# Patient Record
Sex: Female | Born: 1965 | Race: White | Hispanic: No | Smoking: Never smoker
Health system: Southern US, Community
[De-identification: ages and names within clinical notes are randomized; demographics above are authoritative.]

## PROBLEM LIST (undated history)

## (undated) DIAGNOSIS — M810 Age-related osteoporosis without current pathological fracture: Secondary | ICD-10-CM

## (undated) DIAGNOSIS — S069X9A Unspecified intracranial injury with loss of consciousness of unspecified duration, initial encounter: Secondary | ICD-10-CM

## (undated) DIAGNOSIS — Z915 Personal history of self-harm: Secondary | ICD-10-CM

## (undated) DIAGNOSIS — F419 Anxiety disorder, unspecified: Secondary | ICD-10-CM

## (undated) DIAGNOSIS — Z9289 Personal history of other medical treatment: Secondary | ICD-10-CM

## (undated) DIAGNOSIS — F32A Depression, unspecified: Secondary | ICD-10-CM

## (undated) DIAGNOSIS — F319 Bipolar disorder, unspecified: Secondary | ICD-10-CM

## (undated) DIAGNOSIS — N189 Chronic kidney disease, unspecified: Secondary | ICD-10-CM

## (undated) DIAGNOSIS — S069XAA Unspecified intracranial injury with loss of consciousness status unknown, initial encounter: Secondary | ICD-10-CM

## (undated) DIAGNOSIS — F329 Major depressive disorder, single episode, unspecified: Secondary | ICD-10-CM

## (undated) DIAGNOSIS — I669 Occlusion and stenosis of unspecified cerebral artery: Secondary | ICD-10-CM

## (undated) HISTORY — PX: DILATION AND CURETTAGE OF UTERUS: SHX78

## (undated) HISTORY — PX: RHINOPLASTY: SUR1284

## (undated) HISTORY — PX: OTHER SURGICAL HISTORY: SHX169

## (undated) HISTORY — DX: Age-related osteoporosis without current pathological fracture: M81.0

## (undated) HISTORY — PX: SEPTOPLASTY: SUR1290

## (undated) HISTORY — PX: COLONOSCOPY W/ POLYPECTOMY: SHX1380

---

## 1999-05-01 ENCOUNTER — Inpatient Hospital Stay (HOSPITAL_COMMUNITY): Admission: AD | Admit: 1999-05-01 | Discharge: 1999-05-01 | Payer: Self-pay | Admitting: Obstetrics & Gynecology

## 1999-06-04 ENCOUNTER — Inpatient Hospital Stay (HOSPITAL_COMMUNITY): Admission: AD | Admit: 1999-06-04 | Discharge: 1999-06-04 | Payer: Self-pay | Admitting: Obstetrics & Gynecology

## 1999-06-17 ENCOUNTER — Inpatient Hospital Stay (HOSPITAL_COMMUNITY): Admission: AD | Admit: 1999-06-17 | Discharge: 1999-06-19 | Payer: Self-pay | Admitting: *Deleted

## 1999-09-05 ENCOUNTER — Other Ambulatory Visit: Admission: RE | Admit: 1999-09-05 | Discharge: 1999-09-05 | Payer: Self-pay | Admitting: Obstetrics & Gynecology

## 2000-09-04 ENCOUNTER — Other Ambulatory Visit: Admission: RE | Admit: 2000-09-04 | Discharge: 2000-09-04 | Payer: Self-pay | Admitting: Obstetrics & Gynecology

## 2002-01-08 ENCOUNTER — Other Ambulatory Visit: Admission: RE | Admit: 2002-01-08 | Discharge: 2002-01-08 | Payer: Self-pay | Admitting: Obstetrics & Gynecology

## 2003-02-04 ENCOUNTER — Other Ambulatory Visit: Admission: RE | Admit: 2003-02-04 | Discharge: 2003-02-04 | Payer: Self-pay | Admitting: Obstetrics & Gynecology

## 2003-12-14 ENCOUNTER — Inpatient Hospital Stay (HOSPITAL_COMMUNITY): Admission: AD | Admit: 2003-12-14 | Discharge: 2003-12-14 | Payer: Self-pay | Admitting: Obstetrics and Gynecology

## 2003-12-21 ENCOUNTER — Ambulatory Visit (HOSPITAL_COMMUNITY): Admission: RE | Admit: 2003-12-21 | Discharge: 2003-12-21 | Payer: Self-pay | Admitting: Obstetrics & Gynecology

## 2003-12-21 ENCOUNTER — Encounter (INDEPENDENT_AMBULATORY_CARE_PROVIDER_SITE_OTHER): Payer: Self-pay | Admitting: Specialist

## 2004-03-29 ENCOUNTER — Ambulatory Visit (HOSPITAL_COMMUNITY): Admission: RE | Admit: 2004-03-29 | Discharge: 2004-03-29 | Payer: Self-pay | Admitting: Chiropractic Medicine

## 2005-09-27 ENCOUNTER — Encounter: Admission: RE | Admit: 2005-09-27 | Discharge: 2005-09-27 | Payer: Self-pay | Admitting: Obstetrics & Gynecology

## 2007-06-27 DIAGNOSIS — Z9151 Personal history of suicidal behavior: Secondary | ICD-10-CM

## 2007-06-27 HISTORY — DX: Personal history of suicidal behavior: Z91.51

## 2008-05-25 ENCOUNTER — Encounter (INDEPENDENT_AMBULATORY_CARE_PROVIDER_SITE_OTHER): Payer: Self-pay | Admitting: Obstetrics & Gynecology

## 2008-05-25 ENCOUNTER — Ambulatory Visit (HOSPITAL_COMMUNITY): Admission: RE | Admit: 2008-05-25 | Discharge: 2008-05-25 | Payer: Self-pay | Admitting: Obstetrics & Gynecology

## 2009-05-25 ENCOUNTER — Observation Stay (HOSPITAL_COMMUNITY): Admission: EM | Admit: 2009-05-25 | Discharge: 2009-05-28 | Payer: Self-pay | Admitting: Emergency Medicine

## 2009-05-26 ENCOUNTER — Encounter (INDEPENDENT_AMBULATORY_CARE_PROVIDER_SITE_OTHER): Payer: Self-pay | Admitting: Internal Medicine

## 2009-05-28 ENCOUNTER — Ambulatory Visit (HOSPITAL_COMMUNITY): Admission: RE | Admit: 2009-05-28 | Discharge: 2009-05-28 | Payer: Self-pay | Admitting: Psychiatry

## 2009-06-01 ENCOUNTER — Other Ambulatory Visit (HOSPITAL_COMMUNITY): Admission: RE | Admit: 2009-06-01 | Discharge: 2009-06-09 | Payer: Self-pay | Admitting: Psychiatry

## 2009-06-06 ENCOUNTER — Inpatient Hospital Stay (HOSPITAL_COMMUNITY): Admission: EM | Admit: 2009-06-06 | Discharge: 2009-06-10 | Payer: Self-pay | Admitting: Emergency Medicine

## 2009-06-06 ENCOUNTER — Ambulatory Visit: Payer: Self-pay | Admitting: Internal Medicine

## 2009-06-10 ENCOUNTER — Inpatient Hospital Stay (HOSPITAL_COMMUNITY): Admission: AD | Admit: 2009-06-10 | Discharge: 2009-06-12 | Payer: Self-pay | Admitting: Psychiatry

## 2009-06-10 ENCOUNTER — Ambulatory Visit: Payer: Self-pay | Admitting: Psychiatry

## 2010-06-01 ENCOUNTER — Emergency Department (HOSPITAL_COMMUNITY)
Admission: EM | Admit: 2010-06-01 | Discharge: 2010-06-02 | Disposition: A | Discharge status: Other Acute Inpatient Hospital | Payer: Self-pay | Source: Home / Self Care | Admitting: Emergency Medicine

## 2010-06-01 ENCOUNTER — Ambulatory Visit (HOSPITAL_COMMUNITY)
Admission: RE | Admit: 2010-06-01 | Discharge: 2010-06-01 | Payer: Self-pay | Source: Home / Self Care | Attending: Psychiatry | Admitting: Psychiatry

## 2010-06-02 ENCOUNTER — Inpatient Hospital Stay (HOSPITAL_COMMUNITY)
Admission: RE | Admit: 2010-06-02 | Discharge: 2010-06-05 | Payer: Self-pay | Source: Home / Self Care | Attending: Psychiatry | Admitting: Psychiatry

## 2010-09-06 LAB — URINE MICROSCOPIC-ADD ON

## 2010-09-06 LAB — COMPREHENSIVE METABOLIC PANEL
ALT: 17 U/L (ref 0–35)
AST: 22 U/L (ref 0–37)
Albumin: 4.4 g/dL (ref 3.5–5.2)
Alkaline Phosphatase: 89 U/L (ref 39–117)
BUN: 5 mg/dL — ABNORMAL LOW (ref 6–23)
CO2: 30 mEq/L (ref 19–32)
Calcium: 9.4 mg/dL (ref 8.4–10.5)
Chloride: 103 mEq/L (ref 96–112)
Creatinine, Ser: 0.86 mg/dL (ref 0.4–1.2)
GFR calc Af Amer: >60 mL/min (ref >60)
GFR calc non Af Amer: >60 mL/min (ref >60)
Glucose, Bld: 85 mg/dL (ref 70–99)
Potassium: 3.5 mEq/L (ref 3.5–5.1)
Sodium: 142 mEq/L (ref 135–145)
Total Bilirubin: 0.5 mg/dL (ref 0.3–1.2)
Total Protein: 7.5 g/dL (ref 6.0–8.3)

## 2010-09-06 LAB — URINALYSIS, ROUTINE W REFLEX MICROSCOPIC
Bilirubin Urine: NEGATIVE
Glucose, UA: NEGATIVE mg/dL
Ketones, ur: NEGATIVE mg/dL
Nitrite: NEGATIVE
Protein, ur: NEGATIVE mg/dL
Specific Gravity, Urine: 1.004 — ABNORMAL LOW (ref 1.005–1.030)
Urobilinogen, UA: 0.2 mg/dL (ref 0.0–1.0)
pH: 6 (ref 5.0–8.0)

## 2010-09-06 LAB — DIFFERENTIAL
Basophils Absolute: 0.1 10*3/uL (ref 0.0–0.1)
Basophils Relative: 1 % (ref 0–1)
Eosinophils Absolute: 0.1 10*3/uL (ref 0.0–0.7)
Eosinophils Relative: 1 % (ref 0–5)
Lymphocytes Relative: 31 % (ref 12–46)
Lymphs Abs: 2.7 10*3/uL (ref 0.7–4.0)
Monocytes Absolute: 0.6 10*3/uL (ref 0.1–1.0)
Monocytes Relative: 7 % (ref 3–12)
Neutro Abs: 5.2 10*3/uL (ref 1.7–7.7)
Neutrophils Relative %: 60 % (ref 43–77)

## 2010-09-06 LAB — ETHANOL: Alcohol, Ethyl (B): 6 mg/dL (ref 0–10)

## 2010-09-06 LAB — SALICYLATE LEVEL: Salicylate Lvl: <4 mg/dL (ref 2.8–20.0)

## 2010-09-06 LAB — CBC
HCT: 38.1 % (ref 36.0–46.0)
Hemoglobin: 12.9 g/dL (ref 12.0–15.0)
MCH: 29.9 pg (ref 26.0–34.0)
MCHC: 33.9 g/dL (ref 30.0–36.0)
MCV: 88.2 fL (ref 78.0–100.0)
Platelets: 242 10*3/uL (ref 150–400)
RBC: 4.32 MIL/uL (ref 3.87–5.11)
RDW: 13.4 % (ref 11.5–15.5)
WBC: 8.7 10*3/uL (ref 4.0–10.5)

## 2010-09-06 LAB — RAPID URINE DRUG SCREEN, HOSP PERFORMED
Amphetamines: NOT DETECTED
Barbiturates: NOT DETECTED
Benzodiazepines: POSITIVE — AB
Cocaine: NOT DETECTED
Opiates: NOT DETECTED
Tetrahydrocannabinol: NOT DETECTED

## 2010-09-06 LAB — TRICYCLICS SCREEN, URINE: TCA Scrn: NOT DETECTED

## 2010-09-06 LAB — ACETAMINOPHEN LEVEL: Acetaminophen (Tylenol), Serum: <10 ug/mL — ABNORMAL LOW (ref 10–30)

## 2010-09-06 LAB — PREGNANCY, URINE: Preg Test, Ur: NEGATIVE

## 2010-09-26 LAB — TSH: TSH: 1.127 u[IU]/mL (ref 0.350–4.500)

## 2010-09-27 LAB — PROTIME-INR
INR: 1.04 (ref 0.00–1.49)
Prothrombin Time: 13.5 seconds (ref 11.6–15.2)

## 2010-09-27 LAB — CBC
HCT: 32.4 % — ABNORMAL LOW (ref 36.0–46.0)
HCT: 37.5 % (ref 36.0–46.0)
Hemoglobin: 10.8 g/dL — ABNORMAL LOW (ref 12.0–15.0)
Hemoglobin: 12.4 g/dL (ref 12.0–15.0)
MCHC: 33.2 g/dL (ref 30.0–36.0)
MCHC: 33.3 g/dL (ref 30.0–36.0)
MCV: 87.9 fL (ref 78.0–100.0)
MCV: 88 fL (ref 78.0–100.0)
Platelets: 175 10*3/uL (ref 150–400)
Platelets: 181 10*3/uL (ref 150–400)
RBC: 3.69 MIL/uL — ABNORMAL LOW (ref 3.87–5.11)
RBC: 4.26 MIL/uL (ref 3.87–5.11)
RDW: 14.2 % (ref 11.5–15.5)
RDW: 14.3 % (ref 11.5–15.5)
WBC: 10.5 10*3/uL (ref 4.0–10.5)
WBC: 6.2 10*3/uL (ref 4.0–10.5)

## 2010-09-27 LAB — RAPID URINE DRUG SCREEN, HOSP PERFORMED
Amphetamines: NOT DETECTED
Barbiturates: NOT DETECTED
Benzodiazepines: POSITIVE — AB
Cocaine: NOT DETECTED
Opiates: NOT DETECTED
Tetrahydrocannabinol: NOT DETECTED

## 2010-09-27 LAB — DIFFERENTIAL
Basophils Absolute: 0 10*3/uL (ref 0.0–0.1)
Basophils Relative: 0 % (ref 0–1)
Eosinophils Absolute: 0.1 10*3/uL (ref 0.0–0.7)
Eosinophils Relative: 2 % (ref 0–5)
Lymphocytes Relative: 40 % (ref 12–46)
Lymphs Abs: 2.5 10*3/uL (ref 0.7–4.0)
Monocytes Absolute: 0.4 10*3/uL (ref 0.1–1.0)
Monocytes Relative: 7 % (ref 3–12)
Neutro Abs: 3.2 10*3/uL (ref 1.7–7.7)
Neutrophils Relative %: 51 % (ref 43–77)

## 2010-09-27 LAB — BLOOD GAS, ARTERIAL
Acid-Base Excess: 0.1 mmol/L (ref 0.0–2.0)
Acid-Base Excess: 0.5 mmol/L (ref 0.0–2.0)
Bicarbonate: 23.3 mEq/L (ref 20.0–24.0)
Bicarbonate: 23.7 mEq/L (ref 20.0–24.0)
Drawn by: 129801
FIO2: 0.3 %
FIO2: 0.4 %
MECHVT: 450 mL
MECHVT: 500 mL
O2 Saturation: 97.1 %
O2 Saturation: 97.5 %
PEEP: 5 cmH2O
PEEP: 5 cmH2O
Patient temperature: 102
Patient temperature: 98.6
RATE: 14 resp/min
RATE: 15 resp/min
TCO2: 20.5 mmol/L (ref 0–100)
TCO2: 21.3 mmol/L (ref 0–100)
pCO2 arterial: 33.1 mmHg — ABNORMAL LOW (ref 35.0–45.0)
pCO2 arterial: 40.1 mmHg (ref 35.0–45.0)
pH, Arterial: 7.399 (ref 7.350–7.400)
pH, Arterial: 7.461 — ABNORMAL HIGH (ref 7.350–7.400)
pO2, Arterial: 132 mmHg — ABNORMAL HIGH (ref 80.0–100.0)
pO2, Arterial: 144 mmHg — ABNORMAL HIGH (ref 80.0–100.0)

## 2010-09-27 LAB — URINE CULTURE
Colony Count: NO GROWTH
Culture: NO GROWTH
Special Requests: NEGATIVE

## 2010-09-27 LAB — ACTH STIMULATION, 3 TIME POINTS
Cortisol, 30 Min: 20.3 ug/dL (ref >20)
Cortisol, 60 Min: 23.4 ug/dL (ref >20)
Cortisol, Base: 1.1 ug/dL

## 2010-09-27 LAB — HSV PCR
HSV 2 , PCR: NOT DETECTED
HSV, PCR: NOT DETECTED
Specimen Source-HSVPCR: NO GROWTH

## 2010-09-27 LAB — COMPREHENSIVE METABOLIC PANEL
ALT: 26 U/L (ref 0–35)
ALT: 35 U/L (ref 0–35)
AST: 24 U/L (ref 0–37)
AST: 27 U/L (ref 0–37)
Albumin: 3.5 g/dL (ref 3.5–5.2)
Albumin: 4.1 g/dL (ref 3.5–5.2)
Alkaline Phosphatase: 65 U/L (ref 39–117)
Alkaline Phosphatase: 86 U/L (ref 39–117)
BUN: 5 mg/dL — ABNORMAL LOW (ref 6–23)
BUN: 5 mg/dL — ABNORMAL LOW (ref 6–23)
CO2: 27 mEq/L (ref 19–32)
CO2: 28 mEq/L (ref 19–32)
Calcium: 8.6 mg/dL (ref 8.4–10.5)
Calcium: 8.7 mg/dL (ref 8.4–10.5)
Chloride: 105 mEq/L (ref 96–112)
Chloride: 108 mEq/L (ref 96–112)
Creatinine, Ser: 0.62 mg/dL (ref 0.4–1.2)
Creatinine, Ser: 0.71 mg/dL (ref 0.4–1.2)
GFR calc Af Amer: >60 mL/min (ref >60)
GFR calc Af Amer: >60 mL/min (ref >60)
GFR calc non Af Amer: >60 mL/min (ref >60)
GFR calc non Af Amer: >60 mL/min (ref >60)
Glucose, Bld: 121 mg/dL — ABNORMAL HIGH (ref 70–99)
Glucose, Bld: 75 mg/dL (ref 70–99)
Potassium: 2.8 mEq/L — ABNORMAL LOW (ref 3.5–5.1)
Potassium: 3.6 mEq/L (ref 3.5–5.1)
Sodium: 138 mEq/L (ref 135–145)
Sodium: 141 mEq/L (ref 135–145)
Total Bilirubin: 0.5 mg/dL (ref 0.3–1.2)
Total Bilirubin: 0.8 mg/dL (ref 0.3–1.2)
Total Protein: 6.9 g/dL (ref 6.0–8.3)
Total Protein: 7.3 g/dL (ref 6.0–8.3)

## 2010-09-27 LAB — CULTURE, BLOOD (ROUTINE X 2)
Culture: NO GROWTH
Culture: NO GROWTH

## 2010-09-27 LAB — URINALYSIS, ROUTINE W REFLEX MICROSCOPIC
Bilirubin Urine: NEGATIVE
Glucose, UA: NEGATIVE mg/dL
Ketones, ur: NEGATIVE mg/dL
Leukocytes, UA: NEGATIVE
Nitrite: NEGATIVE
Protein, ur: NEGATIVE mg/dL
Specific Gravity, Urine: 1.01 (ref 1.005–1.030)
Urobilinogen, UA: 0.2 mg/dL (ref 0.0–1.0)
pH: 7.5 (ref 5.0–8.0)

## 2010-09-27 LAB — ETHANOL: Alcohol, Ethyl (B): <5 mg/dL (ref 0–10)

## 2010-09-27 LAB — CSF CULTURE
Culture: NO GROWTH
Gram Stain: NONE SEEN

## 2010-09-27 LAB — CSF CULTURE W GRAM STAIN

## 2010-09-27 LAB — POCT CARDIAC MARKERS
CKMB, poc: <1 ng/mL — ABNORMAL LOW (ref 1.0–8.0)
Myoglobin, poc: 62.3 ng/mL (ref 12–200)
Troponin i, poc: <0.05 ng/mL (ref 0.00–0.09)

## 2010-09-27 LAB — CSF CELL COUNT WITH DIFFERENTIAL
RBC Count, CSF: 1 /mm3 — ABNORMAL HIGH
Tube #: 1
WBC, CSF: 1 /mm3 (ref 0–5)

## 2010-09-27 LAB — BASIC METABOLIC PANEL
BUN: 5 mg/dL — ABNORMAL LOW (ref 6–23)
CO2: 29 mEq/L (ref 19–32)
Calcium: 8.7 mg/dL (ref 8.4–10.5)
Chloride: 103 mEq/L (ref 96–112)
Creatinine, Ser: 0.65 mg/dL (ref 0.4–1.2)
GFR calc Af Amer: >60 mL/min (ref >60)
GFR calc non Af Amer: >60 mL/min (ref >60)
Glucose, Bld: 122 mg/dL — ABNORMAL HIGH (ref 70–99)
Potassium: 3 mEq/L — ABNORMAL LOW (ref 3.5–5.1)
Sodium: 138 mEq/L (ref 135–145)

## 2010-09-27 LAB — PROTEIN AND GLUCOSE, CSF
Glucose, CSF: 55 mg/dL (ref 43–76)
Total  Protein, CSF: 49 mg/dL — ABNORMAL HIGH (ref 15–45)

## 2010-09-27 LAB — ACETAMINOPHEN LEVEL
Acetaminophen (Tylenol), Serum: 12.7 ug/mL (ref 10–30)
Acetaminophen (Tylenol), Serum: 21.2 ug/mL (ref 10–30)
Acetaminophen (Tylenol), Serum: 33 ug/mL — ABNORMAL HIGH (ref 10–30)

## 2010-09-27 LAB — AMMONIA: Ammonia: 17 umol/L (ref 11–35)

## 2010-09-27 LAB — SALICYLATE LEVEL: Salicylate Lvl: <4 mg/dL (ref 2.8–20.0)

## 2010-09-27 LAB — APTT: aPTT: 31 seconds (ref 24–37)

## 2010-09-27 LAB — CARDIAC PANEL(CRET KIN+CKTOT+MB+TROPI)
CK, MB: 0.7 ng/mL (ref 0.3–4.0)
Relative Index: INVALID (ref 0.0–2.5)
Total CK: 65 U/L (ref 7–177)
Troponin I: <0.01 ng/mL (ref 0.00–0.06)

## 2010-09-27 LAB — GLUCOSE, CAPILLARY: Glucose-Capillary: 97 mg/dL (ref 70–99)

## 2010-09-27 LAB — URINE MICROSCOPIC-ADD ON

## 2010-09-27 LAB — MAGNESIUM: Magnesium: 2.1 mg/dL (ref 1.5–2.5)

## 2010-09-27 LAB — PREGNANCY, URINE: Preg Test, Ur: NEGATIVE

## 2010-09-27 LAB — VDRL, CSF: VDRL Quant, CSF: NONREACTIVE

## 2010-09-27 LAB — HSV(HERPES SMPLX VRS)ABS-I+II(IGG)-CSF

## 2010-09-28 LAB — COMPREHENSIVE METABOLIC PANEL
ALT: 18 U/L (ref 0–35)
AST: 25 U/L (ref 0–37)
Albumin: 4.1 g/dL (ref 3.5–5.2)
Alkaline Phosphatase: 87 U/L (ref 39–117)
BUN: 5 mg/dL — ABNORMAL LOW (ref 6–23)
CO2: 28 mEq/L (ref 19–32)
Calcium: 9.1 mg/dL (ref 8.4–10.5)
Chloride: 104 mEq/L (ref 96–112)
Creatinine, Ser: 0.66 mg/dL (ref 0.4–1.2)
GFR calc Af Amer: >60 mL/min (ref >60)
GFR calc non Af Amer: >60 mL/min (ref >60)
Glucose, Bld: 105 mg/dL — ABNORMAL HIGH (ref 70–99)
Potassium: 3.7 mEq/L (ref 3.5–5.1)
Sodium: 138 mEq/L (ref 135–145)
Total Bilirubin: 0.5 mg/dL (ref 0.3–1.2)
Total Protein: 7.2 g/dL (ref 6.0–8.3)

## 2010-09-28 LAB — DIFFERENTIAL
Basophils Absolute: 0 10*3/uL (ref 0.0–0.1)
Basophils Relative: 0 % (ref 0–1)
Eosinophils Absolute: 0.1 10*3/uL (ref 0.0–0.7)
Eosinophils Relative: 3 % (ref 0–5)
Lymphocytes Relative: 42 % (ref 12–46)
Lymphs Abs: 1.9 10*3/uL (ref 0.7–4.0)
Monocytes Absolute: 0.3 10*3/uL (ref 0.1–1.0)
Monocytes Relative: 7 % (ref 3–12)
Neutro Abs: 2.2 10*3/uL (ref 1.7–7.7)
Neutrophils Relative %: 49 % (ref 43–77)

## 2010-09-28 LAB — CBC
HCT: 36.2 % (ref 36.0–46.0)
Hemoglobin: 12.3 g/dL (ref 12.0–15.0)
MCHC: 33.9 g/dL (ref 30.0–36.0)
MCV: 87.8 fL (ref 78.0–100.0)
Platelets: 215 10*3/uL (ref 150–400)
RBC: 4.13 MIL/uL (ref 3.87–5.11)
RDW: 14.1 % (ref 11.5–15.5)
WBC: 4.6 10*3/uL (ref 4.0–10.5)

## 2010-09-28 LAB — URINALYSIS, ROUTINE W REFLEX MICROSCOPIC
Bilirubin Urine: NEGATIVE
Glucose, UA: NEGATIVE mg/dL
Ketones, ur: NEGATIVE mg/dL
Nitrite: NEGATIVE
Protein, ur: NEGATIVE mg/dL
Specific Gravity, Urine: 1.008 (ref 1.005–1.030)
Urobilinogen, UA: 0.2 mg/dL (ref 0.0–1.0)
pH: 8 (ref 5.0–8.0)

## 2010-09-28 LAB — TSH: TSH: 1.444 u[IU]/mL (ref 0.350–4.500)

## 2010-09-28 LAB — RAPID URINE DRUG SCREEN, HOSP PERFORMED
Amphetamines: NOT DETECTED
Barbiturates: NOT DETECTED
Benzodiazepines: POSITIVE — AB
Cocaine: NOT DETECTED
Opiates: NOT DETECTED
Tetrahydrocannabinol: NOT DETECTED

## 2010-09-28 LAB — CARDIAC PANEL(CRET KIN+CKTOT+MB+TROPI)
CK, MB: 0.7 ng/mL (ref 0.3–4.0)
Relative Index: INVALID (ref 0.0–2.5)
Total CK: 69 U/L (ref 7–177)
Troponin I: 0.02 ng/mL (ref 0.00–0.06)

## 2010-09-28 LAB — CORTISOL: Cortisol, Plasma: 3.6 ug/dL

## 2010-09-28 LAB — URINE MICROSCOPIC-ADD ON

## 2010-09-28 LAB — CARBAMAZEPINE LEVEL, TOTAL: Carbamazepine Lvl: <2 ug/mL — ABNORMAL LOW (ref 4.0–12.0)

## 2010-09-28 LAB — VITAMIN D 1,25 DIHYDROXY
Vitamin D 1, 25 (OH)2 Total: 60 pg/mL (ref 18–72)
Vitamin D2 1, 25 (OH)2: <8 pg/mL
Vitamin D3 1, 25 (OH)2: 60 pg/mL

## 2010-09-28 LAB — POCT CARDIAC MARKERS
CKMB, poc: <1 ng/mL — ABNORMAL LOW (ref 1.0–8.0)
Myoglobin, poc: 59.4 ng/mL (ref 12–200)
Troponin i, poc: <0.05 ng/mL (ref 0.00–0.09)

## 2010-09-28 LAB — ETHANOL: Alcohol, Ethyl (B): <5 mg/dL (ref 0–10)

## 2010-09-28 LAB — APTT: aPTT: 29 seconds (ref 24–37)

## 2010-09-28 LAB — PROTIME-INR
INR: 1.03 (ref 0.00–1.49)
Prothrombin Time: 13.4 seconds (ref 11.6–15.2)

## 2010-11-08 NOTE — Op Note (Signed)
Laurie Booker, Laurie Booker                ACCOUNT NO.:  [id number]   MEDICAL RECORD NO.:  [id number]          PATIENT TYPE:  AMB   LOCATION:                                FACILITY:  [facility]   PHYSICIAN:  Genia Del, M.D.DATE OF BIRTH:  [birth year]   DATE OF PROCEDURE:  DATE OF DISCHARGE:                               OPERATIVE REPORT   PREOPERATIVE DIAGNOSIS:  Menometrorrhagia refractory to Mirena  intrauterine device.   POSTOPERATIVE DIAGNOSIS:  Menometrorrhagia refractory to Mirena  intrauterine device.   PROCEDURE:  Mirena IUD removal, diagnostic hysteroscopy, dilatation and  curettage, NovaSure endometrial ablation.   SURGEON:  Genia Del, MD   No assistant.   PROCEDURE:  Under general anesthesia with endotracheal intubation, the  patient in lithotomy position, she was prepped with Betadine on the  suprapubic, vulvar, and vaginal areas and draped as usual.  The bladder  was catheterized.  The vaginal exam under general anesthesia reveals a  normal-size anteverted uterus, mobile, no adnexal mass.  The cervix was  long and closed.  The speculum was introduced in the vagina.  The  anterior lip of the cervix was grasped with a tenaculum.  The Mirena IUD  was pulled out easily and will be sent to pathology with the endometrial  curettings.  We proceeded with a paracervical block with Nesacaine 1%, a  total of 10 mL at 4 and 8 o'clock.  The measurements of the cervical  length is at 4 cm, the hysterometer 8 cm for an uterine cavity length of  4 cm.  We then proceeded with dilatation of the cervix with Hegar  dilators up to #31 without difficulty.  A diagnostic hysteroscopy was  done.  The hysteroscope was introduced in the intrauterine cavity.  No  lesions were seen.  We visualized both ostia well.  Pictures were taken.  The hysteroscope was then removed.  We proceeded with the curettage of  the endometrial cavity.  This was done with a sharp curette  systematically on all  intrauterine surfaces.  The specimen was sent to  pathology as mentioned previously with the Mirena IUD.  We then  introduced the NovaSure device in the intrauterine cavity.  We measured  the uterine width which is at 2.6 cm.  We then did the integrity test  which was passed and then proceed with the NovaSure endometrial  ablation.  A power of 57 for a duration of 1 minute 58 seconds was done.  The instrument was then removed easily.  The tenaculum was also removed.  Silver nitrate was used at that level to complete hemostasis.  The  speculum was then removed.  The fluid deficit was 100 mL.  The estimated  blood loss was minimal.  No complications occurred, and the patient was  brought to recovery room in good stable status.      Genia Del, M.D.  Electronically Signed     ML/MEDQ  D:  05/25/2008  T:  05/26/2008  Job:  [id number]

## 2010-11-11 NOTE — Op Note (Signed)
NAMETYERRA, LORETTO                            ACCOUNT NO.:  [id number]   MEDICAL RECORD NO.:  [id number]                   PATIENT TYPE:  AMB   LOCATION:  [facility]                                  FACILITY:  [facility]   PHYSICIAN:  Genia Del, M.D.             DATE OF BIRTH:  [birth year]   DATE OF PROCEDURE:  12/21/2003  DATE OF DISCHARGE:                                 OPERATIVE REPORT   PREOPERATIVE DIAGNOSES:  1. Menometrorrhagia with secondary anemia.  2. Probable intrauterine polyp.   POSTOPERATIVE DIAGNOSES:  1. Menometrorrhagia with secondary anemia.  2. Probable intrauterine polyp.   </INTERVENTION  Hysteroscopy with resection of endometrial polyp and dilatation and  curettage.   SURGEON:  Genia Del, M.D.   ANESTHESIOLOGIST:  Octaviano Glow. Pamalee Leyden, M.D.   PROCEDURE:  Under MAC analgesia, the patient is in lithotomy position.  She  is prepped with Hibiclens on the suprapubic, vulvar, and vaginal areas and  draped as usual.  The vaginal exam reveals an anteverted uterus, normal  volume, mobile, no adnexal mass.  The cervix is long and closed.  Old blood  is present, no active bleeding.  The speculum is introduced inside the  vagina, the anterior lip of the cervix is grasped with a tenaculum.  A  paracervical block is done with 10 mL of Nesacaine 1% at 4 o'clock and 10 mL  at 8 o'clock and then an additional 4 mL is injected in the cervix itself at  4 and 8 o'clock.  We then proceed with dilatation of the cervix at Hegar #25  easily.  A diagnostic hysteroscopy is then done.  Small polyps are seen on  the left side of the intrauterine cavity.  The diagnostic hysteroscope is  then removed.  Dilatation is completed up to dilator #31 without difficulty.  Then the resectoscope is introduced inside the intrauterine cavity with the  double loop.  We proceed with pictures of the intrauterine cavity and the  endometrial polyps.  We then resect them with the loop and send them to  pathology.  Pictures are taken after.  The cavity is regular and normal  after resection.  Hemostasis is adequate.  We remove the hysteroscope and  use a sharp curette to systematically curettage all intrauterine surfaces.  This specimen is sent to pathology.  Hemostasis is adequate.  The tenaculum  is removed on the anterior lip of the cervix.  Silver nitrate is used to  complete hemostasis at that level.  The rest of the instruments are removed.  The estimated blood loss was minimal.  Deficit was 40 mL.  No complication  occurred, and the patient was transferred to the recovery room in good  status.  Genia Del, M.D.    ML/MEDQ  D:  12/21/2003  T:  12/22/2003  Job:  [id number]

## 2010-11-11 NOTE — Discharge Summary (Signed)
[facility]  PatientDORNA Booker                          MRN: [id number] Adm. Date:  [id number] Disc. Date: [id number] Attending:  Ardeen Fillers                           Discharge Summary  DISCHARGE DIAGNOSES:          1. Intrauterine pregnancy at term, delivered.                               2. Previous cesarean section.                               3. Advanced maternal age.                               4. Rh positive.                               5. Moderate meconium.  PROCEDURE:                    Spontaneous vaginal delivery (VBAC), repair of midline episiotomy, Pitocin augmentation, and amniotomy on June 17, 1999.  HISTORY OF PRESENT ILLNESS:   A 45 year old woman, gravida 2, para 1, EDC June 25, 1999, admitted at 38+ weeks gestational age after presenting to the MAU with regular uterine contractions.  The patient was a VBAC candidate and had been counseled regarding the benefits,  risks, and options prior to admission.  Her original cesarean section was for breech.  HOSPITAL COURSE:              She was admitted in early labor with cervix fingertip.  She had been contracting since the night before and was very uncomfortable.  Early epidural was given.  Amniotomy was performed for moderate  meconium stained fluid.  Pitocin augmentation was necessary because of inadequate uterine contraction pattern.  Amnioinfusion was initiated.  Labor progressed well with a second stage of 42 minutes.  She was delivered spontaneously of a female, 160 grams with Apgars of 9 and 9 over a midline episiotomy.  The baby was DeLee suctioned at the time of delivery.  The pediatrics team was in attendance. Estimated blood loss was 450 cc.  The episiotomy was repaired without difficulty. Arterial cord pH was 7.25.  The patients postpartum course was unremarkable.  Her hemoglobin stabilized at 10.8.  This was well tolerated.  She is  discharged to home in satisfactory condition on the second postpartum day.  She was given routine status post vaginal delivery instructions.  DISCHARGE MEDICATIONS:        1. Prenatal vitamins.                               2. Tylox 20 tablets.                               3. Nonsteroidal anti-inflammatory agents.  FOLLOW-UP:  She will be followed up by the [facility] in four to six weeks. DD:  07/16/99 TD:  07/19/99 Job: [id number]

## 2011-03-29 LAB — CBC
HCT: 34.2 — ABNORMAL LOW
Hemoglobin: 11.6 — ABNORMAL LOW
MCHC: 34
MCV: 89.3
Platelets: 203
RBC: 3.83 — ABNORMAL LOW
RDW: 14.1
WBC: 6.9

## 2011-03-29 LAB — PREGNANCY, URINE: Preg Test, Ur: NEGATIVE

## 2015-01-31 ENCOUNTER — Emergency Department (HOSPITAL_COMMUNITY): Payer: PRIVATE HEALTH INSURANCE

## 2015-01-31 ENCOUNTER — Inpatient Hospital Stay (HOSPITAL_COMMUNITY)
Admission: EM | Admit: 2015-01-31 | Discharge: 2015-02-05 | DRG: 083 | Disposition: A | Discharge status: Home / Self Care | Payer: PRIVATE HEALTH INSURANCE | Attending: Surgery | Admitting: Surgery

## 2015-01-31 ENCOUNTER — Inpatient Hospital Stay (HOSPITAL_COMMUNITY): Payer: PRIVATE HEALTH INSURANCE

## 2015-01-31 DIAGNOSIS — S02402A Zygomatic fracture, unspecified, initial encounter for closed fracture: Secondary | ICD-10-CM | POA: Diagnosis present

## 2015-01-31 DIAGNOSIS — T1490 Injury, unspecified: Secondary | ICD-10-CM | POA: Diagnosis present

## 2015-01-31 DIAGNOSIS — S064X9A Epidural hemorrhage with loss of consciousness of unspecified duration, initial encounter: Secondary | ICD-10-CM | POA: Diagnosis present

## 2015-01-31 DIAGNOSIS — S065X9A Traumatic subdural hemorrhage with loss of consciousness of unspecified duration, initial encounter: Secondary | ICD-10-CM | POA: Diagnosis present

## 2015-01-31 DIAGNOSIS — S02401A Maxillary fracture, unspecified, initial encounter for closed fracture: Secondary | ICD-10-CM | POA: Diagnosis present

## 2015-01-31 DIAGNOSIS — S0219XA Other fracture of base of skull, initial encounter for closed fracture: Secondary | ICD-10-CM | POA: Diagnosis present

## 2015-01-31 DIAGNOSIS — S06309A Unspecified focal traumatic brain injury with loss of consciousness of unspecified duration, initial encounter: Secondary | ICD-10-CM

## 2015-01-31 DIAGNOSIS — R32 Unspecified urinary incontinence: Secondary | ICD-10-CM | POA: Diagnosis present

## 2015-01-31 DIAGNOSIS — S0990XA Unspecified injury of head, initial encounter: Secondary | ICD-10-CM

## 2015-01-31 DIAGNOSIS — F10129 Alcohol abuse with intoxication, unspecified: Secondary | ICD-10-CM | POA: Diagnosis present

## 2015-01-31 DIAGNOSIS — S064XAA Epidural hemorrhage with loss of consciousness status unknown, initial encounter: Secondary | ICD-10-CM

## 2015-01-31 DIAGNOSIS — S066X9A Traumatic subarachnoid hemorrhage with loss of consciousness of unspecified duration, initial encounter: Secondary | ICD-10-CM | POA: Diagnosis present

## 2015-01-31 DIAGNOSIS — W1789XA Other fall from one level to another, initial encounter: Secondary | ICD-10-CM | POA: Diagnosis present

## 2015-01-31 DIAGNOSIS — I609 Nontraumatic subarachnoid hemorrhage, unspecified: Secondary | ICD-10-CM

## 2015-01-31 DIAGNOSIS — Z9911 Dependence on respirator [ventilator] status: Secondary | ICD-10-CM | POA: Diagnosis not present

## 2015-01-31 DIAGNOSIS — Y908 Blood alcohol level of 240 mg/100 ml or more: Secondary | ICD-10-CM | POA: Diagnosis present

## 2015-01-31 DIAGNOSIS — J9690 Respiratory failure, unspecified, unspecified whether with hypoxia or hypercapnia: Secondary | ICD-10-CM

## 2015-01-31 DIAGNOSIS — R159 Full incontinence of feces: Secondary | ICD-10-CM | POA: Diagnosis present

## 2015-01-31 DIAGNOSIS — S069XAA Unspecified intracranial injury with loss of consciousness status unknown, initial encounter: Secondary | ICD-10-CM

## 2015-01-31 DIAGNOSIS — T1490XA Injury, unspecified, initial encounter: Secondary | ICD-10-CM

## 2015-01-31 DIAGNOSIS — S069X9A Unspecified intracranial injury with loss of consciousness of unspecified duration, initial encounter: Secondary | ICD-10-CM

## 2015-01-31 DIAGNOSIS — R509 Fever, unspecified: Secondary | ICD-10-CM

## 2015-01-31 DIAGNOSIS — S0291XA Unspecified fracture of skull, initial encounter for closed fracture: Secondary | ICD-10-CM

## 2015-01-31 LAB — CBC
HCT: 32.7 % — ABNORMAL LOW (ref 36.0–46.0)
Hemoglobin: 10.7 g/dL — ABNORMAL LOW (ref 12.0–15.0)
MCH: 29.2 pg (ref 26.0–34.0)
MCHC: 32.7 g/dL (ref 30.0–36.0)
MCV: 89.3 fL (ref 78.0–100.0)
Platelets: 246 10*3/uL (ref 150–400)
RBC: 3.66 MIL/uL — ABNORMAL LOW (ref 3.87–5.11)
RDW: 13.2 % (ref 11.5–15.5)
WBC: 10.2 10*3/uL (ref 4.0–10.5)

## 2015-01-31 LAB — ETHANOL: Alcohol, Ethyl (B): 301 mg/dL (ref <5)

## 2015-01-31 LAB — URINALYSIS, ROUTINE W REFLEX MICROSCOPIC
Bilirubin Urine: NEGATIVE
Glucose, UA: NEGATIVE mg/dL
Ketones, ur: NEGATIVE mg/dL
Leukocytes, UA: NEGATIVE
Nitrite: NEGATIVE
Protein, ur: NEGATIVE mg/dL
Specific Gravity, Urine: 1.003 — ABNORMAL LOW (ref 1.005–1.030)
Urobilinogen, UA: 0.2 mg/dL (ref 0.0–1.0)
pH: 6.5 (ref 5.0–8.0)

## 2015-01-31 LAB — I-STAT ARTERIAL BLOOD GAS, ED
Acid-base deficit: 2 mmol/L (ref 0.0–2.0)
Bicarbonate: 22.3 mEq/L (ref 20.0–24.0)
O2 Saturation: 100 %
Patient temperature: 95.1
TCO2: 23 mmol/L (ref 0–100)
pCO2 arterial: 31.5 mmHg — ABNORMAL LOW (ref 35.0–45.0)
pH, Arterial: 7.45 (ref 7.350–7.450)
pO2, Arterial: 400 mmHg — ABNORMAL HIGH (ref 80.0–100.0)

## 2015-01-31 LAB — COMPREHENSIVE METABOLIC PANEL
ALT: 16 U/L (ref 14–54)
AST: 23 U/L (ref 15–41)
Albumin: 4.1 g/dL (ref 3.5–5.0)
Alkaline Phosphatase: 63 U/L (ref 38–126)
Anion gap: 12 (ref 5–15)
BUN: 9 mg/dL (ref 6–20)
CO2: 21 mmol/L — ABNORMAL LOW (ref 22–32)
Calcium: 8.7 mg/dL — ABNORMAL LOW (ref 8.9–10.3)
Chloride: 100 mmol/L — ABNORMAL LOW (ref 101–111)
Creatinine, Ser: 0.71 mg/dL (ref 0.44–1.00)
GFR calc Af Amer: >60 mL/min (ref >60)
GFR calc non Af Amer: >60 mL/min (ref >60)
Glucose, Bld: 163 mg/dL — ABNORMAL HIGH (ref 65–99)
Potassium: 2.8 mmol/L — ABNORMAL LOW (ref 3.5–5.1)
Sodium: 133 mmol/L — ABNORMAL LOW (ref 135–145)
Total Bilirubin: 0.5 mg/dL (ref 0.3–1.2)
Total Protein: 6.9 g/dL (ref 6.5–8.1)

## 2015-01-31 LAB — MRSA PCR SCREENING: MRSA by PCR: NEGATIVE

## 2015-01-31 LAB — PROTIME-INR
INR: 1.05 (ref 0.00–1.49)
Prothrombin Time: 13.9 seconds (ref 11.6–15.2)

## 2015-01-31 LAB — URINE MICROSCOPIC-ADD ON

## 2015-01-31 LAB — CDS SEROLOGY

## 2015-01-31 LAB — SAMPLE TO BLOOD BANK

## 2015-01-31 MED ORDER — ETOMIDATE 2 MG/ML IV SOLN
INTRAVENOUS | Status: AC
  Administered 2015-01-31: 20 mg
  Filled 2015-01-31: qty 20

## 2015-01-31 MED ORDER — DOCUSATE SODIUM 100 MG PO CAPS
100.0000 mg | ORAL_CAPSULE | Freq: Two times a day (BID) | ORAL | Status: DC
  Administered 2015-02-01 – 2015-02-04 (×7): 100 mg via ORAL
  Filled 2015-01-31 (×11): qty 1

## 2015-01-31 MED ORDER — LIDOCAINE HCL (CARDIAC) 20 MG/ML IV SOLN
INTRAVENOUS | Status: AC
  Filled 2015-01-31: qty 5

## 2015-01-31 MED ORDER — ONDANSETRON HCL 4 MG/2ML IJ SOLN
INTRAMUSCULAR | Status: AC
  Administered 2015-01-31: 4 mg
  Filled 2015-01-31: qty 2

## 2015-01-31 MED ORDER — MORPHINE SULFATE 4 MG/ML IJ SOLN
INTRAMUSCULAR | Status: AC
  Administered 2015-01-31: 4 mg
  Filled 2015-01-31: qty 1

## 2015-01-31 MED ORDER — PROPOFOL 1000 MG/100ML IV EMUL
5.0000 ug/kg/min | Freq: Once | INTRAVENOUS | Status: DC
  Administered 2015-01-31: 10 ug/kg/min via INTRAVENOUS

## 2015-01-31 MED ORDER — ACETAMINOPHEN 325 MG PO TABS
325.0000 mg | ORAL_TABLET | Freq: Four times a day (QID) | ORAL | Status: DC | PRN
  Administered 2015-02-01 – 2015-02-04 (×3): 650 mg via ORAL
  Filled 2015-01-31 (×3): qty 2

## 2015-01-31 MED ORDER — MIDAZOLAM HCL 2 MG/2ML IJ SOLN
2.0000 mg | INTRAMUSCULAR | Status: DC | PRN
  Administered 2015-01-31 – 2015-02-01 (×9): 2 mg via INTRAVENOUS
  Filled 2015-01-31 (×8): qty 2

## 2015-01-31 MED ORDER — MORPHINE SULFATE 2 MG/ML IJ SOLN
1.0000 mg | INTRAMUSCULAR | Status: DC | PRN
  Administered 2015-01-31: 2 mg via INTRAVENOUS
  Administered 2015-02-01 (×2): 1 mg via INTRAVENOUS
  Administered 2015-02-01 (×2): 2 mg via INTRAVENOUS
  Filled 2015-01-31 (×5): qty 1

## 2015-01-31 MED ORDER — PROPOFOL 1000 MG/100ML IV EMUL
INTRAVENOUS | Status: AC
  Administered 2015-01-31: 10 ug/kg/min via INTRAVENOUS
  Filled 2015-01-31: qty 100

## 2015-01-31 MED ORDER — ONDANSETRON HCL 4 MG/2ML IJ SOLN
4.0000 mg | Freq: Four times a day (QID) | INTRAMUSCULAR | Status: DC | PRN
  Administered 2015-02-01 (×2): 4 mg via INTRAVENOUS
  Filled 2015-01-31 (×3): qty 2

## 2015-01-31 MED ORDER — PANTOPRAZOLE SODIUM 40 MG PO TBEC
40.0000 mg | DELAYED_RELEASE_TABLET | Freq: Every day | ORAL | Status: DC
  Administered 2015-02-02 – 2015-02-04 (×3): 40 mg via ORAL
  Filled 2015-01-31 (×3): qty 1

## 2015-01-31 MED ORDER — AMMONIA AROMATIC IN INHA
RESPIRATORY_TRACT | Status: AC
  Filled 2015-01-31: qty 10

## 2015-01-31 MED ORDER — ANTISEPTIC ORAL RINSE SOLUTION (CORINZ)
7.0000 mL | Freq: Four times a day (QID) | OROMUCOSAL | Status: DC
Start: 2015-01-31 — End: 2015-02-01
  Administered 2015-01-31 – 2015-02-01 (×4): 7 mL via OROMUCOSAL

## 2015-01-31 MED ORDER — PROPOFOL 1000 MG/100ML IV EMUL
5.0000 ug/kg/min | INTRAVENOUS | Status: DC
  Administered 2015-01-31: 50 ug/kg/min via INTRAVENOUS
  Administered 2015-01-31: 25 ug/kg/min via INTRAVENOUS
  Administered 2015-01-31 – 2015-02-01 (×3): 50 ug/kg/min via INTRAVENOUS
  Filled 2015-01-31 (×5): qty 100

## 2015-01-31 MED ORDER — SUCCINYLCHOLINE CHLORIDE 20 MG/ML IJ SOLN
INTRAMUSCULAR | Status: AC
  Filled 2015-01-31: qty 1

## 2015-01-31 MED ORDER — MIDAZOLAM HCL 2 MG/2ML IJ SOLN
INTRAMUSCULAR | Status: AC
  Filled 2015-01-31: qty 2

## 2015-01-31 MED ORDER — ROCURONIUM BROMIDE 50 MG/5ML IV SOLN
INTRAVENOUS | Status: AC
  Administered 2015-01-31: 10 mg
  Filled 2015-01-31: qty 2

## 2015-01-31 MED ORDER — ONDANSETRON HCL 4 MG PO TABS: 4.0000 mg | ORAL_TABLET | Freq: Four times a day (QID) | ORAL | Status: DC | PRN

## 2015-01-31 MED ORDER — PANTOPRAZOLE SODIUM 40 MG IV SOLR
40.0000 mg | Freq: Every day | INTRAVENOUS | Status: DC
  Administered 2015-01-31 – 2015-02-01 (×2): 40 mg via INTRAVENOUS
  Filled 2015-01-31 (×4): qty 40

## 2015-01-31 MED ORDER — CHLORHEXIDINE GLUCONATE 0.12% ORAL RINSE (MEDLINE KIT)
15.0000 mL | Freq: Two times a day (BID) | OROMUCOSAL | Status: DC
  Administered 2015-01-31 – 2015-02-01 (×3): 15 mL via OROMUCOSAL

## 2015-01-31 MED ORDER — KCL IN DEXTROSE-NACL 30-5-0.45 MEQ/L-%-% IV SOLN
INTRAVENOUS | Status: DC
  Administered 2015-01-31: 09:00:00 via INTRAVENOUS
  Administered 2015-01-31: 75 mL/h via INTRAVENOUS
  Administered 2015-02-01 – 2015-02-02 (×2): via INTRAVENOUS
  Filled 2015-01-31 (×11): qty 1000

## 2015-01-31 NOTE — ED Notes (Signed)
MD Byerly at bedside to examine patient.

## 2015-01-31 NOTE — ED Notes (Addendum)
Dr. Wilkie Aye decided to move pt to trauma B for intubation due to pt becoming unresponsive and vomiting.  Pt was unresponsive to ammonia tab.

## 2015-01-31 NOTE — Progress Notes (Signed)
Subjective: Patient in ICU, on the vent via endotracheal tube. Sedated with propofol drip and when necessary Versed. CT of the brain repeated 5 and half hours after initial CT, and shows stability of initial traumatic findings including no change in right temporal fossa epidural hematoma as well as no change in traumatic subarachnoid hemorrhage.  Objective: Vital signs in last 24 hours: Filed Vitals:   01/31/15 0745 01/31/15 0813 01/31/15 0900 01/31/15 1000  BP: 106/62  105/59 95/56  Pulse: 84  82 84  Temp:  99.5 F (37.5 C)    TempSrc:  Oral    Resp: 16  16 16   Height:  5\' 1"  (1.549 m)    Weight:  55.9 kg (123 lb 3.8 oz)    SpO2: 100%  100% 100%    Intake/Output from previous day: 08/06 0701 - 08/07 0700 In: -  Out: 1500 [Urine:1500] Intake/Output this shift: Total I/O In: 93.5 [I.V.:93.5] Out: 650 [Urine:650]  Physical Exam:  Patient localizing, moving all 4 extremities vigorously, but not following commands. Not opening eyes to voice or pain, but forcefully closing them during examination. Pupils 4 mm bilaterally, round, reactive to light.  CBC  Recent Labs  01/31/15 0324  WBC 10.2  HGB 10.7*  HCT 32.7*  PLT 246   BMET  Recent Labs  01/31/15 0324  NA 133*  K 2.8*  CL 100*  CO2 21*  GLUCOSE 163*  BUN 9  CREATININE 0.71  CALCIUM 8.7*   ABG    Component Value Date/Time   PHART 7.450 01/31/2015 0355   PCO2ART 31.5* 01/31/2015 0355   PO2ART 400.0* 01/31/2015 0355   HCO3 22.3 01/31/2015 0355   TCO2 23 01/31/2015 0355   ACIDBASEDEF 2.0 01/31/2015 0355   O2SAT 100.0 01/31/2015 0355    Studies/Results: Ct Head Without Contrast  01/31/2015   CLINICAL DATA:  Fall. Follow-up traumatic epidural hematoma and subarachnoid hemorrhage.  EXAM: CT HEAD WITHOUT CONTRAST  TECHNIQUE: Contiguous axial images were obtained from the base of the skull through the vertex without intravenous contrast.  COMPARISON:  CT 01/31/2015.  FINDINGS: Acute epidural hematoma anteriorly  in the right middle cranial fossa has not significantly changed, measuring 2.0 x 2.3 cm on image 10. Diffuse subarachnoid hemorrhage within the basilar cisterns, right sylvian fissure and interhemispheric fissure is unchanged. There is probably a small amount of hemorrhagic contusion inferiorly in the right frontal lobe, measuring 12 mm on image 14. Minimal right to left midline shift of 3 mm is stable. There is no evidence of hydrocephalus or infarct.  Comminuted fracture of the squamous portion of the right temporal bone demonstrates up to 4 mm of depression, unchanged. There is adjacent pneumocephalus. Fractures extend into the roof of the right orbit and planum sphenoidale. There are additional nondisplaced fractures involving the lateral wall of the right orbit, the right zygomatic arch and both nasal bones. The orbital apex is intact. The fracture may extend across the hard palate, although is nondisplaced. The pterygoid plates are intact.  The mastoid air cells and middle ears are clear. There is no displacement of the carotid canals. There is fluid throughout the paranasal sinuses.  IMPRESSION: 1. Stable epidural hematoma anteriorly in the right middle cranial fossa. Stable subarachnoid hemorrhage and right frontal lobe hemorrhagic contusion. 2. No evidence of hydrocephalus or infarct. 3. Stable skullbase, facial and right temporal fractures.   Electronically Signed   By: Carey Bullocks M.D.   On: 01/31/2015 10:11   Ct Head Wo Contrast  01/31/2015  CLINICAL DATA:  Larey Seat.  Unresponsive.  EXAM: CT HEAD WITHOUT CONTRAST  CT CERVICAL SPINE WITHOUT CONTRAST  TECHNIQUE: Multidetector CT imaging of the head and cervical spine was performed following the standard protocol without intravenous contrast. Multiplanar CT image reconstructions of the cervical spine were also generated.  COMPARISON:  None.  FINDINGS: CT HEAD FINDINGS  There is a right anterior temporal epidural hematoma adjacent to displaced fractures  of the anterior portion of the middle cranial fossa. The epidural hematoma measures 1.8 cm in depth by 2.4 cm longitudinal.  There is a moderately large volume of subarachnoid blood collected in the suprasellar region, anterior interhemispheric fissure, and right sylvian fissure. There also is a small subdural hemorrhage measuring 3.5 mm at the lateral aspect of the right temporal lobe and there is blood at the undersurface of the right frontal lobe which may also be subdural. There is a small volume pneumocranium within the right lateral temporal subdural collection, probably introduced through the depressed right temporal fracture.  There is no midline shift. There may be a small amount of hemorrhagic contusion in the right temporal lobe. Brain parenchyma otherwise appears intact.  There is a depressed and overriding fracture of the right temporal bone. There also are nondepressed fractures more posteriorly and superiorly through the squamous portion of the temporal bone. There are right zygomatic arch fractures. There is a fracture of the superior right orbit with a small amount of air and blood in the upper right orbit. There is mild right proptosis. The ocular globe appears grossly intact. There is a fracture across the right orbital roof which extends medially across the midline through the planum sphenoidale. There is a stellate fracture of the floor of the right middle cranial fossa, portions of which extend medially across the midline through the clivus. There is blood within the sphenoid sinuses and ethmoid air cells. One of the fracture lines extending medially from the superior right orbit crosses the roof of the sphenoid sinuses.  CT CERVICAL SPINE FINDINGS  The vertebral column, pedicles and facet articulations are intact. There is no evidence of acute fracture. No acute soft tissue abnormalities are evident.  No significant arthritic changes are evident.  IMPRESSION: 1. Epidural hematoma in the right  anterior temporal region adjacent to displaced fractures of the anterior portion of the right middle cranial fossa. The epidural hematoma measures 1.8 cm in depth. 2. Moderately large volume subarachnoid blood in the suprasellar region, anterior interhemispheric fissure and right sylvian fissure. 3. Small subdural hemorrhage at the lateral aspect of the right temporal lobe. There also is blood at the undersurface of the right frontal lobe which is probably subdural. 4. Small volume pneumocranium within the subdural hemorrhage at the lateral right temporal lobe 5. Possible hemorrhagic contusion within the right temporal lobe 6. Complex skullbase fractures extending across the midline from the right orbital roof and from the right middle cranial fossa floor. 7. There is blood and air within the superior right orbit, with right proptosis. The ocular globe appears intact. 8. Fracture of the superior right orbit. Depressed and overriding fracture of the right temporal bone. Fractures of the squamous portion of the temporal bone as well. Fracture of the floor of the right middle cranial fossa. Fractures of the right zygomatic arch. 9. Negative for acute cervical spine fracture Critical Value/emergent results were discussed by telephone at the time of interpretation on 01/31/2015 at 4:28 am with Dr. Ross Marcus .   Electronically Signed   By: Bevelyn Buckles  Clovis Riley M.D.   On: 01/31/2015 04:48   Ct Chest W Contrast  01/31/2015   CLINICAL DATA:  Alcohol intoxication, fall, head trauma  EXAM: CT CHEST, ABDOMEN, AND PELVIS WITH CONTRAST  TECHNIQUE: Multidetector CT imaging of the chest, abdomen and pelvis was performed following the standard protocol during bolus administration of intravenous contrast.  CONTRAST:  100 mL Omnipaque 300  COMPARISON:  None.  FINDINGS: CT CHEST FINDINGS  Mediastinum/Nodes: 7 mm low-attenuation lesion right thyroid. Endotracheal tube terminates above the carina. Orogastric tube extends into the  stomach. No significant mediastinal or hilar adenopathy. No pleural or pericardial effusion. Great vessels appear to be intact.  Lungs/Pleura: No pleural effusion or pneumothorax. There is bilateral dependent atelectasis. The lungs are clear otherwise.  Musculoskeletal: Negative  CT ABDOMEN PELVIS FINDINGS  Hepatobiliary: Negative  Pancreas: Negative  Spleen: Negative  Adrenals/Urinary Tract: No significant abnormalities. Numerous small renal cysts. Bladder decompressed by Foley catheter.  Stomach/Bowel: Negative  Vascular/Lymphatic: No significant abnormalities  Reproductive: Negative  Other: Trace free fluid in the pelvis  Musculoskeletal: No acute findings  IMPRESSION: No significant abnormalities. Bilateral dependent atelectasis. Trace free fluid in the pelvis.   Electronically Signed   By: Esperanza Heir M.D.   On: 01/31/2015 07:54   Ct Cervical Spine Wo Contrast  01/31/2015   CLINICAL DATA:  Larey Seat.  Unresponsive.  EXAM: CT HEAD WITHOUT CONTRAST  CT CERVICAL SPINE WITHOUT CONTRAST  TECHNIQUE: Multidetector CT imaging of the head and cervical spine was performed following the standard protocol without intravenous contrast. Multiplanar CT image reconstructions of the cervical spine were also generated.  COMPARISON:  None.  FINDINGS: CT HEAD FINDINGS  There is a right anterior temporal epidural hematoma adjacent to displaced fractures of the anterior portion of the middle cranial fossa. The epidural hematoma measures 1.8 cm in depth by 2.4 cm longitudinal.  There is a moderately large volume of subarachnoid blood collected in the suprasellar region, anterior interhemispheric fissure, and right sylvian fissure. There also is a small subdural hemorrhage measuring 3.5 mm at the lateral aspect of the right temporal lobe and there is blood at the undersurface of the right frontal lobe which may also be subdural. There is a small volume pneumocranium within the right lateral temporal subdural collection, probably  introduced through the depressed right temporal fracture.  There is no midline shift. There may be a small amount of hemorrhagic contusion in the right temporal lobe. Brain parenchyma otherwise appears intact.  There is a depressed and overriding fracture of the right temporal bone. There also are nondepressed fractures more posteriorly and superiorly through the squamous portion of the temporal bone. There are right zygomatic arch fractures. There is a fracture of the superior right orbit with a small amount of air and blood in the upper right orbit. There is mild right proptosis. The ocular globe appears grossly intact. There is a fracture across the right orbital roof which extends medially across the midline through the planum sphenoidale. There is a stellate fracture of the floor of the right middle cranial fossa, portions of which extend medially across the midline through the clivus. There is blood within the sphenoid sinuses and ethmoid air cells. One of the fracture lines extending medially from the superior right orbit crosses the roof of the sphenoid sinuses.  CT CERVICAL SPINE FINDINGS  The vertebral column, pedicles and facet articulations are intact. There is no evidence of acute fracture. No acute soft tissue abnormalities are evident.  No significant arthritic changes are  evident.  IMPRESSION: 1. Epidural hematoma in the right anterior temporal region adjacent to displaced fractures of the anterior portion of the right middle cranial fossa. The epidural hematoma measures 1.8 cm in depth. 2. Moderately large volume subarachnoid blood in the suprasellar region, anterior interhemispheric fissure and right sylvian fissure. 3. Small subdural hemorrhage at the lateral aspect of the right temporal lobe. There also is blood at the undersurface of the right frontal lobe which is probably subdural. 4. Small volume pneumocranium within the subdural hemorrhage at the lateral right temporal lobe 5. Possible  hemorrhagic contusion within the right temporal lobe 6. Complex skullbase fractures extending across the midline from the right orbital roof and from the right middle cranial fossa floor. 7. There is blood and air within the superior right orbit, with right proptosis. The ocular globe appears intact. 8. Fracture of the superior right orbit. Depressed and overriding fracture of the right temporal bone. Fractures of the squamous portion of the temporal bone as well. Fracture of the floor of the right middle cranial fossa. Fractures of the right zygomatic arch. 9. Negative for acute cervical spine fracture Critical Value/emergent results were discussed by telephone at the time of interpretation on 01/31/2015 at 4:28 am with Dr. Ross Marcus .   Electronically Signed   By: Ellery Plunk M.D.   On: 01/31/2015 04:48   Ct Abdomen Pelvis W Contrast  01/31/2015   CLINICAL DATA:  Alcohol intoxication, fall, head trauma  EXAM: CT CHEST, ABDOMEN, AND PELVIS WITH CONTRAST  TECHNIQUE: Multidetector CT imaging of the chest, abdomen and pelvis was performed following the standard protocol during bolus administration of intravenous contrast.  CONTRAST:  100 mL Omnipaque 300  COMPARISON:  None.  FINDINGS: CT CHEST FINDINGS  Mediastinum/Nodes: 7 mm low-attenuation lesion right thyroid. Endotracheal tube terminates above the carina. Orogastric tube extends into the stomach. No significant mediastinal or hilar adenopathy. No pleural or pericardial effusion. Great vessels appear to be intact.  Lungs/Pleura: No pleural effusion or pneumothorax. There is bilateral dependent atelectasis. The lungs are clear otherwise.  Musculoskeletal: Negative  CT ABDOMEN PELVIS FINDINGS  Hepatobiliary: Negative  Pancreas: Negative  Spleen: Negative  Adrenals/Urinary Tract: No significant abnormalities. Numerous small renal cysts. Bladder decompressed by Foley catheter.  Stomach/Bowel: Negative  Vascular/Lymphatic: No significant abnormalities   Reproductive: Negative  Other: Trace free fluid in the pelvis  Musculoskeletal: No acute findings  IMPRESSION: No significant abnormalities. Bilateral dependent atelectasis. Trace free fluid in the pelvis.   Electronically Signed   By: Esperanza Heir M.D.   On: 01/31/2015 07:54   Dg Chest Port 1 View  01/31/2015   CLINICAL DATA:  Larey Seat  EXAM: PORTABLE CHEST - 1 VIEW  COMPARISON:  06/07/2009  FINDINGS: The endotracheal tube is in the origin of the right mainstem bronchus. Nasogastric tube extends into the stomach. The lungs are grossly clear. No large effusions. No pneumothorax.  IMPRESSION: Right mainstem intubation. Recommend withdrawal of the endotracheal tube 2-3 cm. These results were called by telephone at the time of interpretation on 01/31/2015 at 3:37 am to Dr. Ross Marcus , who verbally acknowledged these results.   Electronically Signed   By: Ellery Plunk M.D.   On: 01/31/2015 03:38   Ct Maxillofacial Wo Cm  01/31/2015   CLINICAL DATA:  Unresponsive, on ventilator, numerous skull fractures seen on head CT  EXAM: CT MAXILLOFACIAL WITHOUT CONTRAST  TECHNIQUE: Multidetector CT imaging of the maxillofacial structures was performed. Multiplanar CT image reconstructions were also generated. A small  metallic BB was placed on the right temple in order to reliably differentiate right from left.  COMPARISON:  Head CT performed today  FINDINGS: As seen on head CT, there is acute intracranial hemorrhage with anterior temporal lobe epidural hematoma and subarachnoid hemorrhage in the suprasellar region, anterior interhemispheric fissure, and sylvian fissure on the right. Small lateral right temporal subdural hematoma again identified. Small focus of pneumocranium within the lateral right temporal subdural hematoma again identified.  There is a depressed fracture of the right temporal bone, depressed by about 4 mm. This is seen directly posterior to the right orbit. More posteriorly and superiorly there are  nondisplaced squamous temporal bone fractures. There is a minimally displaced fracture of the right zygoma, depressed by about 1 mm. It is fractured in its central portion as well as at its posterior aspect.  There is a fracture of the roof of the orbit on the right. This extends medially to the midline across the planum sphenoidale. It involves the roof of the sphenoid sinus. There is also fracture of the posterior medial wall of the right orbit extending into posterior ethmoid air cell. There is hemorrhage and air with in the right orbit with right proptosis. The ocular globe appears intact.  There is a fracture, nondisplaced, involving the floor of the right middle cranial fossa, a portion of which extends medially through the clivus. This is seen less conspicuously than its appearance on the head CT performed earlier today. There is hemorrhage within the sphenoid sinuses. There is hemorrhage within the right maxillary sinus. There is also hemorrhage within the ethmoid air cells bilaterally.  There is a mildly displaced left nasal bone fracture. The patient is intubated and there is an orogastric tube present.  IMPRESSION: Multiple complex facial bone and skullbase fractures. Hemorrhage and air in the right orbit causing proptosis. Fractures involving ethmoid and sphenoid sinuses. Intracranial hemorrhage as Dr. Clovis Riley discussed with Dr. Wilkie Aye earlier.   Electronically Signed   By: Esperanza Heir M.D.   On: 01/31/2015 07:40    Assessment/Plan: Stable from a neurosurgical perspective, repeat CT scan stable as well. We'll need to continue on ventilator for support. We'll repeat CT brain without in a.m.   Hewitt Shorts, MD 01/31/2015, 10:25 AM

## 2015-01-31 NOTE — Consult Note (Signed)
Reason for Consult:  Head injury Referring Physician:  Dr. Harvel Quale  Laurie Booker is an 49 y.o. female.  HPI: Patient brought to the [facility] hospital emergency room with altered mental status. Dr. Dina Rich says that the EMS staff reported the patient fell when getting out of a cab and struck her head. Patient arrived with altered mental status, uncooperative with care and exam. Patient intubated in the ER, and subsequently sent for CT of head and cervical spine. CT of the head reveals right temporal skull fracture, mildly depressed, as well as right zygoma fracture and basilar skull fracture. CT also reveals a small right temporal fossa epidural hematoma, moderate traumatic subarachnoid hemorrhage extending to the right sylvian fissure, basal cisterns, interhemispheric fissure, and right hemispheric sulci, and a small amount of pneumocephalus. Patient also found to be intoxicated with a blood alcohol level of 301. Patient intubated, nonconversant, unable to provide any history.  Past Medical History: No past medical history on file.  Past Surgical History: No past surgical history on file.  Family History: No family history on file.  Social History:  has no tobacco, alcohol, and drug history on file.  Allergies: Allergies not on file  Medications: Patient unable to provide any information regarding medications prior to hospitalization, but has been given etomidate and muscle relaxant for intubation, Versed for sedation, and is going to be started on a propofol drip for sedation.  ROS:  Unobtainable due to the nature the patient's condition  Physical Examination: Patient intubated via ETT, supported by mechanical ventilator. In cervical collar. Blood pressure 125/88, pulse 69, temperature 95.1 F (35.1 C), temperature source Rectal, resp. rate 17, SpO2 100 %. External examination: Mild ecchymosis of right upper eyelid, blood in the left external auditory canal, no right  hemotympanum.  Neurological Examination: Mental Status Examination:  Not opening eyes to voice or pain. Cranial Nerve Examination:  Pupils 2.5 mm bilaterally, round, reactive to light. Motor Examination:  Moving all 4 extremities purposefully. Sensory Examination:  Senses simulation with each extremity. Reflex Examination:   Symmetrical in upper and lower extremities. Gait and Stance Examination:  Not tested due to the nature the patient's condition.   Results for orders placed or performed during the hospital encounter of 01/31/15 (from the past 48 hour(s))  CDS serology     Status: None   Collection Time: 01/31/15  3:24 AM  Result Value Ref Range   CDS serology specimen CDSCMT   Comprehensive metabolic panel     Status: Abnormal   Collection Time: 01/31/15  3:24 AM  Result Value Ref Range   Sodium 133 (L) 135 - 145 mmol/L   Potassium 2.8 (L) 3.5 - 5.1 mmol/L   Chloride 100 (L) 101 - 111 mmol/L   CO2 21 (L) 22 - 32 mmol/L   Glucose, Bld 163 (H) 65 - 99 mg/dL   BUN 9 6 - 20 mg/dL   Creatinine, Ser 0.71 0.44 - 1.00 mg/dL   Calcium 8.7 (L) 8.9 - 10.3 mg/dL   Total Protein 6.9 6.5 - 8.1 g/dL   Albumin 4.1 3.5 - 5.0 g/dL   AST 23 15 - 41 U/L   ALT 16 14 - 54 U/L   Alkaline Phosphatase 63 38 - 126 U/L   Total Bilirubin 0.5 0.3 - 1.2 mg/dL   GFR calc non Af Amer >60 >60 mL/min   GFR calc Af Amer >60 >60 mL/min    Comment: (NOTE) The eGFR has been calculated using the CKD EPI  equation. This calculation has not been validated in all clinical situations. eGFR's persistently <60 mL/min signify possible Chronic Kidney Disease.    Anion gap 12 5 - 15  CBC     Status: Abnormal   Collection Time: 01/31/15  3:24 AM  Result Value Ref Range   WBC 10.2 4.0 - 10.5 K/uL   RBC 3.66 (L) 3.87 - 5.11 MIL/uL   Hemoglobin 10.7 (L) 12.0 - 15.0 g/dL   HCT 32.7 (L) 36.0 - 46.0 %   MCV 89.3 78.0 - 100.0 fL   MCH 29.2 26.0 - 34.0 pg   MCHC 32.7 30.0 - 36.0 g/dL   RDW 13.2 11.5 - 15.5 %    Platelets 246 150 - 400 K/uL  Protime-INR     Status: None   Collection Time: 01/31/15  3:24 AM  Result Value Ref Range   Prothrombin Time 13.9 11.6 - 15.2 seconds   INR 1.05 0.00 - 1.49  Sample to Blood Bank     Status: None   Collection Time: 01/31/15  3:28 AM  Result Value Ref Range   Blood Bank Specimen SAMPLE AVAILABLE FOR TESTING    Sample Expiration 02/01/2015   Ethanol     Status: Abnormal   Collection Time: 01/31/15  3:30 AM  Result Value Ref Range   Alcohol, Ethyl (B) 301 (HH) <5 mg/dL    Comment:        LOWEST DETECTABLE LIMIT FOR SERUM ALCOHOL IS 5 mg/dL FOR MEDICAL PURPOSES ONLY REPEATED TO VERIFY CRITICAL RESULT CALLED TO, READ BACK BY AND VERIFIED WITH: HINSON D,RN 01/31/15 0415 WAYK   I-Stat arterial blood gas, ED     Status: Abnormal   Collection Time: 01/31/15  3:55 AM  Result Value Ref Range   pH, Arterial 7.450 7.350 - 7.450   pCO2 arterial 31.5 (L) 35.0 - 45.0 mmHg   pO2, Arterial 400.0 (H) 80.0 - 100.0 mmHg   Bicarbonate 22.3 20.0 - 24.0 mEq/L   TCO2 23 0 - 100 mmol/L   O2 Saturation 100.0 %   Acid-base deficit 2.0 0.0 - 2.0 mmol/L   Patient temperature 95.1 F    Collection site RADIAL, ALLEN'S TEST ACCEPTABLE    Drawn by Operator    Sample type ARTERIAL     Ct Head Wo Contrast  01/31/2015   CLINICAL DATA:  [place].  Unresponsive.  EXAM: CT HEAD WITHOUT CONTRAST  CT CERVICAL SPINE WITHOUT CONTRAST  TECHNIQUE: Multidetector CT imaging of the head and cervical spine was performed following the standard protocol without intravenous contrast. Multiplanar CT image reconstructions of the cervical spine were also generated.  COMPARISON:  None.  FINDINGS: CT HEAD FINDINGS  There is a right anterior temporal epidural hematoma adjacent to displaced fractures of the anterior portion of the middle cranial fossa. The epidural hematoma measures 1.8 cm in depth by 2.4 cm longitudinal.  There is a moderately large volume of subarachnoid blood collected in the suprasellar  region, anterior interhemispheric fissure, and right sylvian fissure. There also is a small subdural hemorrhage measuring 3.5 mm at the lateral aspect of the right temporal lobe and there is blood at the undersurface of the right frontal lobe which may also be subdural. There is a small volume pneumocranium within the right lateral temporal subdural collection, probably introduced through the depressed right temporal fracture.  There is no midline shift. There may be a small amount of hemorrhagic contusion in the right temporal lobe. Brain parenchyma otherwise appears intact.  There is a  depressed and overriding fracture of the right temporal bone. There also are nondepressed fractures more posteriorly and superiorly through the squamous portion of the temporal bone. There are right zygomatic arch fractures. There is a fracture of the superior right orbit with a small amount of air and blood in the upper right orbit. There is mild right proptosis. The ocular globe appears grossly intact. There is a fracture across the right orbital roof which extends medially across the midline through the planum sphenoidale. There is a stellate fracture of the floor of the right middle cranial fossa, portions of which extend medially across the midline through the clivus. There is blood within the sphenoid sinuses and ethmoid air cells. One of the fracture lines extending medially from the superior right orbit crosses the roof of the sphenoid sinuses.  CT CERVICAL SPINE FINDINGS  The vertebral column, pedicles and facet articulations are intact. There is no evidence of acute fracture. No acute soft tissue abnormalities are evident.  No significant arthritic changes are evident.  IMPRESSION: 1. Epidural hematoma in the right anterior temporal region adjacent to displaced fractures of the anterior portion of the right middle cranial fossa. The epidural hematoma measures 1.8 cm in depth. 2. Moderately large volume subarachnoid blood  in the suprasellar region, anterior interhemispheric fissure and right sylvian fissure. 3. Small subdural hemorrhage at the lateral aspect of the right temporal lobe. There also is blood at the undersurface of the right frontal lobe which is probably subdural. 4. Small volume pneumocranium within the subdural hemorrhage at the lateral right temporal lobe 5. Possible hemorrhagic contusion within the right temporal lobe 6. Complex skullbase fractures extending across the midline from the right orbital roof and from the right middle cranial fossa floor. 7. There is blood and air within the superior right orbit, with right proptosis. The ocular globe appears intact. 8. Fracture of the superior right orbit. Depressed and overriding fracture of the right temporal bone. Fractures of the squamous portion of the temporal bone as well. Fracture of the floor of the right middle cranial fossa. Fractures of the right zygomatic arch. 9. Negative for acute cervical spine fracture Critical Value/emergent results were discussed by telephone at the time of interpretation on 01/31/2015 at 4:28 am with Dr. Thayer Jew .   Electronically Signed   By: Andreas Newport M.D.   On: 01/31/2015 04:48   Ct Cervical Spine Wo Contrast  01/31/2015   CLINICAL DATA:  [place].  Unresponsive.  EXAM: CT HEAD WITHOUT CONTRAST  CT CERVICAL SPINE WITHOUT CONTRAST  TECHNIQUE: Multidetector CT imaging of the head and cervical spine was performed following the standard protocol without intravenous contrast. Multiplanar CT image reconstructions of the cervical spine were also generated.  COMPARISON:  None.  FINDINGS: CT HEAD FINDINGS  There is a right anterior temporal epidural hematoma adjacent to displaced fractures of the anterior portion of the middle cranial fossa. The epidural hematoma measures 1.8 cm in depth by 2.4 cm longitudinal.  There is a moderately large volume of subarachnoid blood collected in the suprasellar region, anterior  interhemispheric fissure, and right sylvian fissure. There also is a small subdural hemorrhage measuring 3.5 mm at the lateral aspect of the right temporal lobe and there is blood at the undersurface of the right frontal lobe which may also be subdural. There is a small volume pneumocranium within the right lateral temporal subdural collection, probably introduced through the depressed right temporal fracture.  There is no midline shift. There may be a small  amount of hemorrhagic contusion in the right temporal lobe. Brain parenchyma otherwise appears intact.  There is a depressed and overriding fracture of the right temporal bone. There also are nondepressed fractures more posteriorly and superiorly through the squamous portion of the temporal bone. There are right zygomatic arch fractures. There is a fracture of the superior right orbit with a small amount of air and blood in the upper right orbit. There is mild right proptosis. The ocular globe appears grossly intact. There is a fracture across the right orbital roof which extends medially across the midline through the planum sphenoidale. There is a stellate fracture of the floor of the right middle cranial fossa, portions of which extend medially across the midline through the clivus. There is blood within the sphenoid sinuses and ethmoid air cells. One of the fracture lines extending medially from the superior right orbit crosses the roof of the sphenoid sinuses.  CT CERVICAL SPINE FINDINGS  The vertebral column, pedicles and facet articulations are intact. There is no evidence of acute fracture. No acute soft tissue abnormalities are evident.  No significant arthritic changes are evident.  IMPRESSION: 1. Epidural hematoma in the right anterior temporal region adjacent to displaced fractures of the anterior portion of the right middle cranial fossa. The epidural hematoma measures 1.8 cm in depth. 2. Moderately large volume subarachnoid blood in the suprasellar  region, anterior interhemispheric fissure and right sylvian fissure. 3. Small subdural hemorrhage at the lateral aspect of the right temporal lobe. There also is blood at the undersurface of the right frontal lobe which is probably subdural. 4. Small volume pneumocranium within the subdural hemorrhage at the lateral right temporal lobe 5. Possible hemorrhagic contusion within the right temporal lobe 6. Complex skullbase fractures extending across the midline from the right orbital roof and from the right middle cranial fossa floor. 7. There is blood and air within the superior right orbit, with right proptosis. The ocular globe appears intact. 8. Fracture of the superior right orbit. Depressed and overriding fracture of the right temporal bone. Fractures of the squamous portion of the temporal bone as well. Fracture of the floor of the right middle cranial fossa. Fractures of the right zygomatic arch. 9. Negative for acute cervical spine fracture Critical Value/emergent results were discussed by telephone at the time of interpretation on 01/31/2015 at 4:28 am with Dr. Thayer Jew .   Electronically Signed   By: Andreas Newport M.D.   On: 01/31/2015 04:48   Dg Chest Port 1 View  01/31/2015   CLINICAL DATA:  [place]  EXAM: PORTABLE CHEST - 1 VIEW  COMPARISON:  06/07/2009  FINDINGS: The endotracheal tube is in the origin of the right mainstem bronchus. Nasogastric tube extends into the stomach. The lungs are grossly clear. No large effusions. No pneumothorax.  IMPRESSION: Right mainstem intubation. Recommend withdrawal of the endotracheal tube 2-3 cm. These results were called by telephone at the time of interpretation on 01/31/2015 at 3:37 am to Dr. Thayer Jew , who verbally acknowledged these results.   Electronically Signed   By: Andreas Newport M.D.   On: 01/31/2015 03:38     Assessment/Plan: Patient with significant head injury, with traumatic brain injury, skull fracture, small epidural hematoma with  a Glasgow Coma Scale of 7/15 (purposeful, not opening eyes, no speech), who is also intoxicated with a blood alcohol level of 301. Patient to be admitted to the trauma surgical service, and will require close observation in the ICU. Repeat CT of the  head without contrast will need to be done about 6 hours. For now I feel that craniotomy can be deferred, but if CT shows increased size and epidural, or patient has any interval neurologic decline, she may require craniotomy. On the other hand she may continue to improve neurologically, as her intoxication clears. No friends or family present to provide information.  Hosie Spangle, MD 01/31/2015, 5:10 AM
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
## 2015-01-31 NOTE — ED Notes (Signed)
Dr. Donell Beers repaged by Dois Davenport from answering service @ [phone].

## 2015-01-31 NOTE — Plan of Care (Signed)
Problem: Phase I Progression Outcomes Goal: Adequate ventilation/oxygenation Outcome: Progressing Pt. Mechanically ventilated at this time. SPO2 100% on FIO2 of 40%
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
## 2015-01-31 NOTE — ED Notes (Signed)
Per [facility], pt arrived at a [facility] strangers, intoxicated and fell.  She was unable to give any info to EMS or residents.  Pt did not respond to pain (IV) but is "physicially uncooperative to EMS/Staff. Pt was NSR in field w/ stable vitals, she is incontinent of urine and stool.

## 2015-01-31 NOTE — ED Notes (Signed)
Patient Belongings: 1 NCDL, 1 Visa Card, 1 $10 Bill, 1 $20 Bill, 1 Set keys, and 1 cell phone placed in security safe. 1 Pair Tan Platform shoes and black handbag remain with patient.

## 2015-02-01 ENCOUNTER — Inpatient Hospital Stay (HOSPITAL_COMMUNITY): Payer: PRIVATE HEALTH INSURANCE

## 2015-02-01 LAB — CBC
HCT: 31.8 % — ABNORMAL LOW (ref 36.0–46.0)
Hemoglobin: 10.6 g/dL — ABNORMAL LOW (ref 12.0–15.0)
MCH: 29.7 pg (ref 26.0–34.0)
MCHC: 33.3 g/dL (ref 30.0–36.0)
MCV: 89.1 fL (ref 78.0–100.0)
Platelets: 208 10*3/uL (ref 150–400)
RBC: 3.57 MIL/uL — ABNORMAL LOW (ref 3.87–5.11)
RDW: 13.6 % (ref 11.5–15.5)
WBC: 14.5 10*3/uL — ABNORMAL HIGH (ref 4.0–10.5)

## 2015-02-01 LAB — BASIC METABOLIC PANEL
Anion gap: 9 (ref 5–15)
BUN: 6 mg/dL (ref 6–20)
CO2: 23 mmol/L (ref 22–32)
Calcium: 8.7 mg/dL — ABNORMAL LOW (ref 8.9–10.3)
Chloride: 106 mmol/L (ref 101–111)
Creatinine, Ser: 0.74 mg/dL (ref 0.44–1.00)
GFR calc Af Amer: >60 mL/min (ref >60)
GFR calc non Af Amer: >60 mL/min (ref >60)
Glucose, Bld: 166 mg/dL — ABNORMAL HIGH (ref 65–99)
Potassium: 4.2 mmol/L (ref 3.5–5.1)
Sodium: 138 mmol/L (ref 135–145)

## 2015-02-01 LAB — TRIGLYCERIDES: Triglycerides: 71 mg/dL (ref <150)

## 2015-02-01 MED ORDER — PNEUMOCOCCAL VAC POLYVALENT 25 MCG/0.5ML IJ INJ
0.5000 mL | INJECTION | INTRAMUSCULAR | Status: AC
  Administered 2015-02-02: 0.5 mL via INTRAMUSCULAR
  Filled 2015-02-01: qty 0.5

## 2015-02-01 MED ORDER — THIAMINE HCL 100 MG/ML IJ SOLN
100.0000 mg | Freq: Every day | INTRAMUSCULAR | Status: DC
  Filled 2015-02-01 (×2): qty 1

## 2015-02-01 MED ORDER — VITAMIN B-1 100 MG PO TABS
100.0000 mg | ORAL_TABLET | Freq: Every day | ORAL | Status: DC
  Administered 2015-02-01 – 2015-02-02 (×2): 100 mg
  Filled 2015-02-01 (×2): qty 1

## 2015-02-01 MED ORDER — LORAZEPAM 2 MG/ML IJ SOLN: 0.0000 mg | Freq: Four times a day (QID) | INTRAMUSCULAR | Status: DC

## 2015-02-01 MED ORDER — LORAZEPAM 2 MG/ML IJ SOLN: 1.0000 mg | Freq: Four times a day (QID) | INTRAMUSCULAR | Status: AC | PRN

## 2015-02-01 MED ORDER — LORAZEPAM 2 MG/ML IJ SOLN: 0.0000 mg | Freq: Two times a day (BID) | INTRAMUSCULAR | Status: DC

## 2015-02-01 MED ORDER — FOLIC ACID 1 MG PO TABS
1.0000 mg | ORAL_TABLET | Freq: Every day | ORAL | Status: DC
  Administered 2015-02-01 – 2015-02-02 (×2): 1 mg
  Filled 2015-02-01 (×2): qty 1

## 2015-02-01 MED ORDER — OXYCODONE HCL 5 MG PO TABS
5.0000 mg | ORAL_TABLET | ORAL | Status: DC | PRN
  Administered 2015-02-01 (×2): 5 mg via ORAL
  Administered 2015-02-02 (×3): 10 mg via ORAL
  Administered 2015-02-02: 5 mg via ORAL
  Administered 2015-02-03 – 2015-02-05 (×8): 10 mg via ORAL
  Filled 2015-02-01 (×3): qty 2
  Filled 2015-02-01: qty 1
  Filled 2015-02-01 (×5): qty 2
  Filled 2015-02-01: qty 1
  Filled 2015-02-01 (×2): qty 2
  Filled 2015-02-01: qty 1
  Filled 2015-02-01: qty 2

## 2015-02-01 MED ORDER — ADULT MULTIVITAMIN W/MINERALS CH
1.0000 | ORAL_TABLET | Freq: Every day | ORAL | Status: DC
  Administered 2015-02-01 – 2015-02-04 (×4): 1
  Filled 2015-02-01 (×4): qty 1

## 2015-02-01 MED ORDER — LORAZEPAM 1 MG PO TABS: 1.0000 mg | ORAL_TABLET | Freq: Four times a day (QID) | ORAL | Status: AC | PRN

## 2015-02-01 NOTE — Progress Notes (Signed)
Patient ID: Laurie Booker, female   DOB: 06-06-1966, 49 y.o.   MRN: [id number] Alert and has tolerated extubation. CT C spine neg. No posterior midline tenderness. No pain on AROM. D/C collar. Violeta Gelinas, MD, MPH, FACS Trauma: [phone] General Surgery: [phone]

## 2015-02-01 NOTE — Progress Notes (Signed)
RT note- ETT withdrawn 1cm per Dr. Janee Morn.

## 2015-02-01 NOTE — Progress Notes (Signed)
Subjective: Patient continues on ventilator via ETT. Sedated with propofol drip, but requiring frequent when necessary morphine and Versed. CT of brain without contrast this morning shows stability of right temporal fossa epidural hematoma, some coalescence of small right subfrontal traumatic intracerebral hematoma, some clearing of traumatic subarachnoid hemorrhage, mild asymmetry of frontal horns, with right small than left consistent with some mild right hemispheric posttraumatic edema, but no significant midline shift.  Objective: Vital signs in last 24 hours: Filed Vitals:   02/01/15 0430 02/01/15 0500 02/01/15 0600 02/01/15 0700  BP: 119/70 123/59 116/67 102/61  Pulse: 79 86 90 72  Temp:      TempSrc:      Resp: 27 17 16 16   Height:      Weight:  55.9 kg (123 lb 3.8 oz)    SpO2: 100% 100% 100% 100%    Intake/Output from previous day: 08/07 0701 - 08/08 0700 In: 2014.1 [I.V.:2014.1] Out: 2155 [Urine:2155] Intake/Output this shift:    Physical Exam:  Moving all 4 extremities purposefully, but not following commands. No attempt at speech. Closing I strongly during examination, but pupils 3 mm bilaterally, round, reactive to light.  CBC  Recent Labs  01/31/15 0324 02/01/15 0522  WBC 10.2 14.5*  HGB 10.7* 10.6*  HCT 32.7* 31.8*  PLT 246 208   BMET  Recent Labs  01/31/15 0324 02/01/15 0522  NA 133* 138  K 2.8* 4.2  CL 100* 106  CO2 21* 23  GLUCOSE 163* 166*  BUN 9 6  CREATININE 0.71 0.74  CALCIUM 8.7* 8.7*   ABG    Component Value Date/Time   PHART 7.450 01/31/2015 0355   PCO2ART 31.5* 01/31/2015 0355   PO2ART 400.0* 01/31/2015 0355   HCO3 22.3 01/31/2015 0355   TCO2 23 01/31/2015 0355   ACIDBASEDEF 2.0 01/31/2015 0355   O2SAT 100.0 01/31/2015 0355    Studies/Results: Ct Head Wo Contrast  02/01/2015   CLINICAL DATA:  Follow-up. 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  EXAM: CT HEAD WITHOUT CONTRAST  TECHNIQUE: Contiguous axial  images were obtained from the base of the skull through the vertex without intravenous contrast.  COMPARISON:  CT head January 31, 2015  FINDINGS: 2 cm RIGHT middle cranial fossa lentiform dense probable epidural hematoma is unchanged. 2 mm RIGHT frontotemporal acute subdural hematoma with small amount of extra-axial pneumocephalus. 11 mm RIGHT inferior frontal lobe hemorrhagic contusion with surrounding low-density vasogenic edema. Moderate amount of subarachnoid blood on the RIGHT. Mild RIGHT frontotemporal sulcal effacement without midline shift.  No hydrocephalus. Punctate focus of redistributed blood products dependent within the occipital horn. Basal cisterns are patent.  Mild RIGHT proptosis. RIGHT skullbase fractures again noted, including RIGHT zygomatic arch, RIGHT squamosal temporal bone extending to the greater wing of the sphenoid, and planum sphenoidale. RIGHT temporal bone fragment is depressed 4 mm, with over riding bony fragments. Moderate RIGHT frontotemporal scalp hematoma insinuating into the temporalis muscle.  IMPRESSION: Similar 2 cm RIGHT middle cranial fossa epidural hematoma with 2 mm RIGHT frontotemporal subdural hematoma and small amount of extra-axial pneumocephalus. Slightly decreased moderate subarachnoid hemorrhage.  Stable RIGHT inferior frontal lobe hemorrhagic contusion.  Minimal intraventricular blood products without hydrocephalus.  Acute RIGHT skullbase fractures, including depressed RIGHT temporal bone fracture.   Electronically Signed   By: Awilda Metro M.D.   On: 02/01/2015 05:08   Ct Head Without Contrast  01/31/2015   CLINICAL DATA:  Fall. Follow-up traumatic epidural hematoma and subarachnoid hemorrhage.  EXAM: CT HEAD WITHOUT  CONTRAST  TECHNIQUE: Contiguous axial images were obtained from the base of the skull through the vertex without intravenous contrast.  COMPARISON:  CT 01/31/2015.  FINDINGS: Acute epidural hematoma anteriorly in the right middle cranial fossa  has not significantly changed, measuring 2.0 x 2.3 cm on image 10. Diffuse subarachnoid hemorrhage within the basilar cisterns, right sylvian fissure and interhemispheric fissure is unchanged. There is probably a small amount of hemorrhagic contusion inferiorly in the right frontal lobe, measuring 12 mm on image 14. Minimal right to left midline shift of 3 mm is stable. There is no evidence of hydrocephalus or infarct.  Comminuted fracture of the squamous portion of the right temporal bone demonstrates up to 4 mm of depression, unchanged. There is adjacent pneumocephalus. Fractures extend into the roof of the right orbit and planum sphenoidale. There are additional nondisplaced fractures involving the lateral wall of the right orbit, the right zygomatic arch and both nasal bones. The orbital apex is intact. The fracture may extend across the hard palate, although is nondisplaced. The pterygoid plates are intact.  The mastoid air cells and middle ears are clear. There is no displacement of the carotid canals. There is fluid throughout the paranasal sinuses.  IMPRESSION: 1. Stable epidural hematoma anteriorly in the right middle cranial fossa. Stable subarachnoid hemorrhage and right frontal lobe hemorrhagic contusion. 2. No evidence of hydrocephalus or infarct. 3. Stable skullbase, facial and right temporal fractures.   Electronically Signed   By: Carey Bullocks M.D.   On: 01/31/2015 10:11   Ct Head Wo Contrast  01/31/2015   CLINICAL DATA:  Larey Seat.  Unresponsive.  EXAM: CT HEAD WITHOUT CONTRAST  CT CERVICAL SPINE WITHOUT CONTRAST  TECHNIQUE: Multidetector CT imaging of the head and cervical spine was performed following the standard protocol without intravenous contrast. Multiplanar CT image reconstructions of the cervical spine were also generated.  COMPARISON:  None.  FINDINGS: CT HEAD FINDINGS  There is a right anterior temporal epidural hematoma adjacent to displaced fractures of the anterior portion of the  middle cranial fossa. The epidural hematoma measures 1.8 cm in depth by 2.4 cm longitudinal.  There is a moderately large volume of subarachnoid blood collected in the suprasellar region, anterior interhemispheric fissure, and right sylvian fissure. There also is a small subdural hemorrhage measuring 3.5 mm at the lateral aspect of the right temporal lobe and there is blood at the undersurface of the right frontal lobe which may also be subdural. There is a small volume pneumocranium within the right lateral temporal subdural collection, probably introduced through the depressed right temporal fracture.  There is no midline shift. There may be a small amount of hemorrhagic contusion in the right temporal lobe. Brain parenchyma otherwise appears intact.  There is a depressed and overriding fracture of the right temporal bone. There also are nondepressed fractures more posteriorly and superiorly through the squamous portion of the temporal bone. There are right zygomatic arch fractures. There is a fracture of the superior right orbit with a small amount of air and blood in the upper right orbit. There is mild right proptosis. The ocular globe appears grossly intact. There is a fracture across the right orbital roof which extends medially across the midline through the planum sphenoidale. There is a stellate fracture of the floor of the right middle cranial fossa, portions of which extend medially across the midline through the clivus. There is blood within the sphenoid sinuses and ethmoid air cells. One of the fracture lines extending medially from  the superior right orbit crosses the roof of the sphenoid sinuses.  CT CERVICAL SPINE FINDINGS  The vertebral column, pedicles and facet articulations are intact. There is no evidence of acute fracture. No acute soft tissue abnormalities are evident.  No significant arthritic changes are evident.  IMPRESSION: 1. Epidural hematoma in the right anterior temporal region  adjacent to displaced fractures of the anterior portion of the right middle cranial fossa. The epidural hematoma measures 1.8 cm in depth. 2. Moderately large volume subarachnoid blood in the suprasellar region, anterior interhemispheric fissure and right sylvian fissure. 3. Small subdural hemorrhage at the lateral aspect of the right temporal lobe. There also is blood at the undersurface of the right frontal lobe which is probably subdural. 4. Small volume pneumocranium within the subdural hemorrhage at the lateral right temporal lobe 5. Possible hemorrhagic contusion within the right temporal lobe 6. Complex skullbase fractures extending across the midline from the right orbital roof and from the right middle cranial fossa floor. 7. There is blood and air within the superior right orbit, with right proptosis. The ocular globe appears intact. 8. Fracture of the superior right orbit. Depressed and overriding fracture of the right temporal bone. Fractures of the squamous portion of the temporal bone as well. Fracture of the floor of the right middle cranial fossa. Fractures of the right zygomatic arch. 9. Negative for acute cervical spine fracture Critical Value/emergent results were discussed by telephone at the time of interpretation on 01/31/2015 at 4:28 am with Dr. Ross Marcus .   Electronically Signed   By: Ellery Plunk M.D.   On: 01/31/2015 04:48   Ct Chest W Contrast  01/31/2015   CLINICAL DATA:  Alcohol intoxication, fall, head trauma  EXAM: CT CHEST, ABDOMEN, AND PELVIS WITH CONTRAST  TECHNIQUE: Multidetector CT imaging of the chest, abdomen and pelvis was performed following the standard protocol during bolus administration of intravenous contrast.  CONTRAST:  100 mL Omnipaque 300  COMPARISON:  None.  FINDINGS: CT CHEST FINDINGS  Mediastinum/Nodes: 7 mm low-attenuation lesion right thyroid. Endotracheal tube terminates above the carina. Orogastric tube extends into the stomach. No significant  mediastinal or hilar adenopathy. No pleural or pericardial effusion. Great vessels appear to be intact.  Lungs/Pleura: No pleural effusion or pneumothorax. There is bilateral dependent atelectasis. The lungs are clear otherwise.  Musculoskeletal: Negative  CT ABDOMEN PELVIS FINDINGS  Hepatobiliary: Negative  Pancreas: Negative  Spleen: Negative  Adrenals/Urinary Tract: No significant abnormalities. Numerous small renal cysts. Bladder decompressed by Foley catheter.  Stomach/Bowel: Negative  Vascular/Lymphatic: No significant abnormalities  Reproductive: Negative  Other: Trace free fluid in the pelvis  Musculoskeletal: No acute findings  IMPRESSION: No significant abnormalities. Bilateral dependent atelectasis. Trace free fluid in the pelvis.   Electronically Signed   By: Esperanza Heir M.D.   On: 01/31/2015 07:54   Ct Cervical Spine Wo Contrast  01/31/2015   CLINICAL DATA:  Larey Seat.  Unresponsive.  EXAM: CT HEAD WITHOUT CONTRAST  CT CERVICAL SPINE WITHOUT CONTRAST  TECHNIQUE: Multidetector CT imaging of the head and cervical spine was performed following the standard protocol without intravenous contrast. Multiplanar CT image reconstructions of the cervical spine were also generated.  COMPARISON:  None.  FINDINGS: CT HEAD FINDINGS  There is a right anterior temporal epidural hematoma adjacent to displaced fractures of the anterior portion of the middle cranial fossa. The epidural hematoma measures 1.8 cm in depth by 2.4 cm longitudinal.  There is a moderately large volume of subarachnoid blood collected in  the suprasellar region, anterior interhemispheric fissure, and right sylvian fissure. There also is a small subdural hemorrhage measuring 3.5 mm at the lateral aspect of the right temporal lobe and there is blood at the undersurface of the right frontal lobe which may also be subdural. There is a small volume pneumocranium within the right lateral temporal subdural collection, probably introduced through the  depressed right temporal fracture.  There is no midline shift. There may be a small amount of hemorrhagic contusion in the right temporal lobe. Brain parenchyma otherwise appears intact.  There is a depressed and overriding fracture of the right temporal bone. There also are nondepressed fractures more posteriorly and superiorly through the squamous portion of the temporal bone. There are right zygomatic arch fractures. There is a fracture of the superior right orbit with a small amount of air and blood in the upper right orbit. There is mild right proptosis. The ocular globe appears grossly intact. There is a fracture across the right orbital roof which extends medially across the midline through the planum sphenoidale. There is a stellate fracture of the floor of the right middle cranial fossa, portions of which extend medially across the midline through the clivus. There is blood within the sphenoid sinuses and ethmoid air cells. One of the fracture lines extending medially from the superior right orbit crosses the roof of the sphenoid sinuses.  CT CERVICAL SPINE FINDINGS  The vertebral column, pedicles and facet articulations are intact. There is no evidence of acute fracture. No acute soft tissue abnormalities are evident.  No significant arthritic changes are evident.  IMPRESSION: 1. Epidural hematoma in the right anterior temporal region adjacent to displaced fractures of the anterior portion of the right middle cranial fossa. The epidural hematoma measures 1.8 cm in depth. 2. Moderately large volume subarachnoid blood in the suprasellar region, anterior interhemispheric fissure and right sylvian fissure. 3. Small subdural hemorrhage at the lateral aspect of the right temporal lobe. There also is blood at the undersurface of the right frontal lobe which is probably subdural. 4. Small volume pneumocranium within the subdural hemorrhage at the lateral right temporal lobe 5. Possible hemorrhagic contusion within  the right temporal lobe 6. Complex skullbase fractures extending across the midline from the right orbital roof and from the right middle cranial fossa floor. 7. There is blood and air within the superior right orbit, with right proptosis. The ocular globe appears intact. 8. Fracture of the superior right orbit. Depressed and overriding fracture of the right temporal bone. Fractures of the squamous portion of the temporal bone as well. Fracture of the floor of the right middle cranial fossa. Fractures of the right zygomatic arch. 9. Negative for acute cervical spine fracture Critical Value/emergent results were discussed by telephone at the time of interpretation on 01/31/2015 at 4:28 am with Dr. Ross Marcus .   Electronically Signed   By: Ellery Plunk M.D.   On: 01/31/2015 04:48   Ct Abdomen Pelvis W Contrast  01/31/2015   CLINICAL DATA:  Alcohol intoxication, fall, head trauma  EXAM: CT CHEST, ABDOMEN, AND PELVIS WITH CONTRAST  TECHNIQUE: Multidetector CT imaging of the chest, abdomen and pelvis was performed following the standard protocol during bolus administration of intravenous contrast.  CONTRAST:  100 mL Omnipaque 300  COMPARISON:  None.  FINDINGS: CT CHEST FINDINGS  Mediastinum/Nodes: 7 mm low-attenuation lesion right thyroid. Endotracheal tube terminates above the carina. Orogastric tube extends into the stomach. No significant mediastinal or hilar adenopathy. No pleural or pericardial  effusion. Great vessels appear to be intact.  Lungs/Pleura: No pleural effusion or pneumothorax. There is bilateral dependent atelectasis. The lungs are clear otherwise.  Musculoskeletal: Negative  CT ABDOMEN PELVIS FINDINGS  Hepatobiliary: Negative  Pancreas: Negative  Spleen: Negative  Adrenals/Urinary Tract: No significant abnormalities. Numerous small renal cysts. Bladder decompressed by Foley catheter.  Stomach/Bowel: Negative  Vascular/Lymphatic: No significant abnormalities  Reproductive: Negative  Other:  Trace free fluid in the pelvis  Musculoskeletal: No acute findings  IMPRESSION: No significant abnormalities. Bilateral dependent atelectasis. Trace free fluid in the pelvis.   Electronically Signed   By: Esperanza Heir M.D.   On: 01/31/2015 07:54   Dg Chest Port 1 View  02/01/2015   CLINICAL DATA:  Respiratory failure.  EXAM: PORTABLE CHEST - 1 VIEW  COMPARISON:  CT 01/31/2015.  Chest x-ray 01/31/2015.  FINDINGS: Endotracheal tube now noted 1.3 cm above the carina. NG tube noted with tip below the hemidiaphragm. Low lung volumes with mild bibasilar subsegmental atelectasis. Pneumothorax. Heart size normal.  IMPRESSION: 1. Endotracheal tube tip now noted 1.3 cm above the carina. Slight proximal repositioning should be considered. NG tube in stable position. 2. Low lung volumes with mild bibasilar subsegmental atelectasis . These results will be called to the ordering clinician or representative by the Radiologist Assistant, and communication documented in the PACS or zVision Dashboard.   Electronically Signed   By: Maisie Fus  Register   On: 02/01/2015 07:38   Dg Chest Port 1 View  01/31/2015   CLINICAL DATA:  Larey Seat  EXAM: PORTABLE CHEST - 1 VIEW  COMPARISON:  06/07/2009  FINDINGS: The endotracheal tube is in the origin of the right mainstem bronchus. Nasogastric tube extends into the stomach. The lungs are grossly clear. No large effusions. No pneumothorax.  IMPRESSION: Right mainstem intubation. Recommend withdrawal of the endotracheal tube 2-3 cm. These results were called by telephone at the time of interpretation on 01/31/2015 at 3:37 am to Dr. Ross Marcus , who verbally acknowledged these results.   Electronically Signed   By: Ellery Plunk M.D.   On: 01/31/2015 03:38   Ct Maxillofacial Wo Cm  01/31/2015   CLINICAL DATA:  Unresponsive, on ventilator, numerous skull fractures seen on head CT  EXAM: CT MAXILLOFACIAL WITHOUT CONTRAST  TECHNIQUE: Multidetector CT imaging of the maxillofacial structures was  performed. Multiplanar CT image reconstructions were also generated. A small metallic BB was placed on the right temple in order to reliably differentiate right from left.  COMPARISON:  Head CT performed today  FINDINGS: As seen on head CT, there is acute intracranial hemorrhage with anterior temporal lobe epidural hematoma and subarachnoid hemorrhage in the suprasellar region, anterior interhemispheric fissure, and sylvian fissure on the right. Small lateral right temporal subdural hematoma again identified. Small focus of pneumocranium within the lateral right temporal subdural hematoma again identified.  There is a depressed fracture of the right temporal bone, depressed by about 4 mm. This is seen directly posterior to the right orbit. More posteriorly and superiorly there are nondisplaced squamous temporal bone fractures. There is a minimally displaced fracture of the right zygoma, depressed by about 1 mm. It is fractured in its central portion as well as at its posterior aspect.  There is a fracture of the roof of the orbit on the right. This extends medially to the midline across the planum sphenoidale. It involves the roof of the sphenoid sinus. There is also fracture of the posterior medial wall of the right orbit extending into posterior ethmoid  air cell. There is hemorrhage and air with in the right orbit with right proptosis. The ocular globe appears intact.  There is a fracture, nondisplaced, involving the floor of the right middle cranial fossa, a portion of which extends medially through the clivus. This is seen less conspicuously than its appearance on the head CT performed earlier today. There is hemorrhage within the sphenoid sinuses. There is hemorrhage within the right maxillary sinus. There is also hemorrhage within the ethmoid air cells bilaterally.  There is a mildly displaced left nasal bone fracture. The patient is intubated and there is an orogastric tube present.  IMPRESSION: Multiple  complex facial bone and skullbase fractures. Hemorrhage and air in the right orbit causing proptosis. Fractures involving ethmoid and sphenoid sinuses. Intracranial hemorrhage as Dr. Clovis Riley discussed with Dr. Wilkie Aye earlier.   Electronically Signed   By: Esperanza Heir M.D.   On: 01/31/2015 07:40    Assessment/Plan: Neurologically stable, expected evolution of post traumatic brain injury findings, stability of small right temporal fossa epidural hematoma. Anticipate that acute effects of traumatic brain injury will take a week to week and a half to clear, until she stabilizes enough to begin to consider rehabilitation.   Hewitt Shorts, MD 02/01/2015, 7:42 AM
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
## 2015-02-01 NOTE — Progress Notes (Signed)
RT note- Attempted to wean, tolerated fairly well, diprovan stopped completely for patient effort. No effort now, placed back to full support until awake.

## 2015-02-01 NOTE — Plan of Care (Signed)
Problem: Phase I Progression Outcomes Goal: Adequate ventilation/oxygenation Outcome: Progressing Pt. Remains intubated and mechanically ventilated d/t mental status. FIO2 at 30% and SPO2 is 100%. Will continue to monitor and update healthcare team as necessary.

## 2015-02-01 NOTE — Progress Notes (Signed)
PT Cancellation Note  Patient Details Name: Laurie Booker MRN: [id number] DOB: 03/17/1966   Cancelled Treatment:    Reason Eval/Treat Not Completed: Patient not medically ready (active bedrest will follow next day)   Fabio Asa 02/01/2015, 4:53 PM Charlotte Crumb, PT DPT  [phone]

## 2015-02-01 NOTE — Progress Notes (Signed)
RT note-Patient is weaning well on cpap/ps 5/5, but is still too sleepy, not following commands at this time.

## 2015-02-01 NOTE — Procedures (Signed)
Extubation Procedure Note  Patient Details:   Name: Laurie Booker DOB: 12-Oct-1965 MRN: [id number]   Airway Documentation:     Evaluation  O2 sats: stable throughout Complications: No apparent complications Patient did tolerate procedure well. Bilateral Breath Sounds: Clear, Diminished Suctioning: Airway, Oral Yes  Patient was able to perform appropriate maneuvers Placed to 3l/min Laurie Booker   Laurie Booker 02/01/2015, 12:41 PM

## 2015-02-02 LAB — CBC
HCT: 31.4 % — ABNORMAL LOW (ref 36.0–46.0)
Hemoglobin: 10.1 g/dL — ABNORMAL LOW (ref 12.0–15.0)
MCH: 29.8 pg (ref 26.0–34.0)
MCHC: 32.2 g/dL (ref 30.0–36.0)
MCV: 92.6 fL (ref 78.0–100.0)
Platelets: 203 10*3/uL (ref 150–400)
RBC: 3.39 MIL/uL — ABNORMAL LOW (ref 3.87–5.11)
RDW: 14.1 % (ref 11.5–15.5)
WBC: 13.1 10*3/uL — ABNORMAL HIGH (ref 4.0–10.5)

## 2015-02-02 LAB — BASIC METABOLIC PANEL
Anion gap: 6 (ref 5–15)
BUN: <5 mg/dL — ABNORMAL LOW (ref 6–20)
CO2: 23 mmol/L (ref 22–32)
Calcium: 8.8 mg/dL — ABNORMAL LOW (ref 8.9–10.3)
Chloride: 111 mmol/L (ref 101–111)
Creatinine, Ser: 0.41 mg/dL — ABNORMAL LOW (ref 0.44–1.00)
GFR calc Af Amer: >60 mL/min (ref >60)
GFR calc non Af Amer: >60 mL/min (ref >60)
Glucose, Bld: 154 mg/dL — ABNORMAL HIGH (ref 65–99)
Potassium: 4.1 mmol/L (ref 3.5–5.1)
Sodium: 140 mmol/L (ref 135–145)
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
## 2015-02-02 NOTE — Progress Notes (Signed)
   02/02/15 1525  Vitals  Temp 98.1 F (36.7 C)  Temp Source Oral  BP 125/69 mmHg  BP Location Right Arm  BP Method Automatic  Patient Position (if appropriate) Lying  Pulse Rate 66  Resp 16  Oxygen Therapy  SpO2 100 %  O2 Device Room Air  Pain Assessment  Pain Assessment 0-10  Pain Score 5  Pain Type Acute pain  Pain Location Head  Pain Descriptors / Indicators Headache  Pain Frequency Constant  Pain Onset On-going  Patients Stated Pain Goal 0  Pain Intervention(s) RN made aware  Pt arrived to 4N32 at 1527.  Pt A&O x 4, c/o 5/10 headache, site covered with CDI honeycomb dressing, no drains.   Pt V/S taken, WNL, fluids running at 75 cc/hr.  Foley removed this am, pt has since voided.  Pt without distress. Diet ordered, will monitor.
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
## 2015-02-02 NOTE — Progress Notes (Signed)
Patient ID: Laurie Booker, female   DOB: 1965-09-22, 49 y.o.   MRN: [id number]  LOS: 2 days   Subjective: Pt very awake today.  Able to describe job.  Very conversive and appropriate.    Objective: Vital signs in last 24 hours: Temp:  [98.2 F (36.8 C)-99.3 F (37.4 C)] 98.8 F (37.1 C) (08/09 0759) Pulse Rate:  [63-81] 72 (08/09 0700) Resp:  [14-22] 14 (08/09 0700) BP: (95-114)/(50-86) 112/69 mmHg (08/09 0700) SpO2:  [97 %-100 %] 98 % (08/09 0700) Last BM Date: 01/31/15  Lab Results:  CBC  Recent Labs  02/01/15 0522 02/02/15 0213  WBC 14.5* 13.1*  HGB 10.6* 10.1*  HCT 31.8* 31.4*  PLT 208 203   BMET  Recent Labs  02/01/15 0522 02/02/15 0213  NA 138 140  K 4.2 4.1  CL 106 111  CO2 23 23  GLUCOSE 166* 154*  BUN 6 <5*  CREATININE 0.74 0.41*  CALCIUM 8.7* 8.8*    Imaging: Ct Head Wo Contrast  02/01/2015   CLINICAL DATA:  Follow-up. 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  EXAM: CT HEAD WITHOUT CONTRAST  TECHNIQUE: Contiguous axial images were obtained from the base of the skull through the vertex without intravenous contrast.  COMPARISON:  CT head January 31, 2015  FINDINGS: 2 cm RIGHT middle cranial fossa lentiform dense probable epidural hematoma is unchanged. 2 mm RIGHT frontotemporal acute subdural hematoma with small amount of extra-axial pneumocephalus. 11 mm RIGHT inferior frontal lobe hemorrhagic contusion with surrounding low-density vasogenic edema. Moderate amount of subarachnoid blood on the RIGHT. Mild RIGHT frontotemporal sulcal effacement without midline shift.  No hydrocephalus. Punctate focus of redistributed blood products dependent within the occipital horn. Basal cisterns are patent.  Mild RIGHT proptosis. RIGHT skullbase fractures again noted, including RIGHT zygomatic arch, RIGHT squamosal temporal bone extending to the greater wing of the sphenoid, and planum sphenoidale. RIGHT temporal bone fragment is depressed 4 mm, with over  riding bony fragments. Moderate RIGHT frontotemporal scalp hematoma insinuating into the temporalis muscle.  IMPRESSION: Similar 2 cm RIGHT middle cranial fossa epidural hematoma with 2 mm RIGHT frontotemporal subdural hematoma and small amount of extra-axial pneumocephalus. Slightly decreased moderate subarachnoid hemorrhage.  Stable RIGHT inferior frontal lobe hemorrhagic contusion.  Minimal intraventricular blood products without hydrocephalus.  Acute RIGHT skullbase fractures, including depressed RIGHT temporal bone fracture.   Electronically Signed   By: Awilda Metro M.D.   On: 02/01/2015 05:08   Dg Chest Port 1 View  02/01/2015   ADDENDUM REPORT: 02/01/2015 07:59  ADDENDUM: In the body of the report it should read no pneumothorax.   Electronically Signed   By: Maisie Fus  Register   On: 02/01/2015 07:59   02/01/2015   CLINICAL DATA:  Respiratory failure.  EXAM: PORTABLE CHEST - 1 VIEW  COMPARISON:  CT 01/31/2015.  Chest x-ray 01/31/2015.  FINDINGS: Endotracheal tube now noted 1.3 cm above the carina. NG tube noted with tip below the hemidiaphragm. Low lung volumes with mild bibasilar subsegmental atelectasis. Pneumothorax. Heart size normal.  IMPRESSION: 1. Endotracheal tube tip now noted 1.3 cm above the carina. Slight proximal repositioning should be considered. NG tube in stable position. 2. Low lung volumes with mild bibasilar subsegmental atelectasis . These results will be called to the ordering clinician or representative by the Radiologist Assistant, and communication documented in the PACS or zVision Dashboard.  Electronically Signed: ByMaisie Fus  Register On: 02/01/2015 07:38     PE: General appearance: alert, cooperative and no distress  HEENT:  Bruising to right face.   Resp: clear to auscultation bilaterally Cardio: regular rate and rhythm GI: soft, non-tender Extremities: extremities normal, atraumatic, no cyanosis or edema Neurologic: Grossly normal    Patient Active Problem List    Diagnosis Date Noted  . SAH (subarachnoid hemorrhage) 01/31/2015    Assessment/Plan: Fall TBI/R frontal ICC/EDH/SAH/SDH - stable. HCT  improved.  Dr. Newell Coral following  HCT in AM.  TBI team.  Vent dependent resp failure - resolved R skullbase, temporal bone, orbit, zygoma, maxillary sinus and sphenoid sinus FXs - non-op per Dr. Jenne Pane ETOH abuse - CIWA d/c'd today.   FEN - fulls, advance diet as tolerated  VTE - PAS hose.   Dispo - transfer to floor

## 2015-02-02 NOTE — Progress Notes (Signed)
Called and gave report to [place], RN on 4N. Daughter called and updated on patient's location. Patient transported to 4N with NT.
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
## 2015-02-02 NOTE — Progress Notes (Signed)
Subjective: Patient resting in bed, denies headaches or other complaints. Was extubated successfully yesterday, and is tolerating a clear liquid diet well.  Objective: Vital signs in last 24 hours: Filed Vitals:   02/02/15 0500 02/02/15 0600 02/02/15 0700 02/02/15 0759  BP: 97/61 112/63 112/69   Pulse: 63 64 72   Temp:    98.8 F (37.1 C)  TempSrc:    Oral  Resp: Height:      Weight:      SpO2: 100% 99% 98%     Intake/Output from previous day: 08/08 0701 - 08/09 0700 In: 2198 [I.V.:1838; NG/GT:360] Out: 2415 [Urine:2415] Intake/Output this shift:    Physical Exam:  Awake alert, oriented to name, hospital, and 2016. Following commands briskly, speech fluent with good comprehension. Pupils 3 mm bilaterally, round, reactive to light. EOMI although right lid droops some, presumably due to local trauma. Facial movements symmetrical. Moving all 4 extremities well. No drift of upper extremities.  CBC  Recent Labs  02/01/15 0522 02/02/15 0213  WBC 14.5* 13.1*  HGB 10.6* 10.1*  HCT 31.8* 31.4*  PLT 208 203   BMET  Recent Labs  02/01/15 0522 02/02/15 0213  NA 138 140  K 4.2 4.1  CL 106 111  CO2 23 23  GLUCOSE 166* 154*  BUN 6 <5*  CREATININE 0.74 0.41*  CALCIUM 8.7* 8.8*    Studies/Results: Ct Head Wo Contrast  02/01/2015   CLINICAL DATA:  Follow-up. 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  EXAM: CT HEAD WITHOUT CONTRAST  TECHNIQUE: Contiguous axial images were obtained from the base of the skull through the vertex without intravenous contrast.  COMPARISON:  CT head January 31, 2015  FINDINGS: 2 cm RIGHT middle cranial fossa lentiform dense probable epidural hematoma is unchanged. 2 mm RIGHT frontotemporal acute subdural hematoma with small amount of extra-axial pneumocephalus. 11 mm RIGHT inferior frontal lobe hemorrhagic contusion with surrounding low-density vasogenic edema. Moderate amount of subarachnoid blood on the RIGHT. Mild RIGHT  frontotemporal sulcal effacement without midline shift.  No hydrocephalus. Punctate focus of redistributed blood products dependent within the occipital horn. Basal cisterns are patent.  Mild RIGHT proptosis. RIGHT skullbase fractures again noted, including RIGHT zygomatic arch, RIGHT squamosal temporal bone extending to the greater wing of the sphenoid, and planum sphenoidale. RIGHT temporal bone fragment is depressed 4 mm, with over riding bony fragments. Moderate RIGHT frontotemporal scalp hematoma insinuating into the temporalis muscle.  IMPRESSION: Similar 2 cm RIGHT middle cranial fossa epidural hematoma with 2 mm RIGHT frontotemporal subdural hematoma and small amount of extra-axial pneumocephalus. Slightly decreased moderate subarachnoid hemorrhage.  Stable RIGHT inferior frontal lobe hemorrhagic contusion.  Minimal intraventricular blood products without hydrocephalus.  Acute RIGHT skullbase fractures, including depressed RIGHT temporal bone fracture.   Electronically Signed   By: Awilda Metro M.D.   On: 02/01/2015 05:08   Ct Head Without Contrast  01/31/2015   CLINICAL DATA:  Fall. Follow-up traumatic epidural hematoma and subarachnoid hemorrhage.  EXAM: CT HEAD WITHOUT CONTRAST  TECHNIQUE: Contiguous axial images were obtained from the base of the skull through the vertex without intravenous contrast.  COMPARISON:  CT 01/31/2015.  FINDINGS: Acute epidural hematoma anteriorly in the right middle cranial fossa has not significantly changed, measuring 2.0 x 2.3 cm on image 10. Diffuse subarachnoid hemorrhage within the basilar cisterns, right sylvian fissure and interhemispheric fissure is unchanged. There is probably a small amount of hemorrhagic contusion inferiorly in the right frontal lobe, measuring 12  mm on image 14. Minimal right to left midline shift of 3 mm is stable. There is no evidence of hydrocephalus or infarct.  Comminuted fracture of the squamous portion of the right temporal bone  demonstrates up to 4 mm of depression, unchanged. There is adjacent pneumocephalus. Fractures extend into the roof of the right orbit and planum sphenoidale. There are additional nondisplaced fractures involving the lateral wall of the right orbit, the right zygomatic arch and both nasal bones. The orbital apex is intact. The fracture may extend across the hard palate, although is nondisplaced. The pterygoid plates are intact.  The mastoid air cells and middle ears are clear. There is no displacement of the carotid canals. There is fluid throughout the paranasal sinuses.  IMPRESSION: 1. Stable epidural hematoma anteriorly in the right middle cranial fossa. Stable subarachnoid hemorrhage and right frontal lobe hemorrhagic contusion. 2. No evidence of hydrocephalus or infarct. 3. Stable skullbase, facial and right temporal fractures.   Electronically Signed   By: Carey Bullocks M.D.   On: 01/31/2015 10:11   Dg Chest Port 1 View  02/01/2015   ADDENDUM REPORT: 02/01/2015 07:59  ADDENDUM: In the body of the report it should read no pneumothorax.   Electronically Signed   By: Maisie Fus  Register   On: 02/01/2015 07:59   02/01/2015   CLINICAL DATA:  Respiratory failure.  EXAM: PORTABLE CHEST - 1 VIEW  COMPARISON:  CT 01/31/2015.  Chest x-ray 01/31/2015.  FINDINGS: Endotracheal tube now noted 1.3 cm above the carina. NG tube noted with tip below the hemidiaphragm. Low lung volumes with mild bibasilar subsegmental atelectasis. Pneumothorax. Heart size normal.  IMPRESSION: 1. Endotracheal tube tip now noted 1.3 cm above the carina. Slight proximal repositioning should be considered. NG tube in stable position. 2. Low lung volumes with mild bibasilar subsegmental atelectasis . These results will be called to the ordering clinician or representative by the Radiologist Assistant, and communication documented in the PACS or zVision Dashboard.  Electronically Signed: By: Maisie Fus  Register On: 02/01/2015 07:38     Assessment/Plan: Neurologically good. Substantial progress over the past 24 hours. I will be out of town until August 22, Dr. Tressie Stalker will follow the patient in the meantime. We'll check CT of brain without contrast in a couple of days.   Hewitt Shorts, MD 02/02/2015, 8:20 AM
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
## 2015-02-03 NOTE — Progress Notes (Signed)
Patient ID: Laurie Booker, female   DOB: March 06, 1966, 49 y.o.   MRN: [id number]  LOS: 3 days   Subjective: Headaches.  No acute vision changes.  Getting OOB.  Appetite is okay.  Voiding.  Passing flatus.   Objective: Vital signs in last 24 hours: Temp:  [98.1 F (36.7 C)-99.2 F (37.3 C)] 99.1 F (37.3 C) (08/10 0920) Pulse Rate:  [53-66] 59 (08/10 0920) Resp:  [14-18] 16 (08/10 0920) BP: (102-125)/(54-79) 120/54 mmHg (08/10 0920) SpO2:  [97 %-100 %] 98 % (08/10 0920) Last BM Date: 02/01/15  Lab Results:  CBC  Recent Labs  02/01/15 0522 02/02/15 0213  WBC 14.5* 13.1*  HGB 10.6* 10.1*  HCT 31.8* 31.4*  PLT 208 203   BMET  Recent Labs  02/01/15 0522 02/02/15 0213  NA 138 140  K 4.2 4.1  CL 106 111  CO2 23 23  GLUCOSE 166* 154*  BUN 6 <5*  CREATININE 0.74 0.41*  CALCIUM 8.7* 8.8*    Imaging: No results found.   PE: General appearance: alert, cooperative and no distress HEENT: right periorbital hematoma Resp: clear to auscultation bilaterally Cardio: regular rate and rhythm GI: soft, non-tender Extremities: extremities normal, atraumatic, no cyanosis or edema Neurologic: Grossly normal  Patient Active Problem List   Diagnosis Date Noted  . SAH (subarachnoid hemorrhage) 01/31/2015    Assessment/Plan: Fall TBI/R frontal ICC/EDH/SAH/SDH - stable. HCT improved. Dr. Newell Coral following HCT in AM. TBI team.  Vent dependent resp failure - resolved R skullbase, temporal bone, orbit, zygoma, maxillary sinus and sphenoid sinus FXs - non-op per Dr. Jenne Pane ETOH abuse - CIWA d/c'd.  No issues.  FEN - fulls, advance diet as tolerated.  Reduce IVF.  VTE - PAS.  Dispo - CTH tomorrow. Therapies recs OP PT/OT/SLP    Ashok Norris, ANP-BC Pager: [phone] General Trauma PA Pager: [phone]   02/03/2015 9:41 AM

## 2015-02-03 NOTE — Progress Notes (Signed)
Patient ID: Laurie Booker, female   DOB: 1966-04-09, 49 y.o.   MRN: [id number] Subjective:  The patient is alert and pleasant. She is in no apparent distress. She has no complaints. She wants to go home tomorrow.  Objective: Vital signs in last 24 hours: Temp:  [98.7 F (37.1 C)-99.4 F (37.4 C)] 99.4 F (37.4 C) (08/10 1419) Pulse Rate:  [53-63] 58 (08/10 1419) Resp:  [16-18] 16 (08/10 1419) BP: (107-120)/(54-71) 120/71 mmHg (08/10 1419) SpO2:  [97 %-100 %] 97 % (08/10 1419)  Intake/Output from previous day: 08/09 0701 - 08/10 0700 In: 450 [I.V.:450] Out: 650 [Urine:650] Intake/Output this shift:    Physical exam the patient is alert and oriented 3. Glasgow Coma Scale 15. Her pupils are equal and reactive. Her speech is normal. She is moving all 4 extremities well. I don't see any evidence of CSF otorrhea. She has right periorbital ecchymosis.  Lab Results:  Recent Labs  02/01/15 0522 02/02/15 0213  WBC 14.5* 13.1*  HGB 10.6* 10.1*  HCT 31.8* 31.4*  PLT 208 203   BMET  Recent Labs  02/01/15 0522 02/02/15 0213  NA 138 140  K 4.2 4.1  CL 106 111  CO2 23 23  GLUCOSE 166* 154*  BUN 6 <5*  CREATININE 0.74 0.41*  CALCIUM 8.7* 8.8*    Studies/Results: No results found.  Assessment/Plan: Traumatic brain injury, skull fracture, small epidural hematoma: The patient is doing well clinically. The tentative plan is to peter CAT scan and if it looks good discharge her tomorrow. I have answered all the patient's questions.  LOS: 3 days     Artemisa Sladek D 02/03/2015, 4:58 PM
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
## 2015-02-04 ENCOUNTER — Inpatient Hospital Stay (HOSPITAL_COMMUNITY): Payer: PRIVATE HEALTH INSURANCE

## 2015-02-04 LAB — CBC
HCT: 30.1 % — ABNORMAL LOW (ref 36.0–46.0)
Hemoglobin: 9.9 g/dL — ABNORMAL LOW (ref 12.0–15.0)
MCH: 29.9 pg (ref 26.0–34.0)
MCHC: 32.9 g/dL (ref 30.0–36.0)
MCV: 90.9 fL (ref 78.0–100.0)
Platelets: 227 10*3/uL (ref 150–400)
RBC: 3.31 MIL/uL — ABNORMAL LOW (ref 3.87–5.11)
RDW: 12.9 % (ref 11.5–15.5)
WBC: 7.7 10*3/uL (ref 4.0–10.5)

## 2015-02-04 LAB — BASIC METABOLIC PANEL
Anion gap: 9 (ref 5–15)
BUN: 7 mg/dL (ref 6–20)
CO2: 27 mmol/L (ref 22–32)
Calcium: 9.2 mg/dL (ref 8.9–10.3)
Chloride: 101 mmol/L (ref 101–111)
Creatinine, Ser: 0.64 mg/dL (ref 0.44–1.00)
GFR calc Af Amer: >60 mL/min (ref >60)
GFR calc non Af Amer: >60 mL/min (ref >60)
Glucose, Bld: 120 mg/dL — ABNORMAL HIGH (ref 65–99)
Potassium: 3.6 mmol/L (ref 3.5–5.1)
Sodium: 137 mmol/L (ref 135–145)

## 2015-02-04 LAB — URINALYSIS, ROUTINE W REFLEX MICROSCOPIC
Bilirubin Urine: NEGATIVE
Glucose, UA: NEGATIVE mg/dL
Ketones, ur: NEGATIVE mg/dL
Nitrite: NEGATIVE
Protein, ur: NEGATIVE mg/dL
Specific Gravity, Urine: 1.014 (ref 1.005–1.030)
Urobilinogen, UA: 0.2 mg/dL (ref 0.0–1.0)
pH: 7 (ref 5.0–8.0)

## 2015-02-04 LAB — URINE MICROSCOPIC-ADD ON

## 2015-02-04 NOTE — Progress Notes (Signed)
Patient ID: Laurie Booker, female   DOB: 1966/03/21, 49 y.o.   MRN: [id number]  Fevers; check UA and CXR.  Likely from brain injury.  Will keep overnight.  If she remains stable, DC in AM.   Denim Kalmbach, ANP-BC
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
## 2015-02-04 NOTE — Progress Notes (Addendum)
Attempt to collect urine sample, patient removed hat from toilet and continue to verbalized that "I thought you guys already collect it". RN verbalized once again that MD need a urine sample. Patient is now proceeding that she is requesting to go home due to her daughter leaving for college in the AM. Trauma MD on call paged. Dr. Donell Beers called back with no order at this time. Offered patient AMA paper, patient/family ok to stay tonight but ask to be discharge as early as possible tomorrow. Will continue to monitor.     Sim Boast, RN

## 2015-02-04 NOTE — Care Management Note (Signed)
Case Management Note  Patient Details  Name: Laurie Booker MRN: [id number] Date of Birth: 1966/01/14  Subjective/Objective:    Pt for dc home today with friend.  Pt agreeable to OP follow PT/OT and ST at dc, as recommended by therapists.  Will need cane for home.                  Action/Plan: Completed referral form for OP therapies faxed to [facility] [phone]).  Referral to [facility] for DME needs.    Expected Discharge Date:    02/04/15              Expected Discharge Plan:  Home/Self Care  In-House Referral:  Clinical Social Work  Discharge planning Services  CM Consult  Post Acute Care Choice:    Choice offered to:     DME Arranged:  Gilmer Mor DME Agency:  [facility]  HH Arranged:    HH Agency:     Status of Service:  Completed, signed off  Medicare Important Message Given:    Date Medicare IM Given:    Medicare IM give by:    Date Additional Medicare IM Given:    Additional Medicare Important Message give by:     If discussed at Long Length of Stay Meetings, dates discussed:    Additional Comments:  Quintella Baton, RN, BSN  Trauma/Neuro ICU Case Manager [phone]

## 2015-02-04 NOTE — Progress Notes (Signed)
Patient ID: Laurie Booker, female   DOB: [birth year], 49 y.o.   MRN: [id number]  LOS: 4 days   Subjective: Light sensitivity and headache.  Drinking fluids, but little solids. No weakness, vision changes.  Wants to go home.   Objective: Vital signs in last 24 hours: Temp:  [98.9 F (37.2 C)-99.4 F (37.4 C)] 99.1 F (37.3 C) (08/11 0554) Pulse Rate:  [58-65] 62 (08/11 0554) Resp:  [16-20] 18 (08/11 0554) BP: (120-131)/(53-74) 131/53 mmHg (08/11 0554) SpO2:  [96 %-98 %] 98 % (08/11 0554) Last BM Date: 02/01/15  Lab Results:  CBC  Recent Labs  02/02/15 0213 02/04/15 0710  WBC 13.1* 7.7  HGB 10.1* 9.9*  HCT 31.4* 30.1*  PLT 203 227   BMET  Recent Labs  02/02/15 0213 02/04/15 0710  NA 140 137  K 4.1 3.6  CL 111 101  CO2 23 27  GLUCOSE 154* 120*  BUN <5* 7  CREATININE 0.41* 0.64  CALCIUM 8.8* 9.2    Imaging: Ct Head Wo Contrast  02/04/2015   CLINICAL DATA:  Followup traumatic brain injury  EXAM: CT HEAD WITHOUT CONTRAST  TECHNIQUE: Contiguous axial images were obtained from the base of the skull through the vertex without intravenous contrast.  COMPARISON:  01/31/2015, 02/01/2015  FINDINGS: Bony calvarium again demonstrates a depressed fracture of the temporal bone on the right. This extends into the skull base involving the superior aspect of the right orbit as well as the zygomatic arch on the right. No new focal fractures are seen. Mild proptosis of the right eye is noted and stable.  The epidural hematoma in the right middle cranial fossa along the anterior aspect of temporal lobe is again identified measuring 2.0 x 2.5 cm in greatest AP and transverse dimensions. This is stable in appearance from the prior exam. An area of parenchymal hemorrhage is noted within the inferior aspect of the right frontal lobe best seen on image number 8 measuring 12 mm which given some difference in the imaging technique is likely stable from the previous exam. Some surrounding edema is  noted in this region. Areas of subarachnoid hemorrhage are again noted extending into the sylvian fissure as well as the basal cisterns although the degree of hemorrhage has decreased in the interval from the prior exam. The right temporal all subdural hematoma is noted with a small amount of pneumocephalus. The degree of pneumocephalus has decreased slightly in the interval from the prior exam. No new focal area of hemorrhage is identified. No significant midline shift is seen.  IMPRESSION: Stable right skull fractures as described.  Stable areas of epidural and subdural hemorrhage on the right. Subarachnoid hemorrhage is again identified although the severity is somewhat decreased when compared with the prior exam. No new focal area of hemorrhage is noted.  Continued followup is recommended.   Electronically Signed   By: Alcide Clever M.D.   On: 02/04/2015 08:06    PE: General appearance: alert, cooperative and no distress HEENT: right periorbital hematoma Resp: clear to auscultation bilaterally Cardio: regular rate and rhythm GI: soft, non-tender Extremities: extremities normal, atraumatic, no cyanosis or edema Neurologic: Grossly normal   Patient Active Problem List   Diagnosis Date Noted  . SAH (subarachnoid hemorrhage) 01/31/2015     Assessment/Plan: Fall TBI/R frontal ICC/EDH/SAH/SDH - stable. HCT improved. Dr. Newell Coral following. TBI team.  Vent dependent resp failure - resolved R skullbase, temporal bone, orbit, zygoma, maxillary sinus and sphenoid sinus FXs - non-op per Dr. Jenne Pane ETOH  abuse - CIWA d/c'd. No issues.  FEN - DC IVF VTE - PAS.  Dispo - arrange OP PT/OT/SLP.  DC if okay with neurosurgery    Ashok Norris, ANP-BC Pager: [phone] General Trauma PA Pager: [phone]   02/04/2015 9:02 AM

## 2015-02-04 NOTE — Progress Notes (Signed)
Patient ID: Laurie Booker, female   DOB: [birth year], 49 y.o.   MRN: [id number] Subjective:  The patient is alert and pleasant. She is in no apparent distress. She wants to go home.  Objective: Vital signs in last 24 hours: Temp:  [98.9 F (37.2 C)-100.4 F (38 C)] 100 F (37.8 C) (08/11 1329) Pulse Rate:  [58-65] 64 (08/11 1329) Resp:  [16-20] 16 (08/11 1329) BP: (120-131)/(53-74) 127/66 mmHg (08/11 1329) SpO2:  [96 %-98 %] 97 % (08/11 1329)  Intake/Output from previous day:   Intake/Output this shift: Total I/O In: 480 [P.O.:480] Out: -   Physical exam the patient is alert and oriented 3. Glasgow Coma Scale 15. The patient's speech is normal. Her pupils are equal. Her strength is normal.  I reviewed the patient's follow-up head CT performed today at [facility]. It demonstrates she has a right anterior middle fossa epidural hematoma, right cerebral contusions, subarachnoid hemorrhage, subdural hematoma, without change from her prior study.  Lab Results:  Recent Labs  02/02/15 0213 02/04/15 0710  WBC 13.1* 7.7  HGB 10.1* 9.9*  HCT 31.4* 30.1*  PLT 203 227   BMET  Recent Labs  02/02/15 0213 02/04/15 0710  NA 140 137  K 4.1 3.6  CL 111 101  CO2 23 27  GLUCOSE 154* 120*  BUN <5* 7  CREATININE 0.41* 0.64  CALCIUM 8.8* 9.2    Studies/Results: Ct Head Wo Contrast  02/04/2015   CLINICAL DATA:  Followup traumatic brain injury  EXAM: CT HEAD WITHOUT CONTRAST  TECHNIQUE: Contiguous axial images were obtained from the base of the skull through the vertex without intravenous contrast.  COMPARISON:  01/31/2015, 02/01/2015  FINDINGS: Bony calvarium again demonstrates a depressed fracture of the temporal bone on the right. This extends into the skull base involving the superior aspect of the right orbit as well as the zygomatic arch on the right. No new focal fractures are seen. Mild proptosis of the right eye is noted and stable.  The epidural hematoma in the right  middle cranial fossa along the anterior aspect of temporal lobe is again identified measuring 2.0 x 2.5 cm in greatest AP and transverse dimensions. This is stable in appearance from the prior exam. An area of parenchymal hemorrhage is noted within the inferior aspect of the right frontal lobe best seen on image number 8 measuring 12 mm which given some difference in the imaging technique is likely stable from the previous exam. Some surrounding edema is noted in this region. Areas of subarachnoid hemorrhage are again noted extending into the sylvian fissure as well as the basal cisterns although the degree of hemorrhage has decreased in the interval from the prior exam. The right temporal all subdural hematoma is noted with a small amount of pneumocephalus. The degree of pneumocephalus has decreased slightly in the interval from the prior exam. No new focal area of hemorrhage is identified. No significant midline shift is seen.  IMPRESSION: Stable right skull fractures as described.  Stable areas of epidural and subdural hemorrhage on the right. Subarachnoid hemorrhage is again identified although the severity is somewhat decreased when compared with the prior exam. No new focal area of hemorrhage is noted.  Continued followup is recommended.   Electronically Signed   By: Alcide Clever M.D.   On: 02/04/2015 08:06    Assessment/Plan: Traumatic brain injury, skull fracture, epidural hematoma, subdural hematoma, subarachnoid hemorrhage: I have discussed the situation with the patient and a friend. From the neurosurgical point  of view the patient can be discharged and follow-up with Dr. Newell Coral in a week or 2 in the office. I have answered all her questions. I will sign off. Please let me know if we can be of further assistance.  LOS: 4 days     Benjamim Harnish D 02/04/2015, 1:54 PM

## 2015-02-04 NOTE — Clinical Social Work Note (Signed)
Clinical Social Work Assessment  Patient Details  Name: Laurie Booker MRN: [id number] Date of Birth: 1966-05-22  Date of referral:  02/04/15               Reason for consult:  Substance Use/ETOH Abuse, Trauma                Permission sought to share information with:  Family Supports Permission granted to share information::  Yes, Verbal Permission Granted  Relationship::  Daughter  Contact Information:  at bedside only  Housing/Transportation Living arrangements for the past 2 months:  [place] of Information:  Patient, Adult Children, Other (Comment Required) (Family Friend) Patient Interpreter Needed:  None Criminal Activity/Legal Involvement Pertinent to Current Situation/Hospitalization:  No - Comment as needed Significant Relationships:  Adult Children, Friend Lives with:  Self Do you feel safe going back to the place where you live?  Yes Need for family participation in patient care:  Yes (Comment)  Care giving concerns:  Patient daughter and family friend at bedside.  Both state that patient will not be drinking alcohol at discharge and has a lot of friend support to assist patient and family at discharge.   Social Worker assessment / plan:  Holiday representative met with patient and patient family/friends at bedside to offer support and discuss patient plans at discharge.  Patient with little to no engagement in assessment process.  Patient did verbalize that she was awake and had no memory of the incident.  Patient daughter and family friend confirmed that patient lives at home and will return home with friends and family.    Clinical Social Worker inquired about current substance use.  Patient refused to answer at this time.  Patient family friend confirmed that patient will not be drinking alcohol at discharge and this was somewhat of an isolated event.  SBIRT completed on patient friend report - no direct interaction with patient due to refusal.  CSW signing  off.  Please reconsult if further needs arise prior to discharge.  Employment status:  Therapist, music:  Managed Care PT Recommendations:  No Follow Up Information / Referral to community resources:  SBIRT  Patient/Family's Response to care:  Patient and family verbalized understanding and role of CSW.  Patient does verbalize wishes for return home as soon as possible.  Denies the presence of nightmares and/or flashbacks.  No concerns expressed by patient and/or family.  Patient/Family's Understanding of and Emotional Response to Diagnosis, Current Treatment, and Prognosis:  Patient presents with embarrassment regarding the incident and is choosing silence for a coping mechanism.  Patient understanding of current needs and needs at discharge.  Emotional Assessment Appearance:  Appears older than stated age, Disheveled Attitude/Demeanor/Rapport:  Guarded, Lethargic, Apprehensive, Avoidant Affect (typically observed):  Calm, Guarded, Flat Orientation:  Oriented to Self, Oriented to Place, Oriented to  Time, Oriented to Situation Alcohol / Substance use:  Alcohol Use Psych involvement (Current and /or in the community):  No (Comment)  Discharge Needs  Concerns to be addressed:  Denies Needs/Concerns at this time Readmission within the last 30 days:  No Current discharge risk:  None Barriers to Discharge:  Continued Medical Work up  [employer], [place]

## 2015-02-05 MED ORDER — OXYCODONE HCL 5 MG PO TABS: 5.0000 mg | ORAL_TABLET | Freq: Four times a day (QID) | ORAL | Status: DC | PRN

## 2015-02-05 MED ORDER — ACETAMINOPHEN 325 MG PO TABS: 325.0000 mg | ORAL_TABLET | Freq: Four times a day (QID) | ORAL | Status: AC | PRN

## 2015-02-05 NOTE — Progress Notes (Signed)
Discharge orders received, pt for discharge home today,  D/C instructions and Rx given with verbalized understanding.  Family at bedside to assist pt with discharge. Staff brought pt downstairs via wheelchair.

## 2015-02-05 NOTE — Discharge Summary (Signed)
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 globe appears intact.  There is a fracture, nondisplaced, involving the floor of the right middle cranial fossa, a portion of which extends medially through the clivus. This is seen less conspicuously than its appearance on the head CT performed earlier today. There is hemorrhage within the sphenoid sinuses. There is hemorrhage within the right maxillary sinus. There is also hemorrhage within the ethmoid air cells bilaterally.  There is a mildly displaced left nasal bone fracture. The patient is intubated and there is an orogastric tube present.  IMPRESSION: Multiple complex facial bone and skullbase fractures. Hemorrhage and air in the right orbit causing proptosis. Fractures involving ethmoid and sphenoid sinuses. Intracranial hemorrhage as Dr. Clovis Riley discussed with Dr. Wilkie Aye earlier.   Electronically Signed   By: Esperanza Heir M.D.   On: 01/31/2015 07:40    Microbiology: Recent Results (from the past 240 hour(s))  MRSA PCR  Screening     Status: None   Collection Time: 01/31/15  8:15 AM  Result Value Ref Range Status   MRSA by PCR NEGATIVE NEGATIVE Final    Comment:        The GeneXpert MRSA Assay (FDA approved for NASAL specimens only), is one component of a comprehensive MRSA colonization surveillance program. It is not intended to diagnose MRSA infection nor to guide or monitor  treatment for MRSA infections.      Labs: Basic Metabolic Panel:  Recent Labs Lab 01/31/15 0324 02/01/15 0522 02/02/15 0213 02/04/15 0710  NA 133* 138 140 137  K 2.8* 4.2 4.1 3.6  CL 100* 106 111 101  CO2 21* 23 23 27   GLUCOSE 163* 166* 154* 120*  BUN 9 6 <5* 7  CREATININE 0.71 0.74 0.41* 0.64  CALCIUM 8.7* 8.7* 8.8* 9.2   Liver Function Tests:  Recent Labs Lab 01/31/15 0324  AST 23  ALT 16  ALKPHOS 63  BILITOT 0.5  PROT 6.9  ALBUMIN 4.1   No results for input(s): LIPASE, AMYLASE in the last 168 hours. No results for input(s): AMMONIA in the last 168 hours. CBC:  Recent Labs Lab 01/31/15 0324 02/01/15 0522 02/02/15 0213 02/04/15 0710  WBC 10.2 14.5* 13.1* 7.7  HGB 10.7* 10.6* 10.1* 9.9*  HCT 32.7* 31.8* 31.4* 30.1*  MCV 89.3 89.1 92.6 90.9  PLT 246 208 203 227   Cardiac Enzymes: No results for input(s): CKTOTAL, CKMB, CKMBINDEX, TROPONINI in the last 168 hours. BNP: BNP (last 3 results) No results for input(s): BNP in the last 8760 hours.  ProBNP (last 3 results) No results for input(s): PROBNP in the last 8760 hours.  CBG: No results for input(s): GLUCAP in the last 168 hours.  Active Problems:   SAH (subarachnoid hemorrhage)   Time coordinating discharge: <30 mins  Signed:  Malania Gawthrop, ANP-BC
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
## 2015-02-05 NOTE — Discharge Instructions (Signed)
Be careful taking Xanax with the oxycodone.  Start with a 1/2 tablet and if needed take the other 1/2 1-2 hours later due to over sedation when taking meds together.    Once your pain improves, take tylenol.  Avoid aspirin, motrin/aleve/ibuprofen until you see the neurosurgeon.  Subarachnoid Hemorrhage Subarachnoid hemorrhage is bleeding in the area between the brain and the membrane that covers the brain (subarachnoid space). This increases the pressure on the brain and causes some areas of the brain to be deprived of blood flow. Subarachnoid hemorrhage is a medical emergency that may cause permanent brain damage, stroke, or even death if not treated.   Subdural Hematoma A subdural hematoma is a collection of blood between the brain and its tough outermost membrane covering (the dura). Blood clots that form in this area push down on the brain and cause irritation. A subdural hematoma may cause parts of the brain to stop working and eventually cause death.  CAUSES A subdural hematoma is caused by bleeding from a ruptured blood vessel (hemorrhage). The bleeding results from trauma to the head, such as from a fall or motor vehicle accident. There are two types of subdural hemorrhages:  Acute. This type develops shortly after a serious blow to the head and causes blood to collect very quickly. If not diagnosed and treated promptly, severe brain injury or death can occur.  Chronic. This is when bleeding develops more slowly, over weeks or months. RISK FACTORS People at risk for subdural hematoma include older persons, infants, and alcoholics. SYMPTOMS An acute subdural hemorrhage develops over minutes to hours. Symptoms can include:  Temporary loss of consciousness.  Weakness of arms or legs on one side of the body.  Changes in vision or speech.  A severe headache.  Seizures.  Nausea and vomiting.  Increased sleepiness. A chronic subdural hemorrhage develops over weeks to months.  Symptoms may develop slowly and produce less noticeable problems or changes. Symptoms include:  A mild headache.  A change in personality.  Loss of balance or difficulty walking.  Weakness, numbness, or tingling in the arms or legs.  Nausea or vomiting.  Memory loss.  Double vision.  Increased sleepiness. DIAGNOSIS Your health care provider will perform a thorough physical and neurological exam. A CT scan or MRI may also be done. If there is blood on the scan, its color will help your health care provider determine how long the hemorrhage has been there. TREATMENT If the cause is an acute subdural hemorrhage, immediate treatment is needed. In many cases an emergency surgery is performed to drain accumulated blood or to remove the blood clot. Sometimes steroid or diuretic medicines or controlled breathing through a ventilator is needed to decrease pressure in the brain. This is especially true if there is any swelling of the brain. If the cause is a chronic subdural hemorrhage, treatment depends on a variety of factors. Sometimes no treatment is needed. If the subdural hematoma is small and causes minimal or no symptoms, you may be treated with bed rest, medicines, and observation. If the hemorrhage is large or if you have neurological symptoms, an emergency surgery is usually needed to remove the blood clot. People who develop a subdural hemorrhage are at risk of developing seizures, even after the subdural hematoma has been treated. You may be prescribed an anti-seizure (anticonvulsant) medicine for a year or longer. HOME CARE INSTRUCTIONS  Only take medicines as directed by your health care provider.  Rest if directed by your health care  provider.  Keep all follow-up appointments with your health care provider.  If you play a contact sport such as football, hockey or soccer and you experienced a significant head injury, allow enough time for healing (up to 15 days) before you start  playing again. A repeated injury that occurs during this fragile repair period is likely to result in hemorrhage. This is called the second impact syndrome. SEEK IMMEDIATE MEDICAL CARE IF:  You fall or experience minor trauma to your head and you are taking blood thinners. If you are on any blood thinners even a very small injury can cause a subdural hematoma. You should not hesitate to seek medical attention regardless of how minor you think your symptoms are.  You experience a head injury and have:  Drowsiness or a decrease in alertness.  Confusion or forgetfulness.  Slurred speech.  Irrational or aggressive behavior.  Numbness or paralysis in any part of the body.  A feeling of being sick to your stomach (nauseous) or you throw up (vomit).  Difficulty walking or poor coordination.  Double vision.  Seizures.  A bleeding disorder.  A history of heavy alcohol use.  Clear fluid draining from your nose or ears.  Personality changes.  Difficulty thinking.  Worsening symptoms. MAKE SURE YOU:  Understand these instructions.  Will watch your condition.  Will get help right away if you are not doing well or get worse. FOR MORE INFORMATION National Institute of Neurological Disorders and Stroke: [employer].co.uk American Association of Neurological Surgeons: www.neurosurgerytoday.org American Academy of Neurology (AAN): ComparePet.cz Brain Injury Association of America: www.biausa.org Document Released: 04/29/2004 Document Revised: 04/02/2013 Document Reviewed: 12/13/2012 [facility] Patient Information 2015 [place], [place]. This information is not intended to replace advice given to you by your health care provider. Make sure you discuss any questions you have with your health care provider.  CAUSES   Head injury.   Ruptured brain aneurysm.   Bleeding from blood vessels that develop abnormally (arteriovenous malformation).   Bleeding disorder.   Use of blood  thinners (anticoagulants).  Use of certain drugs, such as cocaine. For some people with subarachnoid hemorrhage, the cause is unknown.  RISK FACTORS  Smoking.  Having high blood pressure (hypertension).  Abusing alcohol.  Being a female, especially being of post-menopausal age.  Having a family history of disease in the blood vessels of the brain (cerebrovascular disease).  Having certain genetic syndromes that result in kidney disease or connective tissue disease. SIGNS AND SYMPTOMS   A sudden, severe headache with no known cause. The headache is often described as the worst headache ever experienced.  Nausea or vomiting, especially when combined with other symptoms such as a headache.  Sudden weakness or numbness of the face, arm, or leg, especially on one side of the body.  Sudden trouble walking or difficulty moving arms or legs.  Sudden confusion.  Sudden personality changes.  Trouble speaking (aphasia) or understanding.  Difficulty swallowing.  Sudden trouble seeing in one or both eyes.  Double vision.  Dizziness.  Loss of balance or coordination.  Intolerance to light.  Stiff neck. DIAGNOSIS  Your health care provider will perform a physical exam and ask about your symptoms. If a subarachnoid hemorrhage is suspected, various tests may be ordered. These tests may include:   A CT scan.  An MRI.  A cerebral angiogram.  A spinal tap (lumbar puncture).  Blood tests. TREATMENT  Immediate treatment in the hospital is often required to reduce the risk of brain damage. Treatment  will depend on the cause of the bleeding, where it is located, and the extent of the bleeding and damage. The goals of treatment include stopping the bleeding, repairing the cause of bleeding, providing relief of symptoms, and preventing problems.   Medicines may be given to:  Lower blood pressure (antihypertensives).  Relieve pain (analgesics).  Relieve nausea or  vomiting.  Surgery may also be needed to stop the bleeding, repair the cause of the bleeding, or remove the blood.  Rehabilitation may be needed to improve any cognitive and day-to-day functions impaired by the condition. Further treatment depends on the duration, severity, and cause of your symptoms. Physical, speech, and occupational therapists will assess you and work to improve any functions impaired by the subarachnoid hemorrhage. Measures will be taken to prevent short-term and long-term problems, including infection from breathing foreign material into the lungs (aspiration pneumonia), blood clots in the legs, bedsores, and falls. HOME CARE INSTRUCTIONS After your hospitalization or inpatient rehabilitation is completed and you are well enough to go home, it is important to prevent a reoccurrence. Take these steps to help prevent this:  Take medicines only as directed by your health care provider.  If swallow studies have determined that your swallowing reflex is present, you should eat healthy foods. A diet low in salt (sodium), saturated fat, trans fat, and cholesterol may be recommended to manage high blood pressure. Foods may need to be a special consistency (soft or pureed), or small bites may need to be taken in order to avoid aspirating or choking.  Rest and limit activities or movements as directed by your health care provider.  Do not use any tobacco products including cigarettes, chewing tobacco, or electronic cigarettes. If you need help quitting, ask your health care provider.  Limit alcohol intake to no more than 1 drink per day for nonpregnant women and 2 drinks per day for men. One drink equals 12 ounces of beer, 5 ounces of wine, or 1 ounces of hard liquor.  Make any other lifestyle changes as directed by your health care provider.  Monitor and record your blood pressure as directed by your health care provider.  A safe home environment is important to reduce the risk  of falls. Your health care provider may arrange for specialists to evaluate your home. Having grab bars in the bedroom and bathroom is often important. Your health care provider may arrange for special equipment to be used at home, such as raised toilets and a seat for the shower.  Physical, occupational, and speech therapy. Ongoing therapy may be needed to maximize your recovery after a subarachnoid bleed. If you have been advised to use a walker or a cane, use it at all times. Be sure to keep your therapy appointments.  Keep all follow-up visits with your health care provider and other specialists. This includes any referrals, physical therapy, and rehabilitation. SEEK IMMEDIATE MEDICAL CARE IF:   You suddenly have a sudden, severe headache with no known cause.  You have nausea or vomiting occurring with another symptom.  You have sudden weakness or numbness of the face, arm, or leg, especially on one side of the body.  You have sudden trouble walking or difficulty moving arms or legs.  You have sudden confusion.  You have trouble speaking (aphasia) or understanding.  You have sudden trouble seeing in one or both eyes.  You have a sudden loss of balance or coordination.  You have a stiff neck.  You have difficulty breathing.  You have a partial or total loss of consciousness. Any of these symptoms may represent a serious problem that is an emergency. Do not wait to see if the symptoms will go away. Get medical help right away. Call your local emergency services (911 in [place] Do not drive yourself to the hospital. MAKE SURE YOU:   Understand these instructions.  Will watch your condition.  Will get help right away if you are not doing well or get worse. Document Released: 04/29/2004 Document Revised: 10/27/2013 Document Reviewed: 07/26/2012 [facility] Patient Information 2015 [place], [place]. This information is not intended to replace advice given to you by your health care  provider. Make sure you discuss any questions you have with your health care provider.

## 2015-02-11 ENCOUNTER — Telehealth (HOSPITAL_COMMUNITY): Payer: Self-pay

## 2015-02-11 NOTE — Telephone Encounter (Signed)
I spoke with the pt's dtr. She said pt had been doing well for ~3d on APAP but last night developed worsening HA that has persisted to today. I told her to call Dr. Earl Gala office and update them on the change in her condition as they may want to reimage her. She also notes some dysuria and I suggested an evaluation by her PCP. She is to call back if there are any problems or concerns.

## 2015-02-23 ENCOUNTER — Other Ambulatory Visit: Payer: Self-pay | Admitting: Neurosurgery

## 2015-02-23 ENCOUNTER — Other Ambulatory Visit: Payer: PRIVATE HEALTH INSURANCE

## 2015-02-23 DIAGNOSIS — S064X9A Epidural hemorrhage with loss of consciousness of unspecified duration, initial encounter: Secondary | ICD-10-CM

## 2015-02-23 DIAGNOSIS — S064XAA Epidural hemorrhage with loss of consciousness status unknown, initial encounter: Secondary | ICD-10-CM

## 2015-03-05 ENCOUNTER — Encounter: Payer: Self-pay | Admitting: Physical Medicine & Rehabilitation

## 2015-03-10 ENCOUNTER — Other Ambulatory Visit (HOSPITAL_COMMUNITY): Payer: Self-pay | Admitting: Otolaryngology

## 2015-03-12 ENCOUNTER — Encounter: Payer: Self-pay | Admitting: Occupational Therapy

## 2015-03-12 ENCOUNTER — Ambulatory Visit: Payer: PRIVATE HEALTH INSURANCE

## 2015-03-12 ENCOUNTER — Encounter: Payer: Self-pay | Admitting: Rehabilitation

## 2015-03-12 ENCOUNTER — Ambulatory Visit: Payer: PRIVATE HEALTH INSURANCE | Attending: Neurosurgery | Admitting: Rehabilitation

## 2015-03-12 ENCOUNTER — Ambulatory Visit: Payer: PRIVATE HEALTH INSURANCE | Admitting: Occupational Therapy

## 2015-03-12 DIAGNOSIS — S069X1A Unspecified intracranial injury with loss of consciousness of 30 minutes or less, initial encounter: Secondary | ICD-10-CM | POA: Insufficient documentation

## 2015-03-12 DIAGNOSIS — R41841 Cognitive communication deficit: Secondary | ICD-10-CM | POA: Insufficient documentation

## 2015-03-12 DIAGNOSIS — S0190XA Unspecified open wound of unspecified part of head, initial encounter: Secondary | ICD-10-CM | POA: Insufficient documentation

## 2015-03-12 DIAGNOSIS — R4189 Other symptoms and signs involving cognitive functions and awareness: Secondary | ICD-10-CM

## 2015-03-12 DIAGNOSIS — R279 Unspecified lack of coordination: Secondary | ICD-10-CM | POA: Insufficient documentation

## 2015-03-12 DIAGNOSIS — R269 Unspecified abnormalities of gait and mobility: Secondary | ICD-10-CM | POA: Insufficient documentation

## 2015-03-12 DIAGNOSIS — R2681 Unsteadiness on feet: Secondary | ICD-10-CM

## 2015-03-12 DIAGNOSIS — R531 Weakness: Secondary | ICD-10-CM

## 2015-03-12 DIAGNOSIS — Z7409 Other reduced mobility: Secondary | ICD-10-CM

## 2015-03-12 DIAGNOSIS — R42 Dizziness and giddiness: Secondary | ICD-10-CM | POA: Diagnosis present

## 2015-03-12 DIAGNOSIS — X58XXXA Exposure to other specified factors, initial encounter: Secondary | ICD-10-CM | POA: Insufficient documentation

## 2015-03-12 DIAGNOSIS — R278 Other lack of coordination: Secondary | ICD-10-CM
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
## 2015-03-12 NOTE — Therapy (Addendum)
[facility] [address] [place], [place], [address] Phone: [phone]   Fax:  [phone]  Speech Language Pathology Evaluation  Patient Details  Name: Laurie Booker MRN: [id number] Date of Birth: 1965/11/09 Referring Provider:  Shirlean Kelly, MD  Encounter Date: 03/12/2015    Past Medical History  Diagnosis Date  . Osteoporosis     No past surgical history on file.  There were no vitals filed for this visit.  Visit Diagnosis: Cognitive communication deficit - Plan: SLP plan of care cert/re-cert          SLP Evaluation [facility] - 03/12/15 1502    SLP Visit Information   SLP Received On 03/12/15   Onset Date 01-31-15   Medical Diagnosis TBI   Pain Assessment   Currently in Pain? Yes   Pain Score 5    Pain Location Head   Pain Orientation Posterior   Pain Type Chronic pain   Pain Radiating Towards neck   Pain Onset More than a month ago   Pain Frequency Intermittent   Pain Relieving Factors meds   Cognition   Overall Cognitive Status Impaired/Different from baseline   Area of Impairment Memory;Attention   Current Attention Level Alternating   Attention Comments in quiet environment of ST room   Memory Decreased short-term memory   Memory Comments Hopkins Verbal Learning Recall-8,7,7-not WNL, recognition -7/12 (not WNL)   Attention Alternating   Alternating Attention Impaired   Alternating Attention Impairment Verbal basic;Verbal complex   Behaviors Poor frustration tolerance  pt easily abandoned tasks that challenged her x2   Auditory Comprehension   Overall Auditory Comprehension Appears within functional limits for tasks assessed   Verbal Expression   Overall Verbal Expression Appears within functional limits for tasks assessed   Other Verbal Expression Comments pt reports anomia  - none noted during eval today   Oral Motor/Sensory Function   Overall Oral Motor/Sensory Function Appears within  functional limits for tasks assessed   Motor Speech   Overall Motor Speech Appears within functional limits for tasks assessed   Standardized Assessments   Standardized Assessments  --  Hopkins-8(wnl),7(wnl),7(not-wnl), recognition-8(outside WNL)                           SLP Short Term Goals - 03/12/15 1559    SLP SHORT TERM GOAL #1   Title pt will demo alternating attention during mod complex cognitive-linguistic tasks in ST room with modified independence (pt checking for errors)   Baseline WNL in simple tasks    Time 4   Period Weeks   Status New   SLP SHORT TERM GOAL #2   Title pt will tell SLP 6 memory strategies/compensations   Baseline none provided yet   Time 4   Period Weeks   Status New          SLP Long Term Goals - 03/12/15 1615    SLP LONG TERM GOAL #1   Title pt will utilize memory strategies during 4 therapy sessions   Baseline none demonstrated   Time 8   Period Weeks   Status New   SLP LONG TERM GOAL #2   Title pt will demo alternating attention in a mod distracting environment with modified independence (pt will self-correct/double check)   Baseline WNL alternating attention for simple tasks in quiet environment   Time 8   Period Weeks   Status New  Problem List Patient Active Problem List   Diagnosis Date Noted  . SAH (subarachnoid hemorrhage) 01/31/2015    [facility] , MS, CCC-SLP   03/16/2015, 10:49 AM  [facility] [address] [place], [place], [address] Phone: [phone]   Fax:  [phone]

## 2015-03-12 NOTE — Therapy (Signed)
[facility] [address] [place], [place], [address] Phone: [phone]   Fax:  [phone]  Occupational Therapy Evaluation  Patient Details  Name: Laurie Booker MRN: [id number] Date of Birth: 11-Jan-1966 Referring Provider:  Shirlean Kelly, MD  Encounter Date: 03/12/2015      OT End of Session - 03/12/15 1805    Visit Number 1   Number of Visits 9   Date for OT Re-Evaluation 05/11/15   Authorization Type [employer]   OT Start Time 1315   OT Stop Time 1400   OT Time Calculation (min) 45 min   Activity Tolerance Patient tolerated treatment well   Behavior During Therapy [facility] for tasks assessed/performed      Past Medical History  Diagnosis Date  . Osteoporosis     No past surgical history on file.  There were no vitals filed for this visit.  Visit Diagnosis:  Unsteadiness - Plan: Ot plan of care cert/re-cert  Decreased functional mobility and endurance - Plan: Ot plan of care cert/re-cert  Impaired cognition - Plan: Ot plan of care cert/re-cert  Decreased coordination - Plan: Ot plan of care cert/re-cert  Weakness generalized - Plan: Ot plan of care cert/re-cert      Subjective Assessment - 03/12/15 1329    Subjective  I want to get my mind right, and my balance right   Patient is accompained by: --  FRIEND-Brian   Pertinent History Bipolar, osteoporosis   Currently in Pain? Yes   Pain Score 5    Pain Location Head   Pain Orientation Posterior   Pain Descriptors / Indicators Aching   Pain Type Chronic pain   Pain Radiating Towards neck   Pain Onset More than a month ago   Pain Frequency Intermittent   Aggravating Factors  noise, light, stress   Pain Relieving Factors tylenol - I cannot take oxycodone           [facility] OT Assessment - 03/12/15 0001    Assessment   Diagnosis TBI   Onset Date 01/31/15   Prior Therapy Elbow 2005   Precautions   Precautions Fall   Restrictions   Weight  Bearing Restrictions No   Balance Screen   Has the patient fallen in the past 6 months Yes   How many times? 2   Has the patient had a decrease in activity level because of a fear of falling?  Yes   Is the patient reluctant to leave their home because of a fear of falling?  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, occupational   Leisure knitting, sewing   ADL   ADL comments Patient reports able to bathe and dress self now without assistance   IADL   Prior Level of Armed forces logistics/support/administrative officer for transportation;Needs to be accompanied on any shopping trip   Prior Level of Function Light Housekeeping independent   Light Housekeeping Performs light daily tasks but cannot maintain acceptable level of cleanliness   Prior Level of Function Meal Prep independent   Meal Prep Able to complete simple cold meal and snack prep   Prior Level of Function Community Mobility independent   Community Mobility Relies on family or friends for transportation   Financial Management Requires assistance  Mobility   Mobility Status Comments walks with cane, very slow gait pattern, unsteady on feet, limited head/neck rotation   Written Expression   Dominant Hand Right   Handwriting Increased time   Vision - History   Baseline Vision Bifocals   Patient Visual Report Eye fatigue/eye pain/headache   Vision Assessment   Eye Alignment Within Functional Limits   Alignment/Gaze Preference Within Defined Limits   Comment initially had diplopia in some fields, seems to be mostly resolved   Activity Tolerance   Activity Tolerance Tolerates 10-20 min activity with muiltiple rests   Activity Tolerance Comments patient verbalizes feeling exhausted after bathing and  dressing self.    Cognition   Overall Cognitive Status Impaired/Different from baseline   Area of Impairment Memory;Attention;Problem solving   Current Attention Level Selective   Attention Comments difficulty attending to task in busy environement.  tangential at times   Memory Decreased short-term memory   Memory Comments patient expressing frustration with memory - not remembering appointments, dates, details that normally would be easy to recall   Problem Solving Comments easily anxious, perseverates on issues and this leads to increased anxiety   Behaviors Perseveration;Poor frustration tolerance;Lability   Observation/Other Assessments   Focus on Therapeutic Outcomes (FOTO)  50/100 Physical FS primary measure   Sensation   Light Touch Appears Intact   Coordination   Gross Motor Movements are Fluid and Coordinated No   Fine Motor Movements are Fluid and Coordinated No   9 Hole Peg Test Right;Left   Right 9 Hole Peg Test 29.09   Left 9 Hole Peg Test 29.53   Tremors left hand greater than right hand; worsens without xanax   ROM / Strength   AROM / PROM / Strength AROM;Strength   AROM   Overall AROM  Within functional limits for tasks performed   Overall AROM Comments limitation to end range of shoulder flexion and abduction left greater than right   Strength   Overall Strength Deficits   Overall Strength Comments 4-/5 proximal shoulder   Hand Function   Right Hand Gross Grasp Impaired   Right Hand Grip (lbs) 45   Left Hand Gross Grasp Impaired   Left Hand Grip (lbs) 32                         OT Education - 03/12/15 1802    Education provided Yes   Education Details OT's role, brain injury recovery   Person(s) Educated Patient;Other (comment)  friend Arlys John   Methods Explanation   Comprehension Verbalized understanding          OT Short Term Goals - 03/12/15 1912    OT SHORT TERM GOAL #3   Title Patient will attend to organiztional task, e.g.  placing appointments into calendar in smart phone, in minimally distracting environment with no greater than minimal cueing.   OT SHORT TERM GOAL #4   Title Patient will prepare simple meal, completing two dishes simultaneously alternating her attention between these tasks with minimal cueing initially.   Baseline Currently - min assist   Time 4   Period Weeks   Status New           OT Long Term Goals - 03/12/15 1914    OT LONG TERM GOAL #1   Title Patient will be independent in updated HEP   Time 8   Period Weeks   Status New   OT LONG TERM GOAL #2   Title Patient  will complete simple housework for 45 minutes without break longer than 5 min   Baseline Unable to complete, bathing, dressing and household mobility significantly tax energy at this time - dependent for household cleaning   Time 8   Period Weeks   Status New   OT LONG TERM GOAL #3   Title Patient will actively divide her attention between two work related tasks in moderately distracting environment, e.g. typing specific text on computer, while answering phone and directing caller   Baseline Patient unable to work at this time - dependent   Time 8   Period Weeks   Status New   OT LONG TERM GOAL #4   Title Patient will prepare a full hot meal safely navigating in kitchen and using stove top and or oven independently   Baseline Patient is completing minimal cooking at this time due to mobilty deficits, and fatigue, anxiety - max assist   Time 8   Period Weeks   Status New               Plan - 03/12/15 1807    Clinical Impression Statement Patient is a 49 year old woman recently discharged home from the hospital following a fall with traumatic brain injury which occured 01/31/15.  Patient presents during OT evaluation with limited head/neck rotation, 5-6/10 headache, perseverative verbal tendencies, decreased selective attention, decreased memory, decreased self organizing behaviors, decreased activity tolerance,  and decreased balance and equilibrium.  Patient has had two falls since returning home from the  hospital, li\uckilly without injury.  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  Patient is relying on family and friends to complete IADL's and manage daily life skills at this time.  Patient wil benefit from skilled Occupational Therapy to help her return to independence with IADL's and explore her potential to return to work in some capacity.     Pt will benefit from skilled therapeutic intervention in order to improve on the following deficits (Retired) Decreased coordination;Decreased activity tolerance;Decreased balance;Decreased cognition;Decreased endurance;Decreased mobility;Decreased strength;Decreased safety awareness;Impaired UE functional use;Impaired vision/preception;Improper body mechanics;Pain;Impaired flexibility;Decreased range of motion   Rehab Potential Good   OT Frequency 1x / week  patient declined 2x/week due to financial concerns   OT Duration 8 weeks  +eval   OT Treatment/Interventions Self-care/ADL training;Cryotherapy;Moist Heat;Therapeutic exercise;Neuromuscular education;DME and/or AE instruction;Building services engineer;Therapeutic activities;Cognitive remediation/compensation;Visual/perceptual remediation/compensation;Patient/family education;Balance training   Plan Need more in depth vision assessment.  Multi tasking - IADL, financial management, schedule management   OT Home Exercise Plan Please initiate- needs proxiaml strengthening and conditioning    Recommended Other Services Neuropsych - have not yet discussed with patient   Consulted and Agree with Plan of Care Patient        Problem List Patient Active Problem List   Diagnosis Date Noted  . SAH (subarachnoid hemorrhage) 01/31/2015    Collier Salina,  OTR/L 03/12/2015, 7:30 PM  [facility] [address] [place], [place], [address] Phone: [phone]   Fax:  [phone]

## 2015-03-17 ENCOUNTER — Encounter (HOSPITAL_COMMUNITY): Payer: Self-pay

## 2015-03-17 ENCOUNTER — Encounter (HOSPITAL_COMMUNITY)
Admission: RE | Admit: 2015-03-17 | Discharge: 2015-03-17 | Disposition: A | Discharge status: Home / Self Care | Payer: PRIVATE HEALTH INSURANCE | Source: Ambulatory Visit | Attending: Otolaryngology | Admitting: Otolaryngology

## 2015-03-17 ENCOUNTER — Other Ambulatory Visit (HOSPITAL_COMMUNITY): Payer: Self-pay | Admitting: *Deleted

## 2015-03-17 DIAGNOSIS — J383 Other diseases of vocal cords: Secondary | ICD-10-CM | POA: Diagnosis not present

## 2015-03-17 DIAGNOSIS — I739 Peripheral vascular disease, unspecified: Secondary | ICD-10-CM | POA: Diagnosis not present

## 2015-03-17 DIAGNOSIS — F419 Anxiety disorder, unspecified: Secondary | ICD-10-CM | POA: Diagnosis not present

## 2015-03-17 DIAGNOSIS — F319 Bipolar disorder, unspecified: Secondary | ICD-10-CM | POA: Diagnosis not present

## 2015-03-17 DIAGNOSIS — Z79899 Other long term (current) drug therapy: Secondary | ICD-10-CM | POA: Diagnosis not present

## 2015-03-17 DIAGNOSIS — Z87820 Personal history of traumatic brain injury: Secondary | ICD-10-CM | POA: Diagnosis not present

## 2015-03-17 HISTORY — DX: Bipolar disorder, unspecified: F31.9

## 2015-03-17 HISTORY — DX: Unspecified intracranial injury with loss of consciousness of unspecified duration, initial encounter: S06.9X9A

## 2015-03-17 HISTORY — DX: Personal history of self-harm: Z91.5

## 2015-03-17 HISTORY — DX: Occlusion and stenosis of unspecified cerebral artery: I66.9

## 2015-03-17 HISTORY — DX: Major depressive disorder, single episode, unspecified: F32.9

## 2015-03-17 HISTORY — DX: Anxiety disorder, unspecified: F41.9

## 2015-03-17 HISTORY — DX: Chronic kidney disease, unspecified: N18.9

## 2015-03-17 HISTORY — DX: Depression, unspecified: F32.A

## 2015-03-17 HISTORY — DX: Personal history of other medical treatment: Z92.89

## 2015-03-17 HISTORY — DX: Unspecified intracranial injury with loss of consciousness status unknown, initial encounter: S06.9XAA

## 2015-03-17 LAB — HEPATIC FUNCTION PANEL
ALT: 14 U/L (ref 14–54)
AST: 16 U/L (ref 15–41)
Albumin: 4.2 g/dL (ref 3.5–5.0)
Alkaline Phosphatase: 73 U/L (ref 38–126)
Bilirubin, Direct: <0.1 mg/dL — ABNORMAL LOW (ref 0.1–0.5)
Total Bilirubin: 0.3 mg/dL (ref 0.3–1.2)
Total Protein: 7.6 g/dL (ref 6.5–8.1)

## 2015-03-17 LAB — CBC
HCT: 36.2 % (ref 36.0–46.0)
Hemoglobin: 11.6 g/dL — ABNORMAL LOW (ref 12.0–15.0)
MCH: 29.1 pg (ref 26.0–34.0)
MCHC: 32 g/dL (ref 30.0–36.0)
MCV: 91 fL (ref 78.0–100.0)
Platelets: 252 10*3/uL (ref 150–400)
RBC: 3.98 MIL/uL (ref 3.87–5.11)
RDW: 13.8 % (ref 11.5–15.5)
WBC: 8.2 10*3/uL (ref 4.0–10.5)

## 2015-03-17 LAB — BASIC METABOLIC PANEL
Anion gap: 5 (ref 5–15)
BUN: 6 mg/dL (ref 6–20)
CO2: 29 mmol/L (ref 22–32)
Calcium: 10.2 mg/dL (ref 8.9–10.3)
Chloride: 107 mmol/L (ref 101–111)
Creatinine, Ser: 0.81 mg/dL (ref 0.44–1.00)
GFR calc Af Amer: >60 mL/min (ref >60)
GFR calc non Af Amer: >60 mL/min (ref >60)
Glucose, Bld: 100 mg/dL — ABNORMAL HIGH (ref 65–99)
Potassium: 4 mmol/L (ref 3.5–5.1)
Sodium: 141 mmol/L (ref 135–145)

## 2015-03-17 MED ORDER — LIDOCAINE HCL (CARDIAC) 20 MG/ML IV SOLN
INTRAVENOUS | Status: AC
  Filled 2015-03-17: qty 5

## 2015-03-17 MED ORDER — DEXAMETHASONE SODIUM PHOSPHATE 10 MG/ML IJ SOLN
INTRAMUSCULAR | Status: AC
  Filled 2015-03-17: qty 1

## 2015-03-17 MED ORDER — ONDANSETRON HCL 4 MG/2ML IJ SOLN
INTRAMUSCULAR | Status: AC
  Filled 2015-03-17: qty 2

## 2015-03-17 NOTE — Progress Notes (Addendum)
Patient recently had a hospital admission in August 2016 for traumatic brain injury. Shonna Chock PA made aware.  Patient follows up with Dr. Newell Coral and is to see Dr. Hermelinda Medicus on October 4th.  She follows up with Dr. Lequita Halt for nephrology (in [facility]).  PCP is Raynelle Jan., MD

## 2015-03-17 NOTE — Progress Notes (Signed)
Anesthesia Note: Patient is a 49 year old female scheduled for micro direct laryngoscopy with CO2 laser excision of bilateral vocal cord granulomas, jet venturi ventilation on 03/19/15 by Dr. Jenne Pane.   History includes admission 01/31/15-02/05/15 for TBI with 1.6 cm depth epidural hematoma in the right anterior temporal region hemorrhage, SAH, small SDH, right skullbase, temporal bone, orbit, zygoma, maxillary sinus and sphenoid sinus fractures after she fell and struck her head against a mailbox while getting out of a cab intoxicated (ETOH level of 301). She became unresponsive in the ED and was intubated (extubated on 02/01/15). She was seen by neurosurgeon Dr. Newell Coral and followed with serial head CT scans. No surgical intervention required by discharge with 2 week follow-up recommended. She reports she has seen him twice with a follow-up head CT 2-3 weeks ago (see below). ENT Dr. Jenne Pane was also consulted due to facial fractures. Since fractures were non-displaced no surgical intervention was recommended. She developed hoarseness shortly after discharge and saw Dr. Jenne Pane in follow-up with the above procedure recommended. Apparently patient had this procedure in the past at [facility] following intubation after a suicide attempt. Other history includes depression, Bipolar disorder, CKD (numerous small renal cysts 01/2015 CT) that is followed by nephrologist Dr. Lequita Halt with [facility], osteoporosis, rhinoplasty. PCP is Dr. Joetta Manners with [facility].   Patient is now back at home. She does get occasional headaches, but nothing new or more severe since her last NS follow-up. No significant weakness or memory issues. She does carry a cane with her because occasionally she will have periods of disequilibrium. No visual changes.   Meds include Xanax, Lipitor, Lamictal, lithium, Prilosec.  02/01/15 EKG: NSR  02/04/15 CXR: IMPRESSION: No significant acute findings.  02/24/15 Follow-up head CT  (report in PACS Images):  1. Premature for age cerebellar atrophy, particularly involving the superior vermis, which could be consistent with ETOH abuse. Correlate clinically. Minor cerebral atrophy. 2. Parenchymal and subarachnoid hemorrhages have resolved. In addition, there is no visible residual right anterior middle cranial fossa epidural hematoma. Right sided skull base fractures are redemonstrated and stable. Right sided Zygoma fractures are redemonstrated and stable, without significant callus. 3. No interval development of hydrocephalus. No extra-axial subdural collections. No midline shift. No pneumocephalus is evident. No sinus air-fluid level is observed. Negative orbits. No significant mastoid fluid. Impression: Resolved right sided epidural, subarachnoid, and parenchymal hemorrhages. Premature for age cerebellar greater than cerebral atrophy. No significant sinus or mastoid air fluid level. Persistent skullbase and zygoma fractures without significant callus.  01/31/15 Maxillofacial CT: IMPRESSION: Multiple complex facial bone and skullbase fractures. Hemorrhage and air in the right orbit causing proptosis. Fractures involving ethmoid and sphenoid sinuses. Intracranial hemorrhage as Dr. Clovis Riley discussed with Dr. Wilkie Aye earlier.  Preoperative labs noted.   Discussed recent hospitalization and follow-up head CT findings with anesthesiologist Dr. Noreene Larsson. If no acute changes then it is anticipated that she can proceed as planned.  Velna Ochs [facility] Short Stay Center/Anesthesiology Phone [phone] 03/17/2015 11:31 AM

## 2015-03-17 NOTE — Pre-Procedure Instructions (Signed)
    Gennesis Hogland  03/17/2015      [facility] - [place], [place] - [address] [place] [address] Phone: [phone] Fax: [phone]    Your procedure is scheduled on Friday, March 19, 2015 at 10:00 AM.  Report to [facility] Entrance "A" Admitting Office at 8:00 AM.  Call this number if you have problems the morning of surgery: [phone]  Any questions prior to day of surgery, please call [phone] between 8 & 4 PM.   Remember:  Do not eat food or drink liquids after midnight Thursday, 03/18/15.  Take these medicines the morning of surgery with A SIP OF WATER: Omeprazole (Prilosec)   Do not wear jewelry, make-up or nail polish.  Do not wear lotions, powders, or perfumes.  You may wear deodorant.  Do not shave 48 hours prior to surgery.    Do not bring valuables to the hospital.  [facility] is not responsible for any belongings or valuables.  Contacts, dentures or bridgework may not be worn into surgery.  Leave your suitcase in the car.  After surgery it may be brought to your room.  For patients admitted to the hospital, discharge time will be determined by your treatment team.  Patients discharged the day of surgery will not be allowed to drive home.   Special instructions:  See "Preparing for Surgery" Instruction sheet.   Please read over the following fact sheets that you were given. Pain Booklet, Coughing and Deep Breathing and Surgical Site Infection Prevention

## 2015-03-18 ENCOUNTER — Ambulatory Visit: Payer: PRIVATE HEALTH INSURANCE

## 2015-03-18 ENCOUNTER — Ambulatory Visit: Payer: PRIVATE HEALTH INSURANCE | Admitting: Occupational Therapy

## 2015-03-18 ENCOUNTER — Encounter: Payer: Self-pay | Admitting: Occupational Therapy

## 2015-03-18 DIAGNOSIS — S069X1A Unspecified intracranial injury with loss of consciousness of 30 minutes or less, initial encounter: Secondary | ICD-10-CM | POA: Diagnosis not present

## 2015-03-18 DIAGNOSIS — R278 Other lack of coordination: Secondary | ICD-10-CM

## 2015-03-18 DIAGNOSIS — Z7409 Other reduced mobility: Secondary | ICD-10-CM

## 2015-03-18 DIAGNOSIS — R4189 Other symptoms and signs involving cognitive functions and awareness: Secondary | ICD-10-CM

## 2015-03-18 DIAGNOSIS — R279 Unspecified lack of coordination: Secondary | ICD-10-CM

## 2015-03-18 DIAGNOSIS — R41841 Cognitive communication deficit: Secondary | ICD-10-CM

## 2015-03-18 DIAGNOSIS — R531 Weakness: Secondary | ICD-10-CM

## 2015-03-18 NOTE — Patient Instructions (Signed)
  Please complete the assigned speech therapy homework and return it to your next session.  

## 2015-03-18 NOTE — Therapy (Signed)
[facility] [address] [place], [place], [address] Phone: [phone]   Fax:  [phone]  Speech Language Pathology Treatment  Patient Details  Name: Laurie Booker MRN: [id number] Date of Birth: 1965/11/04 Referring Provider:  Shirlean Kelly, MD  Encounter Date: 03/18/2015      End of Session - 03/18/15 0853    Visit Number 2   Number of Visits 16   Date for SLP Re-Evaluation 05/26/15   SLP Start Time 0806   SLP Stop Time  0846   SLP Time Calculation (min) 40 min   Activity Tolerance Patient tolerated treatment well      Past Medical History  Diagnosis Date  . Osteoporosis   . Blood clots in brain     d/t traumatic brain injury Aug 2016  . Traumatic brain injury     with loss of consciousness, intubated, at [facility] August 2016, closed, hemorrhage  . Anxiety   . Depression     behavioral health admission 2009 or 2010  . H/O suicide attempt 2009  . Bipolar disorder   . H/O transfusion of packed red blood cells   . Chronic kidney disease     follows up with Dr. Lequita Halt: nephrologist in [place]. Pt cannot state what is wrong with her kidneys; just that she has issues with them. (numerous renal cysts on 01/2015 CT)    Past Surgical History  Procedure Laterality Date  . Cesarean section    . Rhinoplasty    . Septoplasty    . Dilation and curettage of uterus    . Colonoscopy w/ polypectomy    . Vocal cord surgery      granulomas removed after intubation d/t suicide attempt    There were no vitals filed for this visit.  Visit Diagnosis: Cognitive communication deficit             ADULT SLP TREATMENT - 03/18/15 0832    General Information   Behavior/Cognition Alert;Cooperative;Pleasant mood   Treatment Provided   Treatment provided Cognitive-Linquistic   Pain Assessment   Pain Assessment 0-10   Pain Score 3    Pain Location rt post head behind rt ear, lt temporal region, top of head   Pain  Descriptors / Indicators Headache   Cognitive-Linquistic Treatment   Treatment focused on Cognition   Skilled Treatment SLP noted pt's verbosity in conversation. In verbal sequencing of simple multi-step tasks, pt required self correction 25% of the time.    Assessment / Recommendations / Plan   Plan Continue with current plan of care   Progression Toward Goals   Progression toward goals Progressing toward goals            SLP Short Term Goals - 03/18/15 1240    SLP SHORT TERM GOAL #1   Title pt will demo alternating attention during mod complex cognitive-linguistic tasks in ST room with modified independence (pt checking for errors)   Baseline WNL in simple tasks    Time 4   Period Weeks   Status On-going   SLP SHORT TERM GOAL #2   Title pt will tell SLP 6 memory strategies/compensations   Baseline none provided yet   Time 4   Period Weeks   Status On-going   SLP SHORT TERM GOAL #3   Title pt will demo emergent awareness and selective attention adequate to quell verbosity during therapy tasks with rare nonverbal cues   Time 4   Period Weeks   Status New  SLP Long Term Goals - 03/18/15 1240    SLP LONG TERM GOAL #1   Title pt will utilize memory strategies during 4 therapy sessions   Baseline none demonstrated   Time 8   Period Weeks   Status On-going   SLP LONG TERM GOAL #2   Title pt will demo alternating attention in a mod distracting environment with modified independence (pt will self-correct/double check)   Baseline WNL alternating attention for simple tasks in quiet environment   Time 8   Period Weeks   Status On-going   SLP LONG TERM GOAL #3   Title pt will demo emergent awareness, and attention appropriate for witholding extraneous conversation during therapy tasks, independently   Time 8   Period Weeks   Status New          Plan - 03/18/15 1237    Clinical Impression Statement SLP added goal for reduction of verbosity to improve  selective attention and pt emergent awareness. Skilled ST remains necessary to improve cognitive linguistic skills.   Speech Therapy Frequency 2x / week  pt will be scheduled x1/week until insurance info obtained per pt request   Duration --  8 weeks   Treatment/Interventions Internal/external aids;Compensatory strategies;Cognitive reorganization;SLP instruction and feedback;Patient/family education;Functional tasks;Cueing hierarchy   Potential to Achieve Goals Good        Problem List Patient Active Problem List   Diagnosis Date Noted  . SAH (subarachnoid hemorrhage) 01/31/2015    [facility] , MS, CCC-SLP  03/18/2015, 12:44 PM   [facility] [address] [place], [place], [address] Phone: [phone]   Fax:  [phone]

## 2015-03-18 NOTE — Therapy (Signed)
[facility] [address] [place], [place], [address] Phone: [phone]   Fax:  [phone]  Occupational Therapy Treatment  Patient Details  Name: Laurie Booker MRN: [id number] Date of Birth: 1965-11-13 Referring Provider:  Shirlean Kelly, MD  Encounter Date: 03/18/2015      OT End of Session - 03/18/15 0945    Visit Number 2   Number of Visits 9   Date for OT Re-Evaluation 05/11/15   Authorization Type [employer]   OT Start Time 0845   OT Stop Time 0931   OT Time Calculation (min) 46 min   Activity Tolerance Patient tolerated treatment well  pt was very emotional today      Past Medical History  Diagnosis Date  . Osteoporosis   . Blood clots in brain     d/t traumatic brain injury Aug 2016  . Traumatic brain injury     with loss of consciousness, intubated, at [facility] August 2016, closed, hemorrhage  . Anxiety   . Depression     behavioral health admission 2009 or 2010  . H/O suicide attempt 2009  . Bipolar disorder   . H/O transfusion of packed red blood cells   . Chronic kidney disease     follows up with Dr. Lequita Halt: nephrologist in [place]. Pt cannot state what is wrong with her kidneys; just that she has issues with them. (numerous renal cysts on 01/2015 CT)    Past Surgical History  Procedure Laterality Date  . Cesarean section    . Rhinoplasty    . Septoplasty    . Dilation and curettage of uterus    . Colonoscopy w/ polypectomy    . Vocal cord surgery      granulomas removed after intubation d/t suicide attempt    There were no vitals filed for this visit.  Visit Diagnosis:  Decreased functional mobility and endurance  Impaired cognition  Decreased coordination  Weakness generalized      Subjective Assessment - 03/18/15 0933    Pertinent History Bipolar, osteoporosis   Currently in Pain? Yes   Pain Score 4    Pain Location Head   Pain Orientation Posterior   Pain Descriptors /  Indicators Aching   Pain Type Acute pain   Pain Onset More than a month ago   Pain Frequency Intermittent   Aggravating Factors  noise. light, stress   Pain Relieving Factors meds, rest "when I can get it"                      OT Treatments/Exercises (OP) - 03/18/15 0934    Cognitive Exercises   Other Cognitive Exercises 1 Reviewed goals this morning with pt. Pt very emotional with rapid speech and difficulty attending. Allowed pt limited time to verbalize concerns then provided significant eduction regarding typical sequelae to TBI, including disruption in sleep patterns, lability, low frustration tolerance, inability to concentrate, fatigue, lack of organzation. Pt also educated on levels of attention, rationale behind goals and focus of treatment.  Pt with numerous medical questions and will see rehab dr for first time 03/30/2015 so utilized this task to address selective attention in minimal distraction evironment with emphasis on organization of question/concerns, strategies to improve attention for discussion with MD. Pt required mod vc's for redirection and to return to task. Pt also given written information regarding possibilty of working with neuropsychologist for adjustment and support. Pt verbalized that she thought this would be helpful - pt instructed to  call for appointment and pt able to verbalize understanding.                   OT Short Term Goals - 03/18/15 1610    OT SHORT TERM GOAL #1   Title Patient will be independent with a home exercise program   Baseline Patient is not currently exercising at this time   Period Weeks   Status On-going   OT SHORT TERM GOAL #2   Title Patient will demostrate sufficient activity tolerance to fold one load of clothing without rest break.   Baseline Patient relies on family and frineds to help with laundry as she is too fatigued to complete entire cycle since injury   Status On-going   OT SHORT TERM GOAL #3    Title Patient will attend to organiztional task, e.g. placing appointments into calendar in smart phone, in minimally distracting environment with no greater than minimal cueing.   Status On-going   OT SHORT TERM GOAL #4   Title Patient will prepare simple meal, completing two dishes simultaneously alternating her attention between these tasks with minimal cueing initially.   Baseline Currently - min assist   Time 4   Period Weeks   Status On-going           OT Long Term Goals - 03/18/15 0942    OT LONG TERM GOAL #1   Title Patient will be independent in updated HEP   Time 8   Period Weeks   Status On-going   OT LONG TERM GOAL #2   Title Patient will complete simple housework for 45 minutes without break longer than 5 min   Baseline Unable to complete, bathing, dressing and household mobility significantly tax energy at this time - dependent for household cleaning   Time 8   Period Weeks   Status On-going   OT LONG TERM GOAL #3   Title Patient will actively divide her attention between two work related tasks in moderately distracting environment, e.g. typing specific text on computer, while answering phone and directing caller   Baseline Patient unable to work at this time - dependent   Time 8   Period Weeks   Status On-going   OT LONG TERM GOAL #4   Title Patient will prepare a full hot meal safely navigating in kitchen and using stove top and or oven independently   Baseline Patient is completing minimal cooking at this time due to mobilty deficits, and fatigue, anxiety - max assist   Time 8   Period Weeks   Status On-going               Plan - 03/18/15 0943    Clinical Impression Statement Pt is making slow progress toward goals;  pt with signficant educaitonal needs related to diagnosis and sequelae as well as focus of therapy and return to more independent living.    Pt will benefit from skilled therapeutic intervention in order to improve on the following  deficits (Retired) Decreased coordination;Decreased activity tolerance;Decreased balance;Decreased cognition;Decreased endurance;Decreased mobility;Decreased strength;Decreased safety awareness;Impaired UE functional use;Impaired vision/preception;Improper body mechanics;Pain;Impaired flexibility;Decreased range of motion   Rehab Potential Good   OT Frequency 1x / week   OT Duration 8 weeks   OT Treatment/Interventions Self-care/ADL training;Cryotherapy;Moist Heat;Therapeutic exercise;Neuromuscular education;DME and/or AE instruction;Building services engineer;Therapeutic activities;Cognitive remediation/compensation;Visual/perceptual remediation/compensation;Patient/family education;Balance training   Plan more indepth vision assessment, selective attention using familiar moderately complex tasks wtih emphasis on organization, strengthening program for proximal strength and conditioning  Consulted and Agree with Plan of Care Patient        Problem List Patient Active Problem List   Diagnosis Date Noted  . SAH (subarachnoid hemorrhage) 01/31/2015    Norton Pastel, OTR/L 03/18/2015, 9:47 AM  [facility] [address] [place], [place], [address] Phone: [phone]   Fax:  [phone]

## 2015-03-19 ENCOUNTER — Encounter (HOSPITAL_COMMUNITY)
Admission: RE | Disposition: A | Discharge status: Home / Self Care | Payer: Self-pay | Source: Ambulatory Visit | Attending: Otolaryngology

## 2015-03-19 ENCOUNTER — Encounter (HOSPITAL_COMMUNITY): Payer: Self-pay | Admitting: *Deleted

## 2015-03-19 ENCOUNTER — Observation Stay (HOSPITAL_COMMUNITY)
Admission: RE | Admit: 2015-03-19 | Discharge: 2015-03-20 | Disposition: A | Discharge status: Home / Self Care | Payer: PRIVATE HEALTH INSURANCE | Source: Ambulatory Visit | Attending: Otolaryngology | Admitting: Otolaryngology

## 2015-03-19 ENCOUNTER — Ambulatory Visit (HOSPITAL_COMMUNITY): Payer: PRIVATE HEALTH INSURANCE | Admitting: Anesthesiology

## 2015-03-19 ENCOUNTER — Ambulatory Visit (HOSPITAL_COMMUNITY): Payer: PRIVATE HEALTH INSURANCE | Admitting: Vascular Surgery

## 2015-03-19 DIAGNOSIS — F419 Anxiety disorder, unspecified: Secondary | ICD-10-CM | POA: Insufficient documentation

## 2015-03-19 DIAGNOSIS — F319 Bipolar disorder, unspecified: Secondary | ICD-10-CM | POA: Insufficient documentation

## 2015-03-19 DIAGNOSIS — J383 Other diseases of vocal cords: Principal | ICD-10-CM | POA: Diagnosis present

## 2015-03-19 DIAGNOSIS — I739 Peripheral vascular disease, unspecified: Secondary | ICD-10-CM | POA: Insufficient documentation

## 2015-03-19 DIAGNOSIS — Z87820 Personal history of traumatic brain injury: Secondary | ICD-10-CM | POA: Insufficient documentation

## 2015-03-19 DIAGNOSIS — Z79899 Other long term (current) drug therapy: Secondary | ICD-10-CM | POA: Insufficient documentation

## 2015-03-19 HISTORY — PX: MICROLARYNGOSCOPY WITH CO2 LASER AND EXCISION OF VOCAL CORD LESION: SHX5970

## 2015-03-19 SURGERY — MICROLARYNGOSCOPY WITH CO2 LASER AND EXCISION OF VOCAL CORD LESION
Anesthesia: General | Site: Mouth

## 2015-03-19 MED ORDER — MORPHINE SULFATE (PF) 2 MG/ML IV SOLN
2.0000 mg | INTRAVENOUS | Status: DC | PRN
  Administered 2015-03-19 – 2015-03-20 (×3): 2 mg via INTRAVENOUS
  Filled 2015-03-19 (×3): qty 1

## 2015-03-19 MED ORDER — PROPOFOL 10 MG/ML IV BOLUS
INTRAVENOUS | Status: DC | PRN
  Administered 2015-03-19: 120 mg via INTRAVENOUS

## 2015-03-19 MED ORDER — KCL IN DEXTROSE-NACL 20-5-0.45 MEQ/L-%-% IV SOLN
INTRAVENOUS | Status: DC
  Administered 2015-03-19: 13:00:00 via INTRAVENOUS

## 2015-03-19 MED ORDER — OXYCODONE HCL 5 MG PO TABS: 5.0000 mg | ORAL_TABLET | Freq: Once | ORAL | Status: DC | PRN

## 2015-03-19 MED ORDER — GLYCOPYRROLATE 0.2 MG/ML IJ SOLN
INTRAMUSCULAR | Status: DC | PRN
  Administered 2015-03-19: .2 mg via INTRAVENOUS

## 2015-03-19 MED ORDER — PHENOL 1.4 % MT LIQD: 2.0000 | Freq: Three times a day (TID) | OROMUCOSAL | Status: DC | PRN

## 2015-03-19 MED ORDER — FENTANYL CITRATE (PF) 100 MCG/2ML IJ SOLN
25.0000 ug | INTRAMUSCULAR | Status: DC | PRN
  Administered 2015-03-19 (×2): 25 ug via INTRAVENOUS
  Administered 2015-03-19: 50 ug via INTRAVENOUS

## 2015-03-19 MED ORDER — EPINEPHRINE HCL (NASAL) 0.1 % NA SOLN
NASAL | Status: AC
  Filled 2015-03-19: qty 30

## 2015-03-19 MED ORDER — LITHIUM CARBONATE ER 450 MG PO TBCR
900.0000 mg | EXTENDED_RELEASE_TABLET | Freq: Every day | ORAL | Status: DC
  Administered 2015-03-19: 900 mg via ORAL
  Filled 2015-03-19 (×2): qty 2

## 2015-03-19 MED ORDER — DIPHENHYDRAMINE HCL 50 MG/ML IJ SOLN: 25.0000 mg | Freq: Four times a day (QID) | INTRAMUSCULAR | Status: DC | PRN

## 2015-03-19 MED ORDER — EPINEPHRINE HCL 1 MG/ML IJ SOLN
INTRAMUSCULAR | Status: DC | PRN
  Administered 2015-03-19: 1 mg

## 2015-03-19 MED ORDER — MIDAZOLAM HCL 2 MG/2ML IJ SOLN
INTRAMUSCULAR | Status: AC
  Filled 2015-03-19: qty 4

## 2015-03-19 MED ORDER — FENTANYL CITRATE (PF) 100 MCG/2ML IJ SOLN
INTRAMUSCULAR | Status: AC
  Filled 2015-03-19: qty 2

## 2015-03-19 MED ORDER — GUAIFENESIN-DM 100-10 MG/5ML PO SYRP: 10.0000 mL | ORAL_SOLUTION | ORAL | Status: DC | PRN

## 2015-03-19 MED ORDER — ONDANSETRON 4 MG PO TBDP: 4.0000 mg | ORAL_TABLET | Freq: Three times a day (TID) | ORAL | Status: DC | PRN

## 2015-03-19 MED ORDER — VITAMIN D 1000 UNITS PO TABS
5000.0000 [IU] | ORAL_TABLET | Freq: Every day | ORAL | Status: DC
  Administered 2015-03-19: 5000 [IU] via ORAL
  Filled 2015-03-19: qty 5

## 2015-03-19 MED ORDER — OXYCODONE HCL 5 MG/5ML PO SOLN: 5.0000 mg | Freq: Once | ORAL | Status: DC | PRN

## 2015-03-19 MED ORDER — LAMOTRIGINE 100 MG PO TABS
100.0000 mg | ORAL_TABLET | Freq: Every day | ORAL | Status: DC
  Administered 2015-03-19: 100 mg via ORAL
  Filled 2015-03-19 (×2): qty 1

## 2015-03-19 MED ORDER — HYDROCORTISONE 2.5 % RE CREA
TOPICAL_CREAM | Freq: Three times a day (TID) | RECTAL | Status: DC | PRN
  Filled 2015-03-19: qty 28.35

## 2015-03-19 MED ORDER — SODIUM CHLORIDE 0.9 % IV SOLN
INTRAVENOUS | Status: DC
  Administered 2015-03-19: 10:00:00 via INTRAVENOUS

## 2015-03-19 MED ORDER — HYDROCORTISONE 1 % EX CREA
1.0000 "application " | TOPICAL_CREAM | Freq: Three times a day (TID) | CUTANEOUS | Status: DC | PRN
  Filled 2015-03-19: qty 28

## 2015-03-19 MED ORDER — ATORVASTATIN CALCIUM 40 MG PO TABS
40.0000 mg | ORAL_TABLET | Freq: Every day | ORAL | Status: DC
  Administered 2015-03-19: 40 mg via ORAL
  Filled 2015-03-19: qty 1

## 2015-03-19 MED ORDER — ACETAMINOPHEN 325 MG PO TABS
325.0000 mg | ORAL_TABLET | ORAL | Status: DC | PRN
Start: 2015-03-19 — End: 2015-03-19

## 2015-03-19 MED ORDER — PANTOPRAZOLE SODIUM 40 MG PO TBEC
40.0000 mg | DELAYED_RELEASE_TABLET | Freq: Every day | ORAL | Status: DC
  Administered 2015-03-19: 40 mg via ORAL
  Filled 2015-03-19: qty 1

## 2015-03-19 MED ORDER — VITAMIN D3 125 MCG (5000 UT) PO CAPS: 1.0000 | ORAL_CAPSULE | Freq: Every day | ORAL | Status: DC

## 2015-03-19 MED ORDER — DEXAMETHASONE SODIUM PHOSPHATE 10 MG/ML IJ SOLN
INTRAMUSCULAR | Status: DC | PRN
  Administered 2015-03-19: 10 mg via INTRAVENOUS

## 2015-03-19 MED ORDER — PROPOFOL 500 MG/50ML IV EMUL
INTRAVENOUS | Status: DC | PRN
  Administered 2015-03-19 (×2): 100 ug/kg/min via INTRAVENOUS

## 2015-03-19 MED ORDER — ONDANSETRON HCL 4 MG/2ML IJ SOLN: 4.0000 mg | Freq: Four times a day (QID) | INTRAMUSCULAR | Status: DC | PRN

## 2015-03-19 MED ORDER — KCL IN DEXTROSE-NACL 20-5-0.45 MEQ/L-%-% IV SOLN
INTRAVENOUS | Status: AC
  Filled 2015-03-19: qty 1000

## 2015-03-19 MED ORDER — DEXAMETHASONE SODIUM PHOSPHATE 10 MG/ML IJ SOLN
INTRAMUSCULAR | Status: AC
  Filled 2015-03-19: qty 1

## 2015-03-19 MED ORDER — LIDOCAINE HCL (CARDIAC) 20 MG/ML IV SOLN
INTRAVENOUS | Status: DC | PRN
  Administered 2015-03-19: 60 mg via INTRAVENOUS

## 2015-03-19 MED ORDER — LITHIUM CARBONATE 300 MG PO TABS: 900.0000 mg | ORAL_TABLET | Freq: Every day | ORAL | Status: DC

## 2015-03-19 MED ORDER — SODIUM CHLORIDE 0.9 % IV SOLN
0.0125 ug/kg/min | INTRAVENOUS | Status: DC
  Administered 2015-03-19 (×2): .5 ug/kg/min via INTRAVENOUS
  Filled 2015-03-19: qty 1000

## 2015-03-19 MED ORDER — 0.9 % SODIUM CHLORIDE (POUR BTL) OPTIME
TOPICAL | Status: DC | PRN
  Administered 2015-03-19: 1000 mL

## 2015-03-19 MED ORDER — SUCCINYLCHOLINE CHLORIDE 20 MG/ML IJ SOLN
INTRAMUSCULAR | Status: AC
  Filled 2015-03-19: qty 1

## 2015-03-19 MED ORDER — LIDOCAINE HCL (CARDIAC) 20 MG/ML IV SOLN
INTRAVENOUS | Status: AC
  Filled 2015-03-19: qty 5

## 2015-03-19 MED ORDER — ONDANSETRON HCL 4 MG/2ML IJ SOLN
INTRAMUSCULAR | Status: AC
  Filled 2015-03-19: qty 2

## 2015-03-19 MED ORDER — ALPRAZOLAM 0.5 MG PO TABS
0.5000 mg | ORAL_TABLET | Freq: Every evening | ORAL | Status: DC
  Administered 2015-03-19: 0.5 mg via ORAL
  Filled 2015-03-19: qty 1

## 2015-03-19 MED ORDER — PRAMOXINE-ZINC OXIDE IN MO 1-12.5 % RE OINT
1.0000 "application " | TOPICAL_OINTMENT | Freq: Three times a day (TID) | RECTAL | Status: DC | PRN
  Filled 2015-03-19: qty 28.3

## 2015-03-19 MED ORDER — HYDROCODONE-ACETAMINOPHEN 5-325 MG PO TABS
1.0000 | ORAL_TABLET | ORAL | Status: DC | PRN
  Administered 2015-03-20: 2 via ORAL
  Filled 2015-03-19: qty 2

## 2015-03-19 MED ORDER — ACETAMINOPHEN 325 MG PO TABS: 325.0000 mg | ORAL_TABLET | Freq: Four times a day (QID) | ORAL | Status: DC | PRN

## 2015-03-19 MED ORDER — SODIUM CHLORIDE 0.9 % IV SOLN
INTRAVENOUS | Status: DC | PRN
  Administered 2015-03-19 (×2): via INTRAVENOUS

## 2015-03-19 MED ORDER — PROPOFOL 10 MG/ML IV BOLUS
INTRAVENOUS | Status: AC
  Filled 2015-03-19: qty 20

## 2015-03-19 MED ORDER — ACETAMINOPHEN 160 MG/5ML PO SOLN
325.0000 mg | ORAL | Status: DC | PRN
Start: 2015-03-19 — End: 2015-03-19
  Filled 2015-03-19: qty 20.3

## 2015-03-19 MED ORDER — ALUM & MAG HYDROXIDE-SIMETH 200-200-20 MG/5ML PO SUSP: 30.0000 mL | Freq: Four times a day (QID) | ORAL | Status: DC | PRN

## 2015-03-19 MED ORDER — DOCUSATE SODIUM 100 MG PO CAPS: 100.0000 mg | ORAL_CAPSULE | Freq: Two times a day (BID) | ORAL | Status: DC | PRN

## 2015-03-19 SURGICAL SUPPLY — 35 items
BALLN PULM 12 13.5 15X75 (BALLOONS)
BALLN PULM 12 13.5 15X75CM (BALLOONS)
BALLN PULMONARY 10 12MM (MISCELLANEOUS)
BALLN PULMONARY 10-12 (MISCELLANEOUS) IMPLANT
BALLOON PULM 12 13.5 15X75 (BALLOONS) IMPLANT
BANDAGE EYE OVAL (MISCELLANEOUS) IMPLANT
BLADE SURG 15 STRL LF DISP TIS (BLADE) IMPLANT
BLADE SURG 15 STRL SS (BLADE)
CANISTER SUCTION 2500CC (MISCELLANEOUS) ×3 IMPLANT
CONT SPEC 4OZ CLIKSEAL STRL BL (MISCELLANEOUS) IMPLANT
COVER TABLE BACK 60X90 (DRAPES) ×3 IMPLANT
CRADLE DONUT ADULT HEAD (MISCELLANEOUS) IMPLANT
DRAPE PROXIMA HALF (DRAPES) ×3 IMPLANT
GAUZE SPONGE 4X4 12PLY STRL (GAUZE/BANDAGES/DRESSINGS) ×3 IMPLANT
GLOVE BIO SURGEON STRL SZ7.5 (GLOVE) ×3 IMPLANT
GLOVE BIOGEL PI IND STRL 8.5 (GLOVE) IMPLANT
GLOVE BIOGEL PI INDICATOR 8.5 (GLOVE) ×2
GLOVE SURG SS PI 8.5 STRL IVOR (GLOVE) ×2
GLOVE SURG SS PI 8.5 STRL STRW (GLOVE) IMPLANT
GOWN STRL REUS W/ TWL LRG LVL3 (GOWN DISPOSABLE) IMPLANT
GOWN STRL REUS W/TWL LRG LVL3 (GOWN DISPOSABLE)
KIT BASIN OR (CUSTOM PROCEDURE TRAY) ×3 IMPLANT
KIT ROOM TURNOVER OR (KITS) ×3 IMPLANT
NDL HYPO 25GX1X1/2 BEV (NEEDLE) IMPLANT
NEEDLE HYPO 25GX1X1/2 BEV (NEEDLE) IMPLANT
NS IRRIG 1000ML POUR BTL (IV SOLUTION) ×3 IMPLANT
PAD ARMBOARD 7.5X6 YLW CONV (MISCELLANEOUS) ×6 IMPLANT
PATTIES SURGICAL .5 X3 (DISPOSABLE) ×3 IMPLANT
SOLUTION ANTI FOG 6CC (MISCELLANEOUS) IMPLANT
SURGILUBE 2OZ TUBE FLIPTOP (MISCELLANEOUS) IMPLANT
SUT SILK 2 0 FS (SUTURE) IMPLANT
TOWEL OR 17X24 6PK STRL BLUE (TOWEL DISPOSABLE) ×6 IMPLANT
TUBE CONNECTING 12'X1/4 (SUCTIONS) ×1
TUBE CONNECTING 12X1/4 (SUCTIONS) ×2 IMPLANT
WATER STERILE IRR 1000ML POUR (IV SOLUTION) ×3 IMPLANT

## 2015-03-19 NOTE — Progress Notes (Signed)
Subjective: POD#0 from Dl with excision vocal fold granulomas. Doing well, taking PO, normal voice  Objective: Vital signs in last 24 hours: Temp:  [98.6 F (37 C)-98.8 F (37.1 C)] 98.7 F (37.1 C) (09/23 1255) Pulse Rate:  [69-109] 72 (09/23 1255) Resp:  [14-18] 14 (09/23 1255) BP: (93-140)/(70-85) 140/84 mmHg (09/23 1255) SpO2:  [100 %] 100 % (09/23 1255) Weight:  [53.343 kg (117 lb 9.6 oz)] 53.343 kg (117 lb 9.6 oz) (09/23 0825)  Normal voice with no stridor, neck supple, trachea midline, Cn 2-12 intact and symmetric  (wbc:2,hgb:2,hct:2,plt:2)  Recent Labs  03/17/15 0957  NA 141  K 4.0  CL 107  CO2 29  GLUCOSE 100*  BUN 6  CREATININE 0.81  CALCIUM 10.2    Medications:  Scheduled Meds: . ALPRAZolam  0.5 mg Oral QPM  . atorvastatin  40 mg Oral Daily  . cholecalciferol  5,000 Units Oral Daily  . dextrose 5 % and 0.45 % NaCl with KCl 20 mEq/L      . fentaNYL      . lamoTRIgine  100 mg Oral QHS  . lithium carbonate  900 mg Oral QHS  . pantoprazole  40 mg Oral Daily   Continuous Infusions: . dextrose 5 % and 0.45 % NaCl with KCl 20 mEq/L 50 mL/hr at 03/19/15 1304   PRN Meds:.acetaminophen, diphenhydrAMINE, docusate sodium, HYDROcodone-acetaminophen, morphine injection, ondansetron (ZOFRAN) IV, ondansetron, phenol  Assessment/Plan: POD#0 from Dl with excision BL vocal fold lesions. Advance diet, pain control, likely D/C in the morning.     Melvenia Beam 03/19/2015, 5:46 PM

## 2015-03-19 NOTE — Anesthesia Postprocedure Evaluation (Signed)
  Anesthesia Post-op Note  Patient: Laurie Booker  Procedure(s) Performed: Procedure(s) with comments: MICROLARYNGOSCOPY WITH CO2 LASER AND EXCISION OF VOCAL CORD LESION (N/A) - Micro Direct Laryngoscopy with co2 laser excision of bilateral vocal cord granulomas/jet venturi ventilation  Patient Location: PACU  Anesthesia Type:General  Level of Consciousness: awake  Airway and Oxygen Therapy: Patient Spontanous Breathing  Post-op Pain: none  Post-op Assessment: Post-op Vital signs reviewed, Patient's Cardiovascular Status Stable, Respiratory Function Stable, Patent Airway, No signs of Nausea or vomiting and Pain level controlled              Post-op Vital Signs: Reviewed and stable  Last Vitals:  Filed Vitals:   03/19/15 1255  BP: 140/84  Pulse: 72  Temp: 37.1 C  Resp: 14    Complications: No apparent anesthesia complications

## 2015-03-19 NOTE — H&P (Signed)
Laurie Booker is an 49 y.o. female.   Chief Complaint: vocal fold granulomas HPI: 49 year old female who fell getting out her car a little over a month ago and sustained a depressed skull fracture and orbitozygomatic fracture.  She had subdural and epidural hematomas at the time.  She required mechanical ventilation for five days with endotracheal intubation.  Her throat felt fine for about one week afterwards but then she started feeling throat pain and has a sense of difficulty breathing but no true breathing difficulty.  In the office, fiberoptic exam revealed bilateral sizeable vocal fold granulomas and she presents for surgical management.  Past Medical History  Diagnosis Date  . Osteoporosis   . Blood clots in brain     d/t traumatic brain injury Aug 2016  . Traumatic brain injury     with loss of consciousness, intubated, at [facility] August 2016, closed, hemorrhage  . Anxiety   . Depression     behavioral health admission 2009 or 2010  . H/O suicide attempt 2009  . Bipolar disorder   . H/O transfusion of packed red blood cells   . Chronic kidney disease     follows up with Dr. Lequita Halt: nephrologist in [place]. Pt cannot state what is wrong with her kidneys; just that she has issues with them. (numerous renal cysts on 01/2015 CT)    Past Surgical History  Procedure Laterality Date  . Cesarean section    . Rhinoplasty    . Septoplasty    . Dilation and curettage of uterus    . Colonoscopy w/ polypectomy    . Vocal cord surgery      granulomas removed after intubation d/t suicide attempt    History reviewed. No pertinent family history. Social History:  reports that she has never smoked. She does not have any smokeless tobacco history on file. She reports that she drinks alcohol. Her drug history is not on file.  Allergies: No Known Allergies  Medications Prior to Admission  Medication Sig Dispense Refill  . acetaminophen (TYLENOL) 325 MG tablet Take 1-2 tablets (325-650 mg total) by  mouth every 6 (six) hours as needed for fever, headache, mild pain or moderate pain.    Marland Kitchen ALPRAZolam (XANAX) 0.5 MG tablet Take 0.5 mg by mouth every evening.     Marland Kitchen atorvastatin (LIPITOR) 40 MG tablet Take 40 mg by mouth daily.    . Cholecalciferol (VITAMIN D3) 5000 UNITS CAPS Take 1 capsule by mouth daily.    Marland Kitchen lamoTRIgine (LAMICTAL) 100 MG tablet Take 100 mg by mouth at bedtime.    Marland Kitchen lithium 300 MG tablet Take 900 mg by mouth at bedtime.     Marland Kitchen omeprazole (PRILOSEC) 40 MG capsule Take 40 mg by mouth 2 (two) times daily before a meal.      No results found for this or any previous visit (from the past 48 hour(s)). No results found.  Review of Systems  All other systems reviewed and are negative.   Blood pressure 134/85, pulse 69, temperature 98.8 F (37.1 C), temperature source Oral, resp. rate 18, height  (1.575 m), weight 53.343 kg (117 lb 9.6 oz), SpO2 100 %. Physical Exam  Constitutional: She is oriented to person, place, and time. She appears well-developed and well-nourished. No distress.  HENT:  Head: Normocephalic and atraumatic.  Right Ear: External ear normal.  Left Ear: External ear normal.  Nose: Nose normal.  Mouth/Throat: Oropharynx is clear and moist.  Very mild hoarseness.  Eyes:  Conjunctivae and EOM are normal. Pupils are equal, round, and reactive to light.  Neck: Normal range of motion. Neck supple.  Cardiovascular: Normal rate.   Respiratory: Effort normal.  Musculoskeletal: Normal range of motion.  Neurological: She is alert and oriented to person, place, and time. No cranial nerve deficit.  Skin: Skin is warm and dry.  Psychiatric: She has a normal mood and affect. Her behavior is normal. Judgment and thought content normal.     Assessment/Plan Bilateral vocal fold granulomas, throat pain To OR for SMDL with CO2 laser excision of bilateral vocal fold granulomas.  Will observe overnight due to recent head injury and undergoing anesthesia.  Laurie Booker,  Laurie Booker 03/19/2015, 10:46 AM

## 2015-03-19 NOTE — Op Note (Signed)
Laurie Booker, Laurie Booker NO.:  [id number]  MEDICAL RECORD NO.:  [id number]  LOCATION:  [facility]                         FACILITY:  [facility]  PHYSICIAN:  Antony Contras, MD     DATE OF BIRTH:  07-02-1965  DATE OF PROCEDURE:  03/19/2015 DATE OF DISCHARGE:                              OPERATIVE REPORT   PREOPERATIVE DIAGNOSIS:  Bilateral vocal fold granulomas.  POSTOPERATIVE DIAGNOSIS:  Bilateral vocal fold granulomas.  PROCEDURE:  Suspended microdirect laryngoscopy with CO2 laser excision of bilateral vocal fold granulomas.  SURGEON:  Antony Contras, M.D.  ANESTHESIA:  Jet Venturi ventilation.  COMPLICATIONS:  None.  INDICATION:  The patient is a 49 year old female who required 5 days of orotracheal intubation on mechanical ventilation following a fall in which she sustained a skull fracture and subdural and epidural hematomas.  About 1 week after extubation, she started noticing soreness in her lower throat and has had a sense of the little bit of trouble breathing, although never any serious trouble.  In the office, by fiberoptic exam, she was found to have bilateral vocal fold granulomas obstructing her glottic airway partially.  She presents to the operating room for surgical management.  FINDINGS:  There was a granuloma pedicled at each vocal process, slightly larger on the right side.  DESCRIPTION OF PROCEDURE:  The patient was identified in the holding room and informed consent having been obtained after discussion of risks, benefits, alternatives, the patient was brought to the operative suite, and put on the operative table in supine position.  Anesthesia was induced.  The patient maintained via mask ventilation.  The eyes were taped closed and bed was turned 90 degrees from anesthesia.  A tooth guard was placed over the upper teeth and the Storz laryngoscope was then inserted in the mouth and placed into the supraglottic position where it was suspended  to the Mayo stand using a Lewy arm.  Jet Venturi ventilation was then initiated.  The eyes were then covered with damp eye pads, and face covered with a damp towel.  A 0-degree telescope was used to make a preoperative photograph.  The operating microscope was then brought into view and used to evaluate the larynx.  An epinephrine- soaked pledget was placed against the left vocal process granuloma for a minute or so and then removed.  The granuloma was then removed using the CO2 laser on a setting of 8 watts making an incision in line with the contour, the normal vocal fold in this area.  An epinephrine pledget was then rubbed against the site and then held against the right side lesion.  The right-sided lesion was then removed in the same fashion with the CO2 laser and making an incision along the natural contour of the vocal fold.  Lesions were passed to nursing for pathology.  An epinephrine pledget was again rubbed against the side.  A 0-degree telescope was used to make a postoperative photograph.  The larynx was then sprayed with topical lidocaine and the laryngoscope was then taken out of suspension and removed from the patient's mouth while suctioning the airway.  She was then returned to mask ventilation after removing the  tooth guard.  She was returned to Anesthesia for wake-up, was moved to recovery room in stable condition.     Antony Contras, MD     DDB/MEDQ  D:  03/19/2015  T:  03/19/2015  Job:  [id number]

## 2015-03-19 NOTE — Brief Op Note (Signed)
03/19/2015  11:40 AM  PATIENT:  Laurie Booker  49 y.o. female  PRE-OPERATIVE DIAGNOSIS:  vocal cord granulomas  POST-OPERATIVE DIAGNOSIS:  vocal cord granulomas  PROCEDURE:  Procedure(s) with comments: MICROLARYNGOSCOPY WITH CO2 LASER AND EXCISION OF VOCAL CORD LESION (N/A) - Micro Direct Laryngoscopy with co2 laser excision of bilateral vocal cord granulomas/jet venturi ventilation  SURGEON:  Surgeon(s) and Role:    * Christia Reading, MD - Primary  PHYSICIAN ASSISTANT:   ASSISTANTS: none   ANESTHESIA:   general  EBL:     BLOOD ADMINISTERED:none  DRAINS: none   LOCAL MEDICATIONS USED:  NONE  SPECIMEN:  Source of Specimen:  Bilateral vocal fold granulomas  DISPOSITION OF SPECIMEN:  PATHOLOGY  COUNTS:  YES  TOURNIQUET:  * No tourniquets in log *  DICTATION: .Other Dictation: Dictation Number [id number]  PLAN OF CARE: Admit for overnight observation  PATIENT DISPOSITION:  PACU - hemodynamically stable.   Delay start of Pharmacological VTE agent (>24hrs) due to surgical blood loss or risk of bleeding: no

## 2015-03-19 NOTE — Discharge Summary (Signed)
03/20/15  7:09 AM  Date of Admission:03/19/2015   Date of Discharge: 03/20/15  Discharge MD: Melvenia Beam  Admitting MD: Christia Reading, MD  Reason for admission/final discharge diagnosis: vocal fold granulomas  Labs: see EPIC  Procedure(s) performed: direct laryngoscopy with biopsy/excision 03/19/15  Discharge Condition: good  Discharge Exam: strong voice, NAD, breathing well  Discharge Instructions: follow up with  Dr. Jenne Pane at [facility] in 1 week  Hospital Course: did well after direct laryngoscopy, taking PO and discharged POD#1.  Melvenia Beam 03/20/15

## 2015-03-19 NOTE — Discharge Instructions (Signed)
No talking for two days, then use gentle voice only.

## 2015-03-19 NOTE — Anesthesia Procedure Notes (Signed)
Date/Time: 03/19/2015 11:23 AM Performed by: Coralee Rud Pre-anesthesia Checklist: Patient identified, Emergency Drugs available, Suction available and Patient being monitored Patient Re-evaluated:Patient Re-evaluated prior to inductionOxygen Delivery Method: Circle system utilized Intubation Type: IV induction Ventilation: Mask ventilation without difficulty Tube type: Jet ventilation.

## 2015-03-19 NOTE — Anesthesia Preprocedure Evaluation (Signed)
Anesthesia Evaluation  Patient identified by MRN, date of birth, ID band Patient awake    Reviewed: Allergy & Precautions, NPO status , Patient's Chart, lab work & pertinent test results  History of Anesthesia Complications Negative for: history of anesthetic complications  Airway Mallampati: II  TM Distance: >3 FB Neck ROM: Full    Dental  (+) Teeth Intact   Pulmonary neg pulmonary ROS,    breath sounds clear to auscultation       Cardiovascular (-) hypertension(-) angina+ Peripheral Vascular Disease  (-) Past MI and (-) CHF  Rhythm:Regular     Neuro/Psych PSYCHIATRIC DISORDERS Anxiety Depression Bipolar Disorder Traumatic brain injury 01/2015 with epidural hematoma,    GI/Hepatic negative GI ROS, Neg liver ROS,   Endo/Other  negative endocrine ROS  Renal/GU negative Renal ROS     Musculoskeletal   Abdominal   Peds  Hematology negative hematology ROS (+)   Anesthesia Other Findings   Reproductive/Obstetrics                             Anesthesia Physical Anesthesia Plan  ASA: II  Anesthesia Plan: General   Post-op Pain Management:    Induction: Intravenous  Airway Management Planned:   Additional Equipment: None  Intra-op Plan:   Post-operative Plan:   Informed Consent: I have reviewed the patients History and Physical, chart, labs and discussed the procedure including the risks, benefits and alternatives for the proposed anesthesia with the patient or authorized representative who has indicated his/her understanding and acceptance.   Dental advisory given  Plan Discussed with: CRNA and Surgeon  Anesthesia Plan Comments:         Anesthesia Quick Evaluation

## 2015-03-19 NOTE — Transfer of Care (Signed)
Immediate Anesthesia Transfer of Care Note  Patient: Laurie Booker  Procedure(s) Performed: Procedure(s) with comments: MICROLARYNGOSCOPY WITH CO2 LASER AND EXCISION OF VOCAL CORD LESION (N/A) - Micro Direct Laryngoscopy with co2 laser excision of bilateral vocal cord granulomas/jet venturi ventilation  Patient Location: PACU  Anesthesia Type:General  Level of Consciousness: awake, alert , oriented and patient cooperative  Airway & Oxygen Therapy: Patient Spontanous Breathing and Patient connected to nasal cannula oxygen  Post-op Assessment: Report given to RN, Post -op Vital signs reviewed and stable, Patient moving all extremities and Patient moving all extremities X 4  Post vital signs: Reviewed and stable  Last Vitals:  Filed Vitals:   03/19/15 1152  BP: 130/73  Pulse: 109  Temp:   Resp: 15    Complications: No apparent anesthesia complications

## 2015-03-20 DIAGNOSIS — J383 Other diseases of vocal cords: Secondary | ICD-10-CM | POA: Diagnosis not present

## 2015-03-20 MED ORDER — ONDANSETRON 4 MG PO TBDP: 4.0000 mg | ORAL_TABLET | Freq: Three times a day (TID) | ORAL | Status: DC | PRN

## 2015-03-20 MED ORDER — HYDROCODONE-ACETAMINOPHEN 5-325 MG PO TABS: 1.0000 | ORAL_TABLET | ORAL | Status: DC | PRN

## 2015-03-20 NOTE — Discharge Planning (Signed)
AVS and Rx given to pt and spouse, all questions answered understanding verbalized, discharged per wheelchair to private car to home. With all personal belongs

## 2015-03-22 ENCOUNTER — Encounter (HOSPITAL_COMMUNITY): Payer: Self-pay | Admitting: Otolaryngology

## 2015-03-26 ENCOUNTER — Ambulatory Visit: Payer: PRIVATE HEALTH INSURANCE

## 2015-03-26 ENCOUNTER — Encounter: Payer: Self-pay | Admitting: Occupational Therapy

## 2015-03-26 ENCOUNTER — Encounter: Payer: Self-pay | Admitting: Rehabilitation

## 2015-03-26 ENCOUNTER — Ambulatory Visit: Payer: PRIVATE HEALTH INSURANCE | Admitting: Rehabilitation

## 2015-03-26 ENCOUNTER — Ambulatory Visit: Payer: PRIVATE HEALTH INSURANCE | Admitting: Occupational Therapy

## 2015-03-26 DIAGNOSIS — R41841 Cognitive communication deficit: Secondary | ICD-10-CM

## 2015-03-26 DIAGNOSIS — Z7409 Other reduced mobility: Secondary | ICD-10-CM

## 2015-03-26 DIAGNOSIS — R531 Weakness: Secondary | ICD-10-CM

## 2015-03-26 DIAGNOSIS — R4189 Other symptoms and signs involving cognitive functions and awareness: Secondary | ICD-10-CM

## 2015-03-26 DIAGNOSIS — R42 Dizziness and giddiness: Secondary | ICD-10-CM

## 2015-03-26 DIAGNOSIS — S069X1A Unspecified intracranial injury with loss of consciousness of 30 minutes or less, initial encounter: Secondary | ICD-10-CM | POA: Diagnosis not present

## 2015-03-26 DIAGNOSIS — R2681 Unsteadiness on feet: Secondary | ICD-10-CM

## 2015-03-26 NOTE — Therapy (Signed)
[facility] [address] [place], [place], [address] Phone: [phone]   Fax:  [phone]  Speech Language Pathology Treatment  Patient Details  Name: Laurie Booker MRN: [id number] Date of Birth: 1965/07/08 Referring Provider:  Shirlean Kelly, MD  Encounter Date: 03/26/2015      End of Session - 03/26/15 1540    Visit Number 3   Number of Visits 16   Date for SLP Re-Evaluation 05/26/15   SLP Start Time 1448   SLP Stop Time  1531   SLP Time Calculation (min) 43 min   Activity Tolerance Patient tolerated treatment well      Past Medical History  Diagnosis Date  . Osteoporosis   . Blood clots in brain     d/t traumatic brain injury Aug 2016  . Traumatic brain injury     with loss of consciousness, intubated, at [facility] August 2016, closed, hemorrhage  . Anxiety   . Depression     behavioral health admission 2009 or 2010  . H/O suicide attempt 2009  . Bipolar disorder   . H/O transfusion of packed red blood cells   . Chronic kidney disease     follows up with Dr. Lequita Halt: nephrologist in [place]. Pt cannot state what is wrong with her kidneys; just that she has issues with them. (numerous renal cysts on 01/2015 CT)    Past Surgical History  Procedure Laterality Date  . Cesarean section    . Rhinoplasty    . Septoplasty    . Dilation and curettage of uterus    . Colonoscopy w/ polypectomy    . Vocal cord surgery      granulomas removed after intubation d/t suicide attempt  . Microlaryngoscopy with co2 laser and excision of vocal cord lesion N/A 03/19/2015    Procedure: MICROLARYNGOSCOPY WITH CO2 LASER AND EXCISION OF VOCAL CORD LESION;  Surgeon: Christia Reading, MD;  Location: [facility] OR;  Service: ENT;  Laterality: N/A;  Micro Direct Laryngoscopy with co2 laser excision of bilateral vocal cord granulomas/jet venturi ventilation    There were no vitals filed for this visit.  Visit Diagnosis: Cognitive communication  deficit      Subjective Assessment - 03/26/15 1502    Subjective "I've been crying a lot today, for no real reason."               ADULT SLP TREATMENT - 03/26/15 1503    General Information   Behavior/Cognition Alert;Cooperative;Pleasant mood   Treatment Provided   Treatment provided Cognitive-Linquistic   Pain Assessment   Pain Assessment 0-10   Pain Score 5    Pain Location headache   Pain Descriptors / Indicators Headache   Pain Intervention(s) Monitored during session   Cognitive-Linquistic Treatment   Treatment focused on Cognition   Skilled Treatment SLP facilitated pt's higher level cognitive linguistic skills by working with pt on mod complex sequencing task. Pt began task with pen instead of pencil, "Did I already screw up?," pt asked. Pt did not use compensatory strategy when experiencing difficulty, until SLP suggested one for last 3-4 steps. Mod A needed from SLP to complete task. Halfway through task, pt appeared visibly upset by deflecting her difficulty with the task by humor.     Assessment / Recommendations / Plan   Plan Continue with current plan of care   Progression Toward Goals   Progression toward goals Progressing toward goals            SLP Short Term  Goals - 03/26/15 1542    SLP SHORT TERM GOAL #1   Title pt will demo alternating attention during mod complex cognitive-linguistic tasks in ST room with modified independence (pt checking for errors)   Baseline WNL in simple tasks    Time 3   Period Weeks   Status On-going   SLP SHORT TERM GOAL #2   Title pt will tell SLP 6 memory strategies/compensations   Baseline none provided yet   Time 3   Period Weeks   Status On-going   SLP SHORT TERM GOAL #3   Title pt will demo emergent awareness and selective attention adequate to quell verbosity during therapy tasks with rare nonverbal cues   Time 3   Period Weeks   Status New          SLP Long Term Goals - 03/26/15 1542    SLP LONG TERM  GOAL #1   Title pt will utilize memory strategies during 4 therapy sessions   Baseline none demonstrated   Time 7   Period Weeks   Status On-going   SLP LONG TERM GOAL #2   Title pt will demo alternating attention in a mod distracting environment with modified independence (pt will self-correct/double check)   Baseline WNL alternating attention for simple tasks in quiet environment   Time 7   Period Weeks   Status On-going   SLP LONG TERM GOAL #3   Title pt will demo emergent awareness, and attention appropriate for witholding extraneous conversation during therapy tasks, independently   Time 7   Period Weeks   Status New          Plan - 03/26/15 1540    Clinical Impression Statement Decr'd emergent awareness persisted today as pt unaware of errors until SLP pointed them out. Pt deflected her difficulties by using humor. Skilled ST needed to cont to improve cognitive-linguistic skills for  incr'd independence.   Speech Therapy Frequency 2x / week   Duration --  7 weeks   Treatment/Interventions Internal/external aids;Compensatory strategies;Cognitive reorganization;SLP instruction and feedback;Patient/family education;Functional tasks;Cueing hierarchy   Potential to Achieve Goals Good        Problem List Patient Active Problem List   Diagnosis Date Noted  . 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 SAH (subarachnoid hemorrhage) 01/31/2015    [facility] , MS, CCC-SLP  03/26/2015, 3:43 PM  [facility] [address] [place], [place], [address] Phone: [phone]   Fax:  [phone]

## 2015-03-26 NOTE — Patient Instructions (Signed)
Provided pt with handout on compensatory strategy with visual targeting.

## 2015-03-26 NOTE — Therapy (Signed)
[facility] [address] [place], [place], [address] Phone: [phone]   Fax:  [phone]  Physical Therapy Treatment  Patient Details  Name: Laurie Booker MRN: [id number] Date of Birth: [birth year] Referring Provider:  Shirlean Kelly, MD  Encounter Date: 03/26/2015      PT End of Session - 03/26/15 1649    Visit Number 2   Number of Visits 9   Date for PT Re-Evaluation 05/11/15   Authorization Type [employer]    PT Start Time 1403   PT Stop Time 1448   PT Time Calculation (min) 45 min   Activity Tolerance Treatment limited secondary to agitation;Patient limited by fatigue   Behavior During Therapy Agitated      Past Medical History  Diagnosis Date  . Osteoporosis   . Blood clots in brain     d/t traumatic brain injury Aug 2016  . Traumatic brain injury     with loss of consciousness, intubated, at [facility] August 2016, closed, hemorrhage  . Anxiety   . Depression     behavioral health admission 2009 or 2010  . H/O suicide attempt 2009  . Bipolar disorder   . H/O transfusion of packed red blood cells   . Chronic kidney disease     follows up with Dr. Lequita Halt: nephrologist in [place]. Pt cannot state what is wrong with her kidneys; just that she has issues with them. (numerous renal cysts on 01/2015 CT)    Past Surgical History  Procedure Laterality Date  . Cesarean section    . Rhinoplasty    . Septoplasty    . Dilation and curettage of uterus    . Colonoscopy w/ polypectomy    . Vocal cord surgery      granulomas removed after intubation d/t suicide attempt  . Microlaryngoscopy with co2 laser and excision of vocal cord lesion N/A 03/19/2015    Procedure: MICROLARYNGOSCOPY WITH CO2 LASER AND EXCISION OF VOCAL CORD LESION;  Surgeon: Christia Reading, MD;  Location: [facility] OR;  Service: ENT;  Laterality: N/A;  Micro Direct Laryngoscopy with co2 laser excision of bilateral vocal cord granulomas/jet venturi ventilation     There were no vitals filed for this visit.  Visit Diagnosis:  Weakness generalized  Unsteadiness  Dizziness and giddiness      Subjective Assessment - 03/26/15 1408    Subjective Pt reports not having a good day and has been more "emotional for no reason" and is very "jittery."  Continues to be anxious to see Dr. Riley Kill regarding FMLA and returning to work.    Limitations House hold activities;Walking   Patient Stated Goals To improve balance, equilibrium and return to work.    Currently in Pain? Yes   Pain Score 4    Pain Location Head   Pain Orientation --  whole head, mostly posterior   Pain Descriptors / Indicators Aching   Pain Type Acute pain   Pain Onset More than a month ago   Pain Frequency Intermittent   Aggravating Factors  noise, light, stress   Pain Relieving Factors meds and rest                Vestibular Assessment - 03/26/15 0001    Symptom Behavior   Type of Dizziness Unsteady with head/body turns   Frequency of Dizziness with any head movements   Aggravating Factors Looking up to the ceiling;Turning body quickly;Turning head quickly;Turning head sideways;Walking in a crowd;Supine to sit   Relieving Factors Head stationary;Dark  room;Closing eyes;Rest;Avoiding busy/distracting areas   Occulomotor Exam   Occulomotor Alignment Normal   Spontaneous Absent   Gaze-induced Comment  L eye only saccadic movement with downward gaze   Smooth Pursuits Saccades  in L eye only with downward gaze   Saccades Poor trajectory   Comment L eye with poor trajectory inferiorly to superiorly                 [facility] Adult PT Treatment/Exercise - 03/26/15 1405    Ambulation/Gait   Ambulation/Gait Yes   Ambulation/Gait Assistance 5: Supervision   Ambulation/Gait Assistance Details Had pt ambulate single lap around therapy gym with [facility] with cues for upright posture and looking ahead instead of down to feet.  Transitioned to next lap without use of SPC at S  level, again min cues for posture.     Ambulation Distance (Feet) 230 Feet   Assistive device Straight cane;None   Gait Pattern Step-through pattern;Decreased arm swing - right;Decreased arm swing - left;Decreased stride length;Decreased weight shift to right;Decreased weight shift to left;Trunk flexed   Ambulation Surface Level;Indoor   Neuro Re-ed    Neuro Re-ed Details  While in kitchen performed simple prep task of reaching for pan, filling with water, taking to stove, taking back to sink and then placing pot in dishwasher.  Pt provided cues for eye movement to target going to, followed by head movement then moving body to allow decreased dizziness.  Pt able to complete wth 3-4/10 dizziness.     Exercises   Exercises Other Exercises   Other Exercises  Performed compensatory targeting task, see vestibular tx section for details.          Vestibular Treatment/Exercise - 03/26/15 0001    Vestibular Treatment/Exercise   Vestibular Treatment Provided Gaze   Gaze Exercises Eye/Head Exercise Horizontal;Comment   Eye/Head Exercise Horizontal   Foot Position feet apart   Reps 10   Comments Provided pt with compensatory eye/head/body transitions in corner with chair in front for support.  Utilized "A" target with cues for breaking down movements as she continued to want to move eyes and head together.  Pt with poor frustration tolerance and increased dizziness during activity.  Educated pt on performing 5 reps at a time and coming to midline each rep to decrease dizziness.  Also educated on importance of getting baseline dizziness and only increasing 2 points as to not over stress vestibular system and impede progress.                 PT Education - 03/26/15 1648    Education provided Yes   Education Details Continue to educate on brain injury recovery, symptoms that are normal with brain injury, vestibular deficits and compensatory vs adaptation.     Person(s) Educated Patient    Methods Explanation   Comprehension Verbalized understanding;Need further instruction          PT Short Term Goals - 03/12/15 1507    PT SHORT TERM GOAL #1   Title Pt will perform in order to assess functional endurance.  (Target Date: 04/09/15)   Baseline Not performed due to time contraint   Time 4   Period Weeks   PT SHORT TERM GOAL #2   Title Pt will improve gait speed to 2.78 ft/sec to indicate decreased fall risk and improved efficiency of gait.  (Target Date: 04/09/15)   Baseline 2.18 on 03/12/15   Time 4   Period Weeks   PT SHORT TERM GOAL #3  Title Pt will increase cognitive TUG score to <15 secs in order to indicate functional improvement in balance with dual tasking.   (Target Date: 04/09/15)   Baseline 15.28 secs on 03/12/15   Time 4   Period Weeks   PT SHORT TERM GOAL #4   Title Pt will initiate HEP for strength, balance and flexibility in order to indicate improvements in functional mobility and decreased fall risk.   (Target Date: 04/09/15)   Baseline No HEP initiated yet   Time 4   Period Weeks   PT SHORT TERM GOAL #5   Title Pt will ambulate >300' on indoor surfaces while negotiating obstacles without AD at S level to indicate safe negotiation in home.   (Target Date: 04/09/15)   Baseline S with use of cane per pt report, limited community ambulation.    Time 4   Period Weeks           PT Long Term Goals - 03/12/15 1515    PT LONG TERM GOAL #1   Title Pt will independently perform HEP for balance, strength and flexibility to indicate compliance towards improved balance and functional mobility. (Target 05/07/15)   Baseline dependent for HEP 03/12/15   Time 8   Period Weeks   PT LONG TERM GOAL #2   Title Pt will verbalize fall prevention strategies to indicate safe mobliity and negotiation in home and community.  (Target 05/07/15)   Baseline Pt dependent in naming strategies (03/12/15)   Time 8   Period Weeks   PT LONG TERM GOAL #3   Title Pt will  ambulate >300' on outdoor surfaces (including grass, ramp, curb) w/o AD at mod I level to indicate safe negoation in community. (Target 05/07/15)   Baseline Limited community ambulation with cane and S.    Time 8   Period Weeks   PT LONG TERM GOAL #4   Title Pt will increase by 200' in order to indicate improvement in functional endurance.  (Target Date: 05/07/15)   Baseline not yet performed    Time 8   Period Weeks   PT LONG TERM GOAL #5   Title Pt will demonstrate ability to dual task x 5 mins without AD at mod I level to indicate safe return to work.  (Target Date: 05/07/15)   Baseline unable at this time   Time 8   Period Weeks               Plan - 03/26/15 1650    Clinical Impression Statement Pt continues to need max education on brain injury recovery and normal s/s of brain injury.  Note today that pt has central vestibular deficits with saccadic eye movements, increasing dizziness with head and body movement.  Provided education and exercise for compensatory movement to allow pt increase independence with simple tasks at home.     Pt will benefit from skilled therapeutic intervention in order to improve on the following deficits Abnormal gait;Decreased activity tolerance;Decreased balance;Decreased cognition;Decreased endurance;Decreased knowledge of precautions;Decreased mobility;Decreased safety awareness;Decreased strength;Difficulty walking;Dizziness;Impaired perceived functional ability;Impaired flexibility;Impaired UE functional use;Postural dysfunction;Improper body mechanics;Pain;Impaired vision/preception   Rehab Potential Good   Clinical Impairments Affecting Rehab Potential insurance, transportation   PT Frequency 1x / week   PT Duration 8 weeks   PT Treatment/Interventions ADLs/Self Care Home Management;Gait training;DME Instruction;Stair training;Functional mobility training;Therapeutic activities;Therapeutic exercise;Neuromuscular re-education;Balance  training;Cognitive remediation;Patient/family education;Manual techniques;Energy conservation;Vestibular;Visual/perceptual remediation/compensation   PT Next Visit Plan Initiate HEP for balance w/ head movements, continue to assess vestibular  deficits, balance and cognitive dual tasking, simple home tasks involving mobility.    Consulted and Agree with Plan of Care Patient        Problem List Patient Active Problem List   Diagnosis Date Noted  . 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 SAH (subarachnoid hemorrhage) 01/31/2015    Harriet Butte, PT, MPT [facility] [address] [place], [place], [address] Phone: [phone]   Fax:  [phone] 03/26/2015, 4:54 PM

## 2015-03-26 NOTE — Therapy (Signed)
[facility] [address] [place], [place], [address] Phone: [phone]   Fax:  [phone]  Occupational Therapy Treatment  Patient Details  Name: Laurie Booker MRN: [id number] Date of Birth: [birth year] Referring Provider:  Shirlean Kelly, MD  Encounter Date: 03/26/2015      OT End of Session - 03/26/15 1704    Visit Number 3   Number of Visits 9   Date for OT Re-Evaluation 05/11/15   Authorization Type [employer]   OT Start Time 1530   OT Stop Time 1615   OT Time Calculation (min) 45 min   Activity Tolerance Patient tolerated treatment well   Behavior During Therapy [facility] for tasks assessed/performed      Past Medical History  Diagnosis Date  . Osteoporosis   . Blood clots in brain     d/t traumatic brain injury Aug 2016  . Traumatic brain injury     with loss of consciousness, intubated, at [facility] August 2016, closed, hemorrhage  . Anxiety   . Depression     behavioral health admission 2009 or 2010  . H/O suicide attempt 2009  . Bipolar disorder   . H/O transfusion of packed red blood cells   . Chronic kidney disease     follows up with Dr. Lequita Booker: nephrologist in [place]. Pt cannot state what is wrong with her kidneys; just that she has issues with them. (numerous renal cysts on 01/2015 CT)    Past Surgical History  Procedure Laterality Date  . Cesarean section    . Rhinoplasty    . Septoplasty    . Dilation and curettage of uterus    . Colonoscopy w/ polypectomy    . Vocal cord surgery      granulomas removed after intubation d/t suicide attempt  . Microlaryngoscopy with co2 laser and excision of vocal cord lesion N/A 03/19/2015    Procedure: MICROLARYNGOSCOPY WITH CO2 LASER AND EXCISION OF VOCAL CORD LESION;  Surgeon: Laurie Reading, MD;  Location: [facility] OR;  Service: ENT;  Laterality: N/A;  Micro Direct Laryngoscopy with co2 laser excision of bilateral vocal cord granulomas/jet venturi ventilation    There  were no vitals filed for this visit.  Visit Diagnosis:  Impaired cognition  Unsteadiness  Decreased functional mobility and endurance      Subjective Assessment - 03/26/15 1650    Subjective  I don't feel good, I am jittery.  Patient indicated she should have taken her Xanax.   Pertinent History Bipolar, osteoporosis   Currently in Pain? Yes   Pain Score 4    Pain Location Head   Pain Descriptors / Indicators Aching   Pain Type Chronic pain   Pain Onset More than a month ago   Pain Frequency Intermittent   Aggravating Factors  noise, light   Pain Relieving Factors rest   Multiple Pain Sites No              Vestibular Assessment - 03/26/15 0001    Symptom Behavior   Type of Dizziness Unsteady with head/body turns   Frequency of Dizziness with any head movements   Aggravating Factors Looking up to the ceiling;Turning body quickly;Turning head quickly;Turning head sideways;Walking in a crowd;Supine to sit   Relieving Factors Head stationary;Dark room;Closing eyes;Rest;Avoiding busy/distracting areas   Occulomotor Exam   Occulomotor Alignment Normal   Spontaneous Absent   Gaze-induced Comment  L eye only saccadic movement with downward gaze   Smooth Pursuits Saccades  in L eye only with  downward gaze   Saccades Poor trajectory   Comment L eye with poor trajectory inferiorly to superiorly                OT Treatments/Exercises (OP) - 03/26/15 0001    ADLs   Cooking Addressed patient's cognitive skills via simple cooking task.  Patient able to organize, plan and produce three step familiar recipe without difficulty.  Patient reported feeling shaky, and indicated that in retrospect it may have been helpful to have taken her Xanax.  Patient indicates she needs reminders to take this medication failry routinely.     ADL Comments Worked with patient on a familiar task of planning weekly menu for her family.  Patient easily overwhelmed , and needing frequent cues to  remain calm and consider one step at a time.  Patient vascilating between angry behavior, to tearful - patient needing extra time and quiet space to regain control.  Today patient able to accept feedback and begin to change behavioral response.  e.g. upon reaching point of frustration, speed of language and tone of voice markedly increased - when asked "are you angry" patient indicated she was angry - but not at task at hand, at her current situation.  When brought back to task at hand, patient able to return to calm, thoughtful approach .  Patient was directed to assess kitchen supplies, and plan and prepare a familiar meal, which involved several steps.  Patient planning to make homemade potato soup for herself, and her son Laurie Booker.                  OT Education - 03/26/15 1703    Education provided Yes   Education Details Brain injury recovery, attention, role of physiatrist   Person(s) Educated Patient   Methods Explanation   Comprehension Need further instruction;Verbalized understanding          OT Short Term Goals - 03/18/15 0942    OT SHORT TERM GOAL #1   Title Patient will be independent with a home exercise program   Baseline Patient is not currently exercising at this time   Period Weeks   Status On-going   OT SHORT TERM GOAL #2   Title Patient will demostrate sufficient activity tolerance to fold one load of clothing without rest break.   Baseline Patient relies on family and frineds to help with laundry as she is too fatigued to complete entire cycle since injury   Status On-going   OT SHORT TERM GOAL #3   Title Patient will attend to organiztional task, e.g. placing appointments into calendar in smart phone, in minimally distracting environment with no greater than minimal cueing.   Status On-going   OT SHORT TERM GOAL #4   Title Patient will prepare simple meal, completing two dishes simultaneously alternating her attention between these tasks with minimal cueing  initially.   Baseline Currently - min assist   Time 4   Period Weeks   Status On-going           OT Long Term Goals - 03/18/15 1610    OT LONG TERM GOAL #1   Title Patient will be independent in updated HEP   Time 8   Period Weeks   Status On-going   OT LONG TERM GOAL #2   Title Patient will complete simple housework for 45 minutes without break longer than 5 min   Baseline Unable to complete, bathing, dressing and household mobility significantly tax energy at this time - dependent  for household cleaning   Time 8   Period Weeks   Status On-going   OT LONG TERM GOAL #3   Title Patient will actively divide her attention between two work related tasks in moderately distracting environment, e.g. typing specific text on computer, while answering phone and directing caller   Baseline Patient unable to work at this time - dependent   Time 8   Period Weeks   Status On-going   OT LONG TERM GOAL #4   Title Patient will prepare a full hot meal safely navigating in kitchen and using stove top and or oven independently   Baseline Patient is completing minimal cooking at this time due to mobilty deficits, and fatigue, anxiety - max assist   Time 8   Period Weeks   Status On-going               Plan - 03/26/15 1705    Clinical Impression Statement Patient is making progress toward her goals, however, slightly set back this week by recent surgery to remove granulomas.  Patient needs reassurance, and further reinforcement of brain injury recovery as she hopes to return to more independent living.     Pt will benefit from skilled therapeutic intervention in order to improve on the following deficits (Retired) Decreased coordination;Decreased activity tolerance;Decreased balance;Decreased cognition;Decreased endurance;Decreased mobility;Decreased strength;Decreased safety awareness;Impaired UE functional use;Impaired vision/preception;Improper body mechanics;Pain;Impaired  flexibility;Decreased range of motion   Rehab Potential Good   OT Frequency 1x / week   OT Duration 8 weeks   OT Treatment/Interventions Self-care/ADL training;Cryotherapy;Moist Heat;Therapeutic exercise;Neuromuscular education;DME and/or AE instruction;Building services engineer;Therapeutic activities;Cognitive remediation/compensation;Visual/perceptual remediation/compensation;Patient/family education;Balance training   Plan more in depth vision assessment, selective attention, self organization, strengthening program for proximal strengthening   OT Home Exercise Plan Please initiate- needs proximal strengthening and conditioning    Consulted and Agree with Plan of Care Patient        Problem List Patient Active Problem List   Diagnosis Date Noted  . 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 SAH (subarachnoid hemorrhage) 01/31/2015    Collier Salina, OTR/L 03/26/2015, 5:09 PM  [facility] [address] [place], [place], [address] Phone: [phone]   Fax:  [phone]

## 2015-03-26 NOTE — Patient Instructions (Signed)
  Please complete the assigned speech therapy homework and return it to your next session.  

## 2015-03-30 ENCOUNTER — Encounter: Payer: Self-pay | Admitting: Physical Medicine & Rehabilitation

## 2015-03-30 ENCOUNTER — Encounter
Payer: PRIVATE HEALTH INSURANCE | Attending: Physical Medicine & Rehabilitation | Admitting: Physical Medicine & Rehabilitation

## 2015-03-30 ENCOUNTER — Other Ambulatory Visit: Payer: Self-pay | Admitting: Physical Medicine & Rehabilitation

## 2015-03-30 VITALS — BP 123/81 | HR 82

## 2015-03-30 DIAGNOSIS — S069X2S Unspecified intracranial injury with loss of consciousness of 31 minutes to 59 minutes, sequela: Secondary | ICD-10-CM | POA: Insufficient documentation

## 2015-03-30 DIAGNOSIS — M810 Age-related osteoporosis without current pathological fracture: Secondary | ICD-10-CM | POA: Insufficient documentation

## 2015-03-30 DIAGNOSIS — Z79899 Other long term (current) drug therapy: Secondary | ICD-10-CM | POA: Insufficient documentation

## 2015-03-30 DIAGNOSIS — Z5181 Encounter for therapeutic drug level monitoring: Secondary | ICD-10-CM | POA: Insufficient documentation

## 2015-03-30 DIAGNOSIS — S069XAS Unspecified intracranial injury with loss of consciousness status unknown, sequela: Secondary | ICD-10-CM | POA: Insufficient documentation

## 2015-03-30 DIAGNOSIS — G44309 Post-traumatic headache, unspecified, not intractable: Secondary | ICD-10-CM | POA: Diagnosis not present

## 2015-03-30 DIAGNOSIS — F319 Bipolar disorder, unspecified: Secondary | ICD-10-CM | POA: Insufficient documentation

## 2015-03-30 DIAGNOSIS — F3161 Bipolar disorder, current episode mixed, mild: Secondary | ICD-10-CM | POA: Insufficient documentation

## 2015-03-30 DIAGNOSIS — S069X9A Unspecified intracranial injury with loss of consciousness of unspecified duration, initial encounter: Secondary | ICD-10-CM | POA: Insufficient documentation

## 2015-03-30 DIAGNOSIS — W1830XS Fall on same level, unspecified, sequela: Secondary | ICD-10-CM | POA: Insufficient documentation

## 2015-03-30 DIAGNOSIS — S069X0S Unspecified intracranial injury without loss of consciousness, sequela: Secondary | ICD-10-CM | POA: Insufficient documentation

## 2015-03-30 DIAGNOSIS — N189 Chronic kidney disease, unspecified: Secondary | ICD-10-CM | POA: Diagnosis not present

## 2015-03-30 DIAGNOSIS — I669 Occlusion and stenosis of unspecified cerebral artery: Secondary | ICD-10-CM | POA: Diagnosis not present

## 2015-03-30 DIAGNOSIS — F419 Anxiety disorder, unspecified: Secondary | ICD-10-CM | POA: Diagnosis not present

## 2015-03-30 MED ORDER — PROPRANOLOL HCL 20 MG PO TABS: 20.0000 mg | ORAL_TABLET | Freq: Three times a day (TID) | ORAL | Status: DC
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
## 2015-03-30 NOTE — Patient Instructions (Addendum)
CHART YOUR SLEEP=====GOAL IS 8-10 HOURS PER NIGHT   WORK ON KEEPING YOURSELF ORGANIZED.   KEEP DISTRACTIONS AND VISITORS TO A MINIMUM.      PROPRANOLOL:   BEGIN AT NIGHT FOR 3 DAYS THEN TWICE DAILY FOR 3 DAYS, THEN THREE X DAILY THEREAFTER.

## 2015-03-31 ENCOUNTER — Ambulatory Visit: Payer: PRIVATE HEALTH INSURANCE | Attending: Neurosurgery | Admitting: Occupational Therapy

## 2015-03-31 ENCOUNTER — Encounter: Payer: Self-pay | Admitting: Occupational Therapy

## 2015-03-31 ENCOUNTER — Ambulatory Visit: Payer: PRIVATE HEALTH INSURANCE | Admitting: Rehabilitation

## 2015-03-31 ENCOUNTER — Ambulatory Visit: Payer: PRIVATE HEALTH INSURANCE | Admitting: Speech Pathology

## 2015-03-31 DIAGNOSIS — R4189 Other symptoms and signs involving cognitive functions and awareness: Secondary | ICD-10-CM | POA: Insufficient documentation

## 2015-03-31 DIAGNOSIS — R41841 Cognitive communication deficit: Secondary | ICD-10-CM | POA: Insufficient documentation

## 2015-03-31 DIAGNOSIS — R269 Unspecified abnormalities of gait and mobility: Secondary | ICD-10-CM | POA: Diagnosis present

## 2015-03-31 DIAGNOSIS — R42 Dizziness and giddiness: Secondary | ICD-10-CM | POA: Diagnosis present

## 2015-03-31 DIAGNOSIS — Z7409 Other reduced mobility: Secondary | ICD-10-CM | POA: Insufficient documentation

## 2015-03-31 DIAGNOSIS — R2681 Unsteadiness on feet: Secondary | ICD-10-CM

## 2015-03-31 LAB — PMP ALCOHOL METABOLITE (ETG): Ethyl Glucuronide (EtG): NEGATIVE ng/mL

## 2015-03-31 NOTE — Therapy (Signed)
[facility] [address] [place], [place], [address] Phone: [phone]   Fax:  [phone]  Occupational Therapy Treatment  Patient Details  Name: Meagon Duskin MRN: [id number] Date of Birth: [birth year] Referring Provider:  Shirlean Kelly, MD  Encounter Date: 03/31/2015      OT End of Session - 03/31/15 1732    Visit Number 4   Number of Visits 9   Date for OT Re-Evaluation 05/11/15   Authorization Type [employer]   OT Start Time 1445   OT Stop Time 1525   OT Time Calculation (min) 40 min   Activity Tolerance Patient tolerated treatment well   Behavior During Therapy [facility] for tasks assessed/performed      Past Medical History  Diagnosis Date  . Osteoporosis   . Blood clots in brain     d/t traumatic brain injury Aug 2016  . Traumatic brain injury [facility])     with loss of consciousness, intubated, at [facility] August 2016, closed, hemorrhage  . Anxiety   . Depression     behavioral health admission 2009 or 2010  . H/O suicide attempt 2009  . Bipolar disorder (HCC)   . H/O transfusion of packed red blood cells   . Chronic kidney disease     follows up with Dr. Lequita Halt: nephrologist in [place]. Pt cannot state what is wrong with her kidneys; just that she has issues with them. (numerous renal cysts on 01/2015 CT)    Past Surgical History  Procedure Laterality Date  . Cesarean section    . Rhinoplasty    . Septoplasty    . Dilation and curettage of uterus    . Colonoscopy w/ polypectomy    . Vocal cord surgery      granulomas removed after intubation d/t suicide attempt  . Microlaryngoscopy with co2 laser and excision of vocal cord lesion N/A 03/19/2015    Procedure: MICROLARYNGOSCOPY WITH CO2 LASER AND EXCISION OF VOCAL CORD LESION;  Surgeon: Christia Reading, MD;  Location: [facility] OR;  Service: ENT;  Laterality: N/A;  Micro Direct Laryngoscopy with co2 laser excision of bilateral vocal cord granulomas/jet venturi ventilation     There were no vitals filed for this visit.  Visit Diagnosis:  Impaired cognition      Subjective Assessment - 03/31/15 1720    Subjective  I feel better today.     Pertinent History Bipolar, osteoporosis   Currently in Pain? No/denies   Pain Score 0-No pain                      OT Treatments/Exercises (OP) - 03/31/15 0001    Cognitive Exercises   Other Cognitive Exercises 1 Patient much calmer today in her treatment session.  Patient saw physiatrist this week, and reports this visit was extremely helpful in determining the severity of her brain injury, and its impact on her daily life.  Physiatrist indicated that patient would not be ready for return to work for a few months.  Although this was a difficult message for patient to hear, she had less distraction on return to work fears.  She and MD discussed importance of sleep - patient has been getting less than 6 hours of sleep since TBI, and really needs 10 hrs / night to help with healing.  Patient plans to speak with employer this week to discuss recovery time.  Reviewed the importance of preparing herself for this phone call, as it is likely going to be very  emotional.  Discussed preparing talking points, and self preparation that may be helpful prior to speaking with employer.  Also discussed potential issues which restrict driving, most notably head and eye movement / dysequilibrium, as well as the role distractions play on focused attention.     Other Cognitive Exercises 2 Patient completed the [facility]  today 24/30 - with errors in attention - serial subtraction, and memory, delayed recall.  Patient completed in minimally distracting environment.                  OT Education - 03/31/15 1731    Education provided Yes   Education Details preparatory steps prior to discussion with employer   Person(s) Educated Patient   Methods Explanation   Comprehension Verbalized understanding          OT Short Term  Goals - 03/18/15 0942    OT SHORT TERM GOAL #1   Title Patient will be independent with a home exercise program   Baseline Patient is not currently exercising at this time   Period Weeks   Status On-going   OT SHORT TERM GOAL #2   Title Patient will demostrate sufficient activity tolerance to fold one load of clothing without rest break.   Baseline Patient relies on family and frineds to help with laundry as she is too fatigued to complete entire cycle since injury   Status On-going   OT SHORT TERM GOAL #3   Title Patient will attend to organiztional task, e.g. placing appointments into calendar in smart phone, in minimally distracting environment with no greater than minimal cueing.   Status On-going   OT SHORT TERM GOAL #4   Title Patient will prepare simple meal, completing two dishes simultaneously alternating her attention between these tasks with minimal cueing initially.   Baseline Currently - min assist   Time 4   Period Weeks   Status On-going           OT Long Term Goals - 03/18/15 0942    OT LONG TERM GOAL #1   Title Patient will be independent in updated HEP   Time 8   Period Weeks   Status On-going   OT LONG TERM GOAL #2   Title Patient will complete simple housework for 45 minutes without break longer than 5 min   Baseline Unable to complete, bathing, dressing and household mobility significantly tax energy at this time - dependent for household cleaning   Time 8   Period Weeks   Status On-going   OT LONG TERM GOAL #3   Title Patient will actively divide her attention between two work related tasks in moderately distracting environment, e.g. typing specific text on computer, while answering phone and directing caller   Baseline Patient unable to work at this time - dependent   Time 8   Period Weeks   Status On-going   OT LONG TERM GOAL #4   Title Patient will prepare a full hot meal safely navigating in kitchen and using stove top and or oven independently    Baseline Patient is completing minimal cooking at this time due to mobilty deficits, and fatigue, anxiety - max assist   Time 8   Period Weeks   Status On-going               Plan - 03/31/15 1732    Clinical Impression Statement Patient is making steady improvement in cognitive abilities of attention (selective), insight, and problem identification.     Pt will  benefit from skilled therapeutic intervention in order to improve on the following deficits (Retired) Decreased coordination;Decreased activity tolerance;Decreased balance;Decreased cognition;Decreased endurance;Decreased mobility;Decreased strength;Decreased safety awareness;Impaired UE functional use;Impaired vision/preception;Improper body mechanics;Pain;Impaired flexibility;Decreased range of motion   Rehab Potential Good   OT Frequency 1x / week   OT Duration 8 weeks   OT Treatment/Interventions Self-care/ADL training;Cryotherapy;Moist Heat;Therapeutic exercise;Neuromuscular education;DME and/or AE instruction;Building services engineer;Therapeutic activities;Cognitive remediation/compensation;Visual/perceptual remediation/compensation;Patient/family education;Balance training   Plan self organization, selective to alternating attention   Consulted and Agree with Plan of Care Patient        Problem List Patient Active Problem List   Diagnosis Date Noted  . Traumatic brain injury with loss of consciousness (HCC) 03/30/2015  . 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 Bipolar disorder (HCC) 03/30/2015  . 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 SAH (subarachnoid hemorrhage) (HCC) 01/31/2015    Collier Salina, OTR/L 03/31/2015, 5:36 PM  [facility] [address] [place], [place], [address] Phone: [phone]   Fax:  [phone]

## 2015-03-31 NOTE — Therapy (Signed)
[facility] [address] [place], [place], [address] Phone: [phone]   Fax:  [phone]  Physical Therapy Treatment  Patient Details  Name: Laurie Booker MRN: [id number] Date of Birth: Apr 01, 1966 Referring Provider:  Shirlean Kelly, MD  Encounter Date: 03/31/2015      PT End of Session - 03/31/15 1629    Visit Number 3   Number of Visits 9   Date for PT Re-Evaluation 05/11/15   Authorization Type [employer]    PT Start Time 1400   PT Stop Time 1446   PT Time Calculation (min) 46 min   Activity Tolerance Patient tolerated treatment well   Behavior During Therapy [facility] for tasks assessed/performed      Past Medical History  Diagnosis Date  . Osteoporosis   . Blood clots in brain     d/t traumatic brain injury Aug 2016  . Traumatic brain injury [facility])     with loss of consciousness, intubated, at [facility] August 2016, closed, hemorrhage  . Anxiety   . Depression     behavioral health admission 2009 or 2010  . H/O suicide attempt 2009  . Bipolar disorder (HCC)   . H/O transfusion of packed red blood cells   . Chronic kidney disease     follows up with Dr. Lequita Halt: nephrologist in [place]. Pt cannot state what is wrong with her kidneys; just that she has issues with them. (numerous renal cysts on 01/2015 CT)    Past Surgical History  Procedure Laterality Date  . Cesarean section    . Rhinoplasty    . Septoplasty    . Dilation and curettage of uterus    . Colonoscopy w/ polypectomy    . Vocal cord surgery      granulomas removed after intubation d/t suicide attempt  . Microlaryngoscopy with co2 laser and excision of vocal cord lesion N/A 03/19/2015    Procedure: MICROLARYNGOSCOPY WITH CO2 LASER AND EXCISION OF VOCAL CORD LESION;  Surgeon: Christia Reading, MD;  Location: [facility] OR;  Service: ENT;  Laterality: N/A;  Micro Direct Laryngoscopy with co2 laser excision of bilateral vocal cord granulomas/jet venturi ventilation     There were no vitals filed for this visit.  Visit Diagnosis:  Abnormality of gait  Unsteadiness  Dizziness and giddiness      Subjective Assessment - 03/31/15 1408    Subjective Pt reports visiting Dr. Riley Kill and states visit went well. Also reports that he added BP? medication to assist with anxiety and sleeping.  She states only taken it once.     Limitations House hold activities;Walking   Patient Stated Goals To improve balance, equilibrium and return to work.    Currently in Pain? No/denies   Pain Score 6    Pain Location Head   Pain Orientation Right;Posterior   Pain Descriptors / Indicators Aching   Pain Type Chronic pain   Pain Onset More than a month ago   Aggravating Factors  noise, light   Pain Relieving Factors rest                         [facility] Adult PT Treatment/Exercise - 03/31/15 1424    Ambulation/Gait   Ambulation/Gait Yes   Ambulation/Gait Assistance 5: Supervision   Ambulation Distance (Feet) 345 Feet  and 115' x 2, 4 reps of 30'   Assistive device None   Gait Pattern Step-through pattern;Decreased arm swing - right;Decreased arm swing - left;Decreased stride length;Decreased weight  shift to right;Decreased weight shift to left;Trunk flexed   Ambulation Surface Level;Indoor   Gait Comments Performed gait with visual/vestibular tasks, see NMR for details.     Neuro Re-ed    Neuro Re-ed Details  Performed gait throughout session with focus on vestibular and visual adaptation.  Performed gait walking towards target with small horizontal head turns>vertical head turns.  Pt with increased difficulty initially keeping eyes on target, therefore had pt stand approx 4-9ft from target and perform gaze stabilization.  This increased patient's dizziness to 3/10 from 0/10.  Pt then performed gait around therapy gym with horizontal head turns as well as vertical head turns x 2 reps of 115' with no marked increased in dizziness, pt stating "I feel ok".   Note was difficult for pt to give concrete answer for dizziness during session.  Progressed to reaching task down and up (from lower stool then to floor) and back up to cabinet in kitchen placing and retreiving cards while visual targeting.  Pt able to move in quick fashion with no overt LOB and increase to only 1-2/10 dizziness.  Ended session with scanning task for cards on wall (plain background) called out by therapist.  Again, no increase in dizziness with task.  Only continued to c/o headache, but was no worse than beginning of session.     Exercises   Exercises --   Other Exercises  --       NMR during gait:  Initiated session with compensatory eye, head, body movements from last session during gait x 230'.  Tolerated well with no increase in dizziness.           PT Education - 03/31/15 1628    Education provided Yes   Education Details Continue to encourage pt to perform small meal prep and planning at home, begin to plan medication management and have friend (that normally assists with this) double check behind her for accuracy.  Educated on possible over stimulation at soccer game, esp following all three therapies.     Person(s) Educated Patient   Methods Explanation   Comprehension Verbalized understanding          PT Short Term Goals - 03/12/15 1507    PT SHORT TERM GOAL #1   Title Pt will perform in order to assess functional endurance.  (Target Date: 04/09/15)   Baseline Not performed due to time contraint   Time 4   Period Weeks   PT SHORT TERM GOAL #2   Title Pt will improve gait speed to 2.78 ft/sec to indicate decreased fall risk and improved efficiency of gait.  (Target Date: 04/09/15)   Baseline 2.18 on 03/12/15   Time 4   Period Weeks   PT SHORT TERM GOAL #3   Title Pt will increase cognitive TUG score to <15 secs in order to indicate functional improvement in balance with dual tasking.   (Target Date: 04/09/15)   Baseline 15.28 secs on 03/12/15   Time  4   Period Weeks   PT SHORT TERM GOAL #4   Title Pt will initiate HEP for strength, balance and flexibility in order to indicate improvements in functional mobility and decreased fall risk.   (Target Date: 04/09/15)   Baseline No HEP initiated yet   Time 4   Period Weeks   PT SHORT TERM GOAL #5   Title Pt will ambulate >300' on indoor surfaces while negotiating obstacles without AD at S level to indicate safe negotiation in home.   (  Target Date: 04/09/15)   Baseline S with use of cane per pt report, limited community ambulation.    Time 4   Period Weeks           PT Long Term Goals - 03/12/15 1515    PT LONG TERM GOAL #1   Title Pt will independently perform HEP for balance, strength and flexibility to indicate compliance towards improved balance and functional mobility. (Target 05/07/15)   Baseline dependent for HEP 03/12/15   Time 8   Period Weeks   PT LONG TERM GOAL #2   Title Pt will verbalize fall prevention strategies to indicate safe mobliity and negotiation in home and community.  (Target 05/07/15)   Baseline Pt dependent in naming strategies (03/12/15)   Time 8   Period Weeks   PT LONG TERM GOAL #3   Title Pt will ambulate >300' on outdoor surfaces (including grass, ramp, curb) w/o AD at mod I level to indicate safe negoation in community. (Target 05/07/15)   Baseline Limited community ambulation with cane and S.    Time 8   Period Weeks   PT LONG TERM GOAL #4   Title Pt will increase by 200' in order to indicate improvement in functional endurance.  (Target Date: 05/07/15)   Baseline not yet performed    Time 8   Period Weeks   PT LONG TERM GOAL #5   Title Pt will demonstrate ability to dual task x 5 mins without AD at mod I level to indicate safe return to work.  (Target Date: 05/07/15)   Baseline unable at this time   Time 8   Period Weeks               Plan - 03/31/15 1630    Clinical Impression Statement Pt seemed overall in better mood during  session today.  Reports that she had taken xanax and was also pleased with visit with Dr. Riley Kill.  Feel that she is less stressed about work now that rehab MD has written pt out of work until beginning of next year.  Continue to address visual/vestibular adaptation during session.  Marked improvement in toleration of activity today with no more than 3/10 dizziness.  Will continue to assess and provide adaptation exercises as able.    Pt will benefit from skilled therapeutic intervention in order to improve on the following deficits Abnormal gait;Decreased activity tolerance;Decreased balance;Decreased cognition;Decreased endurance;Decreased knowledge of precautions;Decreased mobility;Decreased safety awareness;Decreased strength;Difficulty walking;Dizziness;Impaired perceived functional ability;Impaired flexibility;Impaired UE functional use;Postural dysfunction;Improper body mechanics;Pain;Impaired vision/preception   Rehab Potential Good   Clinical Impairments Affecting Rehab Potential insurance, transportation   PT Frequency 1x / week   PT Duration 8 weeks   PT Treatment/Interventions ADLs/Self Care Home Management;Gait training;DME Instruction;Stair training;Functional mobility training;Therapeutic activities;Therapeutic exercise;Neuromuscular re-education;Balance training;Cognitive remediation;Patient/family education;Manual techniques;Energy conservation;Vestibular;Visual/perceptual remediation/compensation   PT Next Visit Plan 6 MWT, Add to vestibular HEP for more adaptation, balance with cognitive dual tasking.    Consulted and Agree with Plan of Care Patient        Problem List Patient Active Problem List   Diagnosis Date Noted  . Traumatic brain injury with loss of consciousness (HCC) 03/30/2015  . 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 Bipolar disorder (HCC) 03/30/2015  . 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 SAH (subarachnoid hemorrhage) (HCC) 01/31/2015    Harriet Butte, PT, MPT [facility] [address] [place], [place], [address] Phone: [phone]   Fax:  [phone] 03/31/2015, 4:35 PM

## 2015-03-31 NOTE — Therapy (Signed)
[facility] [address] [place], [place], [address] Phone: [phone]   Fax:  [phone]  Speech Language Pathology Treatment  Patient Details  Name: Laurie Booker MRN: [id number] Date of Birth: May 06, 1966 Referring Provider:  Shirlean Kelly, MD  Encounter Date: 03/31/2015      End of Session - 03/31/15 1411    Visit Number 4   Number of Visits 16   Date for SLP Re-Evaluation 05/26/15   SLP Start Time 1317   SLP Stop Time  1402   SLP Time Calculation (min) 45 min      Past Medical History  Diagnosis Date  . Osteoporosis   . Blood clots in brain     d/t traumatic brain injury Aug 2016  . Traumatic brain injury [facility])     with loss of consciousness, intubated, at [facility] August 2016, closed, hemorrhage  . Anxiety   . Depression     behavioral health admission 2009 or 2010  . H/O suicide attempt 2009  . Bipolar disorder (HCC)   . H/O transfusion of packed red blood cells   . Chronic kidney disease     follows up with Dr. Lequita Halt: nephrologist in [place]. Pt cannot state what is wrong with her kidneys; just that she has issues with them. (numerous renal cysts on 01/2015 CT)    Past Surgical History  Procedure Laterality Date  . Cesarean section    . Rhinoplasty    . Septoplasty    . Dilation and curettage of uterus    . Colonoscopy w/ polypectomy    . Vocal cord surgery      granulomas removed after intubation d/t suicide attempt  . Microlaryngoscopy with co2 laser and excision of vocal cord lesion N/A 03/19/2015    Procedure: MICROLARYNGOSCOPY WITH CO2 LASER AND EXCISION OF VOCAL CORD LESION;  Surgeon: Christia Reading, MD;  Location: [facility] OR;  Service: ENT;  Laterality: N/A;  Micro Direct Laryngoscopy with co2 laser excision of bilateral vocal cord granulomas/jet venturi ventilation    There were no vitals filed for this visit.  Visit Diagnosis: Cognitive communication deficit      Subjective Assessment -  03/31/15 1322    Subjective "I saw Dr. Riley Kill today - he said I could not go back to work after the 1st of the year - I cried"   Pain Score 5    Pain Location Neck   Pain Orientation Right   Pain Descriptors / Indicators Aching   Pain Type Chronic pain   Pain Onset More than a month ago   Pain Frequency Intermittent               ADULT SLP TREATMENT - 03/31/15 1334    General Information   Behavior/Cognition Alert;Cooperative;Pleasant mood   Treatment Provided   Treatment provided Cognitive-Linquistic   Cognitive-Linquistic Treatment   Treatment focused on Cognition   Skilled Treatment Facilitated alternating attention sorting cards into 3 piles, each with different rule for sorting  with extended time and occasional min cues to attend to all piles, pt required usual re-directions due to internal distractions and verbalizeing worries/anxieties.  Initiated Chief Operating Officer for moderately complex data, with  occasional min verbal cues.    Assessment / Recommendations / Plan   Plan Continue with current plan of care   Progression Toward Goals   Progression toward goals Progressing toward goals          SLP Education - 03/31/15 1408  Education provided Yes   Education Details compensations for attention   Person(s) Educated Patient   Methods Explanation   Comprehension Verbalized understanding;Verbal cues required;Need further instruction          SLP Short Term Goals - 03/31/15 1410    SLP SHORT TERM GOAL #1   Title pt will demo alternating attention during mod complex cognitive-linguistic tasks in ST room with modified independence (pt checking for errors)   Baseline WNL in simple tasks    Time 2   Period Weeks   Status On-going   SLP SHORT TERM GOAL #2   Title pt will tell SLP 6 memory strategies/compensations   Baseline none provided yet   Time 2   Period Weeks   Status On-going   SLP SHORT TERM GOAL #3   Title pt will demo emergent  awareness and selective attention adequate to quell verbosity during therapy tasks with rare nonverbal cues   Time 2   Period Weeks   Status New          SLP Long Term Goals - 03/31/15 1410    SLP LONG TERM GOAL #1   Title pt will utilize memory strategies during 4 therapy sessions   Baseline none demonstrated   Time 6   Period Weeks   Status On-going   SLP LONG TERM GOAL #2   Title pt will demo alternating attention in a mod distracting environment with modified independence (pt will self-correct/double check)   Baseline WNL alternating attention for simple tasks in quiet environment   Time 6   Period Weeks   Status On-going   SLP LONG TERM GOAL #3   Title pt will demo emergent awareness, and attention appropriate for witholding extraneous conversation during therapy tasks, independently   Time 6   Period Weeks   Status On-going          Plan - 03/31/15 1408    Clinical Impression Statement Alternating attention tasks affected by pt's internal distractions or worry/anxiety and results of appointment with Dr.Swartz yesterday. She required usual re-direction to tasks. Organization of information with min A. Continue skilled ST to maximize cognition for possible return to work.   Speech Therapy Frequency 2x / week   Treatment/Interventions Internal/external aids;Compensatory strategies;Cognitive reorganization;SLP instruction and feedback;Patient/family education;Functional tasks;Cueing hierarchy   Potential to Achieve Goals Good        Problem List Patient Active Problem List   Diagnosis Date Noted  . Traumatic brain injury with loss of consciousness (HCC) 03/30/2015  . 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 Bipolar disorder (HCC) 03/30/2015  . 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 SAH (subarachnoid hemorrhage) (HCC) 01/31/2015    Lovvorn, Radene Journey MS, CCC-SLP 03/31/2015, 2:12 PM  [facility] [address] [place], [place], [address] Phone: [phone]   Fax:  [phone]

## 2015-03-31 NOTE — Patient Instructions (Signed)
   Cognitive Activities you can do at home:   - Solitaire  - Majong  - Scrabble  - Chess/Checkers  - Crosswords (easy level)  - Uno  - Card Games  - Board Games  - Connect 4  - Simon  - the Memory Game  On your computer, tablet or phone: BrainHQ Brainbashers.com Neuronation App Memory Match Game App Rush Hour Chocolate Fix Sort it out Scrabble pics App Cracker Barrel App Photo Quiz App MixTwo App What's the Word?App  

## 2015-04-05 LAB — BENZODIAZEPINES (GC/LC/MS), URINE
Alprazolam metabolite (GC/LC/MS), ur confirm: 297 ng/mL — AB (ref <25)
Clonazepam metabolite (GC/LC/MS), ur confirm: NEGATIVE ng/mL (ref <25)
Flurazepam metabolite (GC/LC/MS), ur confirm: NEGATIVE ng/mL (ref <50)
Lorazepam (GC/LC/MS), ur confirm: NEGATIVE ng/mL (ref <50)
Midazolam (GC/LC/MS), ur confirm: NEGATIVE ng/mL (ref <50)
Nordiazepam (GC/LC/MS), ur confirm: NEGATIVE ng/mL (ref <50)
Oxazepam (GC/LC/MS), ur confirm: NEGATIVE ng/mL (ref <50)
Temazepam (GC/LC/MS), ur confirm: NEGATIVE ng/mL (ref <50)
Triazolam metabolite (GC/LC/MS), ur confirm: NEGATIVE ng/mL (ref <50)

## 2015-04-06 ENCOUNTER — Ambulatory Visit: Payer: PRIVATE HEALTH INSURANCE | Admitting: Speech Pathology

## 2015-04-06 ENCOUNTER — Ambulatory Visit: Payer: PRIVATE HEALTH INSURANCE | Admitting: Physical Therapy

## 2015-04-06 ENCOUNTER — Ambulatory Visit: Payer: PRIVATE HEALTH INSURANCE | Admitting: Occupational Therapy

## 2015-04-06 LAB — PRESCRIPTION MONITORING PROFILE (SOLSTAS)
Amphetamine/Meth: NEGATIVE ng/mL
Barbiturate Screen, Urine: NEGATIVE ng/mL
Buprenorphine, Urine: NEGATIVE ng/mL
Cannabinoid Scrn, Ur: NEGATIVE ng/mL
Carisoprodol, Urine: NEGATIVE ng/mL
Cocaine Metabolites: NEGATIVE ng/mL
Creatinine, Urine: 77.18 mg/dL (ref >20.0)
Fentanyl, Ur: NEGATIVE ng/mL
MDMA URINE: NEGATIVE ng/mL
Meperidine, Ur: NEGATIVE ng/mL
Methadone Screen, Urine: NEGATIVE ng/mL
Nitrites, Initial: NEGATIVE ug/mL
Opiate Screen, Urine: NEGATIVE ng/mL
Oxycodone Screen, Ur: NEGATIVE ng/mL
Propoxyphene: NEGATIVE ng/mL
Tapentadol, urine: NEGATIVE ng/mL
Tramadol Scrn, Ur: NEGATIVE ng/mL
Zolpidem, Urine: NEGATIVE ng/mL
pH, Initial: 7.2 pH (ref 4.5–8.9)

## 2015-04-07 ENCOUNTER — Telehealth: Payer: Self-pay | Admitting: Physical Medicine & Rehabilitation

## 2015-04-07 NOTE — Telephone Encounter (Signed)
Patient wanted to let Dr. Riley KillSwartz know she is only getting 6-7hrs of sleep for the past 9 days and he wanted her to get 10-11hrs.  She would also like to know when will she be able to drive again?  Please call her at [phone].

## 2015-04-07 NOTE — Progress Notes (Signed)
Urine drug screen for this encounter is consistent for prescribed medication 

## 2015-04-08 MED ORDER — TRAZODONE HCL 50 MG PO TABS: 50.0000 mg | ORAL_TABLET | Freq: Every day | ORAL | Status: DC

## 2015-04-08 NOTE — Telephone Encounter (Signed)
A reasonable goal would be 8-10 hours of sleep per night. If she is still struggling to sleep, let's try trazodone 50mg  qhs #[id number].  thanks

## 2015-04-08 NOTE — Telephone Encounter (Signed)
Left message on her voicemail per DPR that med sent to pharmacy.

## 2015-04-09 ENCOUNTER — Telehealth: Payer: Self-pay | Admitting: Physical Medicine & Rehabilitation

## 2015-04-09 NOTE — Telephone Encounter (Signed)
Please advise 

## 2015-04-09 NOTE — Telephone Encounter (Signed)
i addressed this on an earlier message(driving). If she hasn't signed a contract with us, there's really no reason she can't take the hydrocodone---just would try to limit it given its neurosedating potential

## 2015-04-09 NOTE — Telephone Encounter (Signed)
Patient left message earlier this week needing to know if she would be able to drive and also she was given some hydrocodone while she was in hospital and wanted to know if she could take this?  Please call patient.

## 2015-04-13 ENCOUNTER — Ambulatory Visit: Payer: PRIVATE HEALTH INSURANCE

## 2015-04-13 ENCOUNTER — Ambulatory Visit: Payer: PRIVATE HEALTH INSURANCE | Admitting: Rehabilitation

## 2015-04-13 ENCOUNTER — Ambulatory Visit: Payer: PRIVATE HEALTH INSURANCE | Admitting: Occupational Therapy

## 2015-04-14 ENCOUNTER — Telehealth: Payer: Self-pay | Admitting: Physical Medicine & Rehabilitation

## 2015-04-14 NOTE — Telephone Encounter (Signed)
Patient called requesting something for her headaches, tylenol is not helping it.  She stated she had been taking hydrocodone that she received in hospital but has not got a refill from Dr. Riley KillSwartz.

## 2015-04-15 NOTE — Telephone Encounter (Signed)
Dr. Melvenia BeamMitchell Gore writes her Hydrocodone prescriptions. Patient needs to contact him for a refill as Dr. Riley KillSwartz is out of town and will not return until 04-23-15. Please and thank you.

## 2015-04-15 NOTE — Telephone Encounter (Signed)
LMTCB.. MAH 

## 2015-04-16 ENCOUNTER — Telehealth: Payer: Self-pay | Admitting: *Deleted

## 2015-04-16 NOTE — Telephone Encounter (Signed)
Pt called back. She was calling to confirm if it was okay to take her pain medication. I read Dr. Rosalyn ChartersSwartz's previous message in which he stated that since she was not under contract it would be okay for her to take her hydrocodone. She also brought up the issue of driving (she states that she is not currently driving).  She says she forgot to bring this up at her 03/30/2015 appt with Dr. Riley KillSwartz.  I explained Dr. Riley KillSwartz was out of town on family emergency and would be back in town next week.  She said that's fine and will try to remember to bring that up at her next visit on 05/04/2015

## 2015-04-20 ENCOUNTER — Ambulatory Visit: Payer: PRIVATE HEALTH INSURANCE | Admitting: Occupational Therapy

## 2015-04-20 ENCOUNTER — Ambulatory Visit: Payer: PRIVATE HEALTH INSURANCE | Admitting: Speech Pathology

## 2015-04-20 ENCOUNTER — Encounter: Payer: Self-pay | Admitting: Occupational Therapy

## 2015-04-20 ENCOUNTER — Ambulatory Visit: Payer: PRIVATE HEALTH INSURANCE | Admitting: Physical Therapy

## 2015-04-20 ENCOUNTER — Encounter: Payer: Self-pay | Admitting: Physical Therapy

## 2015-04-20 DIAGNOSIS — R4189 Other symptoms and signs involving cognitive functions and awareness: Secondary | ICD-10-CM | POA: Diagnosis not present

## 2015-04-20 DIAGNOSIS — R2681 Unsteadiness on feet: Secondary | ICD-10-CM

## 2015-04-20 DIAGNOSIS — Z7409 Other reduced mobility: Secondary | ICD-10-CM

## 2015-04-20 DIAGNOSIS — R42 Dizziness and giddiness: Secondary | ICD-10-CM

## 2015-04-20 DIAGNOSIS — R269 Unspecified abnormalities of gait and mobility: Secondary | ICD-10-CM

## 2015-04-20 NOTE — Therapy (Signed)
[facility] [address] [place], [place], [address] Phone: [phone]   Fax:  [phone]  Occupational Therapy Treatment  Patient Details  Name: Jacqlyn Marolf MRN: [id number] Date of Birth: [birth year] No Data Recorded  Encounter Date: 04/20/2015      OT End of Session - 04/20/15 1744    Visit Number 5   Number of Visits 9   Date for OT Re-Evaluation 05/11/15   Authorization Type [employer]   OT Start Time 1535   OT Stop Time 1630   OT Time Calculation (min) 55 min   Activity Tolerance Patient tolerated treatment well   Behavior During Therapy [facility] for tasks assessed/performed      Past Medical History  Diagnosis Date  . Osteoporosis   . Blood clots in brain     d/t traumatic brain injury Aug 2016  . Traumatic brain injury [facility])     with loss of consciousness, intubated, at [facility] August 2016, closed, hemorrhage  . Anxiety   . Depression     behavioral health admission 2009 or 2010  . H/O suicide attempt 2009  . Bipolar disorder ([place])   . H/O transfusion of packed red blood cells   . Chronic kidney disease     follows up with Dr. Olivia Mackie: nephrologist in [place]. Pt cannot state what is wrong with her kidneys; just that she has issues with them. (numerous renal cysts on 01/2015 CT)    Past Surgical History  Procedure Laterality Date  . Cesarean section    . Rhinoplasty    . Septoplasty    . Dilation and curettage of uterus    . Colonoscopy w/ polypectomy    . Vocal cord surgery      granulomas removed after intubation d/t suicide attempt  . Microlaryngoscopy with co2 laser and excision of vocal cord lesion N/A 03/19/2015    Procedure: MICROLARYNGOSCOPY WITH CO2 LASER AND EXCISION OF VOCAL CORD LESION;  Surgeon: Melida Quitter, MD;  Location: [facility];  Service: ENT;  Laterality: N/A;  Micro Direct Laryngoscopy with co2 laser excision of bilateral vocal cord granulomas/jet venturi ventilation    There were no vitals  filed for this visit.  Visit Diagnosis:  Unsteadiness  Impaired cognition  Decreased functional mobility and endurance      Subjective Assessment - 04/20/15 1732    Subjective  They extended my FMLA.  I get to keep my job!   Pertinent History Bipolar, osteoporosis   Currently in Pain? No/denies   Pain Score 0-No pain                      OT Treatments/Exercises (OP) - 04/20/15 0001    ADLs   Home Maintenance Patient with significantly decreased activity tolerance upon OT evaluation.  Patient reliant on friends and family to complete meal prep, shopping, planning, laundry, simple cleaning.  Patient had both short and long term goals to improve activity tolerance and self organizational skills to help her return to household management.  Today patient able to clean and organize unfamiliar kitchen; squatting to floor to reach under cabinets, reaching overhead repeatedly without difficulty, bending to reach for several small items on floor, organiziing shelves and countertops.  Able to fold a large load of laundry while having meaningful conversation about returning to work, and driving.  Drastic improvement noted in frustration tolerance, problem identification, alternating attention, awareness, balance, and endurance in this session.  Patient reports managing mild behavioral issues with son  effectively now - homework assist, parenting in concert with ex-husband as prior to the injury.  Patient encouraged to continuously build activity into her day to help improve endurance overall.     ADL Comments Patient has met all OT goals well ahead of schedule.  She has returned to preparing some meals for her family, she was able to shop for three hours with her daughter, she has had meaningful and effective conversations with her work place to discuss timeline for return, she is engaging in social activities with friends again, she is able to [employer] task with familiar activities, e.g. cooking,  cleaning.  She agrees to OT discharge, and feels pleased with the progress she has made.                  OT Education - 04/20/15 1743    Education provided Yes   Education Details strategies for managing difficult or emotional conversations - set aside time to hold discussion, prepare talking points.  When returning to drive consider time of day, familiar easy route, limiting distractions (e.g. phone)   Person(s) Educated Patient   Methods Explanation   Comprehension Verbalized understanding;Returned demonstration          OT Short Term Goals - 04/20/15 1745    OT SHORT TERM GOAL #1   Title Patient will be independent with a home exercise program   Status Achieved  discussed increasing activity in home versus specific exercise   OT SHORT TERM GOAL #2   Title Patient will demostrate sufficient activity tolerance to fold one load of clothing without rest break.   Status Achieved   OT SHORT TERM GOAL #3   Title Patient will attend to organiztional task, e.g. placing appointments into calendar in smart phone, in minimally distracting environment with no greater than minimal cueing.   Status Achieved   OT SHORT TERM GOAL #4   Title Patient will prepare simple meal, completing two dishes simultaneously alternating her attention between these tasks with minimal cueing initially.   Status Achieved           OT Long Term Goals - 04/20/15 1746    OT LONG TERM GOAL #1   Title Patient will be independent in updated HEP   Status Achieved  Patient plans to return to prior exercise walking program using pedometer (fit bit) to objectively measure activity    OT LONG TERM GOAL #2   Title Patient will complete simple housework for 45 minutes without break longer than 5 min   Status Achieved   OT LONG TERM GOAL #3   Title Patient will actively divide her attention between two work related tasks in moderately distracting environment, e.g. typing specific text on computer, while  answering phone and directing caller   Status Achieved   OT LONG TERM GOAL #4   Title Patient will prepare a full hot meal safely navigating in kitchen and using stove top and or oven independently   Status Achieved               Plan - 04/20/15 1745    Clinical Impression Statement Patient has made rapid improvement, adn is prepared for OT discharge.   Pt will benefit from skilled therapeutic intervention in order to improve on the following deficits (Retired) Decreased coordination;Decreased activity tolerance;Decreased balance;Decreased cognition;Decreased endurance;Decreased mobility;Decreased strength;Decreased safety awareness;Impaired UE functional use;Impaired vision/preception;Improper body mechanics;Pain;Impaired flexibility;Decreased range of motion   Rehab Potential Good   OT Frequency 1x / week   OT  Duration 8 weeks   OT Treatment/Interventions Self-care/ADL training;Cryotherapy;Moist Heat;Therapeutic exercise;Neuromuscular education;DME and/or AE instruction;Therapist, nutritional;Therapeutic activities;Cognitive remediation/compensation;Visual/perceptual remediation/compensation;Patient/family education;Balance training   Plan Discharge from further OT at this time   Consulted and Agree with Plan of Care Patient        Problem List Patient Active Problem List   Diagnosis Date Noted  . Traumatic brain injury with loss of consciousness ([place]) 03/30/2015  . 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 Bipolar disorder ([place]) 03/30/2015  . 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 SAH (subarachnoid hemorrhage) ([place]) 01/31/2015   OCCUPATIONAL THERAPY DISCHARGE SUMMARY  Visits from Start of Care: 5  Current functional level related to goals / functional outcomes: Patient is effectively managing ADL/IADL with intermittent support from friends.  She intends to reduce support as endurance improves.     Remaining deficits: Left hand tremor, increased  emotional response (crying episodes) , divided attention  Education / Equipment: Steady increase in activity level as tolerated, preparation needed for emotionally charged discussions, importance of sleep/ rest alternate with activity, exercise - returning to walking program Plan: Patient agrees to discharge.  Patient goals were met. Patient is being discharged due to meeting the stated rehab goals.  ?????       Mariah Milling, OTR/L 04/20/2015, 5:48 PM  [facility] [address] [place], [place], [address] Phone: [phone]   Fax:  [phone]  Name: Alexus Galka MRN: [id number] Date of Birth: May 04, 1966

## 2015-04-21 NOTE — Therapy (Signed)
[facility] [address] [place], [place], [address] Phone: [phone]   Fax:  [phone]  Physical Therapy Treatment  Patient Details  Name: Laurie Booker MRN: [id number] Date of Birth: 12/24/1965 No Data Recorded  Encounter Date: 04/20/2015      PT End of Session - 04/21/15 1539    Visit Number 4   Number of Visits 9   Date for PT Re-Evaluation 05/11/15   Authorization Type [employer]    PT Start Time 1448   PT Stop Time 1530   PT Time Calculation (min) 42 min      Past Medical History  Diagnosis Date  . Osteoporosis   . Blood clots in brain     d/t traumatic brain injury Aug 2016  . Traumatic brain injury [facility]     with loss of consciousness, intubated, at [facility] August 2016, closed, hemorrhage  . Anxiety   . Depression     behavioral health admission 2009 or 2010  . H/O suicide attempt 2009  . Bipolar disorder ([facility])   . H/O transfusion of packed red blood cells   . Chronic kidney disease     follows up with Dr. Lequita Halt: nephrologist in [place]. Pt cannot state what is wrong with her kidneys; just that she has issues with them. (numerous renal cysts on 01/2015 CT)    Past Surgical History  Procedure Laterality Date  . Cesarean section    . Rhinoplasty    . Septoplasty    . Dilation and curettage of uterus    . Colonoscopy w/ polypectomy    . Vocal cord surgery      granulomas removed after intubation d/t suicide attempt  . Microlaryngoscopy with co2 laser and excision of vocal cord lesion N/A 03/19/2015    Procedure: MICROLARYNGOSCOPY WITH CO2 LASER AND EXCISION OF VOCAL CORD LESION;  Surgeon: Christia Reading, MD;  Location: [facility];  Service: ENT;  Laterality: N/A;  Micro Direct Laryngoscopy with co2 laser excision of bilateral vocal cord granulomas/jet venturi ventilation    There were no vitals filed for this visit.  Visit Diagnosis:  Dizziness and giddiness  Abnormality of gait  Unsteadiness       Subjective Assessment - 04/20/15 1449    Subjective Pt. c/o decreased balance/ dysequilbrium and feeling "jittery"; pt uses cane when she goes outside her home - takes it for comfort and security   Patient Stated Goals To improve balance, equilibrium and return to work.    Currently in Pain? No/denies  No headache today                Vestibular Assessment - 04/21/15 0001    Balancemaster   Community education officer Comment composite score 65/100 with N=70/100; somatosensory and vestibular inputs WNL's iwth visual input decreased at approx.60/100 wtih N=73/100    Discussed and explained results of this test to pt. - she verbalized understanding  SOT - no falls occurred on any trials - all 3 trials on condition 4 below N; trials 1 & 3 on condition 5 and trial 1 on condition 6 below N     Pt performed standing on compliant surface (grey foam) in corner with head turns up/down and side to side x 10 reps each with feet Apart and feet together - recommended these exercises for HEP              Balance Exercises - 04/21/15 1535    Balance Exercises: Standing  Standing Eyes Opened Narrow base of support (BOS);Wide (BOA);Head turns;Foam/compliant surface   Standing Eyes Closed Wide (BOA);Narrow base of support (BOS);Head turns;Foam/compliant surface;5 reps  targets horizontally and vertically with EO - CGA for safety           PT Education - 04/21/15 1536    Education provided Yes   Education Details Emphasized importance of gaze stabilization (x1 viewing) exercise in standing to increase visual input in maintaining balance; pt states she is not doing this exercise at all at home due to results produced with exercise   also recommended practice standing on compliant surface with Eo/EC for incr. vestibular input - pt declined handout with pics   Person(s) Educated Patient   Methods Explanation;Demonstration   Comprehension Verbalized  understanding;Returned demonstration          PT Short Term Goals - 03/12/15 1507    PT SHORT TERM GOAL #1   Title Pt will perform 6MWT in order to assess functional endurance.  (Target Date: 04/09/15)   Baseline Not performed due to time contraint   Time 4   Period Weeks   PT SHORT TERM GOAL #2   Title Pt will improve gait speed to 2.78 ft/sec to indicate decreased fall risk and improved efficiency of gait.  (Target Date: 04/09/15)   Baseline 2.18 on 03/12/15   Time 4   Period Weeks   PT SHORT TERM GOAL #3   Title Pt will increase cognitive TUG score to <15 secs in order to indicate functional improvement in balance with dual tasking.   (Target Date: 04/09/15)   Baseline 15.28 secs on 03/12/15   Time 4   Period Weeks   PT SHORT TERM GOAL #4   Title Pt will initiate HEP for strength, balance and flexibility in order to indicate improvements in functional mobility and decreased fall risk.   (Target Date: 04/09/15)   Baseline No HEP initiated yet   Time 4   Period Weeks   PT SHORT TERM GOAL #5   Title Pt will ambulate >300' on indoor surfaces while negotiating obstacles without AD at S level to indicate safe negotiation in home.   (Target Date: 04/09/15)   Baseline S with use of cane per pt report, limited community ambulation.    Time 4   Period Weeks           PT Long Term Goals - 03/12/15 1515    PT LONG TERM GOAL #1   Title Pt will independently perform HEP for balance, strength and flexibility to indicate compliance towards improved balance and functional mobility. (Target 05/07/15)   Baseline dependent for HEP 03/12/15   Time 8   Period Weeks   PT LONG TERM GOAL #2   Title Pt will verbalize fall prevention strategies to indicate safe mobliity and negotiation in home and community.  (Target 05/07/15)   Baseline Pt dependent in naming strategies (03/12/15)   Time 8   Period Weeks   PT LONG TERM GOAL #3   Title Pt will ambulate >300' on outdoor surfaces (including  grass, ramp, curb) w/o AD at mod I level to indicate safe negoation in community. (Target 05/07/15)   Baseline Limited community ambulation with cane and S.    Time 8   Period Weeks   PT LONG TERM GOAL #4   Title Pt will increase 6MWT by 200' in order to indicate improvement in functional endurance.  (Target Date: 05/07/15)   Baseline not yet performed    Time  8   Period Weeks   PT LONG TERM GOAL #5   Title Pt will demonstrate ability to dual task x 5 mins without AD at mod I level to indicate safe return to work.  (Target Date: 05/07/15)   Baseline unable at this time   Time 8   Period Weeks               Plan - 04/21/15 1616    Clinical Impression Statement Pt has minimal to moderate decrease in visual input in maintaining balance per SOT with N somatosensory and vestibular inputs; pt reports feeling of dysequilibrium and unsteadiness, but reports some noncompliance with HEP - strongly recommended that pt perform x1 viewing exercise and blance on foam exercises with EO and EC to incr. vestibular input and feelings of steadiness with mobility   Pt will benefit from skilled therapeutic intervention in order to improve on the following deficits Abnormal gait;Decreased activity tolerance;Decreased balance;Decreased cognition;Decreased endurance;Decreased knowledge of precautions;Decreased mobility;Decreased safety awareness;Decreased strength;Difficulty walking;Dizziness;Impaired perceived functional ability;Impaired flexibility;Impaired UE functional use;Postural dysfunction;Improper body mechanics;Pain;Impaired vision/preception   Rehab Potential Good   PT Frequency 1x / week   PT Duration 8 weeks   PT Treatment/Interventions ADLs/Self Care Home Management;Gait training;DME Instruction;Stair training;Functional mobility training;Therapeutic activities;Therapeutic exercise;Neuromuscular re-education;Balance training;Cognitive remediation;Patient/family education;Manual techniques;Energy  conservation;Vestibular;Visual/perceptual remediation/compensation   PT Next Visit Plan vestibular/dynamic gait activities - trying tossing ball with walking with cognitive task incorporated   PT Home Exercise Plan x1 viewing and balance on foam   Consulted and Agree with Plan of Care Patient        Problem List Patient Active Problem List   Diagnosis Date Noted  . Traumatic brain injury with loss of consciousness ([facility]) 03/30/2015  . 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 Bipolar disorder ([facility]) 03/30/2015  . 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 SAH (subarachnoid hemorrhage) ([facility]) 01/31/2015    Eyonna Sandstrom, Donavan Burnet, PT 04/21/2015, 4:25 PM  [facility] [address] [place], [place], [address] Phone: [phone]   Fax:  [phone]  Name: Laurie Booker MRN: [id number] Date of Birth: 1966-04-28

## 2015-04-23 ENCOUNTER — Ambulatory Visit: Payer: PRIVATE HEALTH INSURANCE | Admitting: Physical Therapy

## 2015-04-23 ENCOUNTER — Ambulatory Visit: Payer: PRIVATE HEALTH INSURANCE

## 2015-04-27 ENCOUNTER — Encounter: Payer: PRIVATE HEALTH INSURANCE | Admitting: Occupational Therapy

## 2015-04-27 ENCOUNTER — Ambulatory Visit: Payer: PRIVATE HEALTH INSURANCE | Admitting: Physical Therapy

## 2015-05-03 ENCOUNTER — Encounter: Payer: PRIVATE HEALTH INSURANCE | Admitting: Occupational Therapy

## 2015-05-04 ENCOUNTER — Encounter: Payer: Self-pay | Admitting: Physical Medicine & Rehabilitation

## 2015-05-04 ENCOUNTER — Encounter
Payer: PRIVATE HEALTH INSURANCE | Attending: Physical Medicine & Rehabilitation | Admitting: Physical Medicine & Rehabilitation

## 2015-05-04 VITALS — BP 124/76 | HR 66

## 2015-05-04 DIAGNOSIS — Z5181 Encounter for therapeutic drug level monitoring: Secondary | ICD-10-CM | POA: Insufficient documentation

## 2015-05-04 DIAGNOSIS — F3161 Bipolar disorder, current episode mixed, mild: Secondary | ICD-10-CM | POA: Insufficient documentation

## 2015-05-04 DIAGNOSIS — I669 Occlusion and stenosis of unspecified cerebral artery: Secondary | ICD-10-CM | POA: Insufficient documentation

## 2015-05-04 DIAGNOSIS — M810 Age-related osteoporosis without current pathological fracture: Secondary | ICD-10-CM | POA: Insufficient documentation

## 2015-05-04 DIAGNOSIS — S069X0S Unspecified intracranial injury without loss of consciousness, sequela: Secondary | ICD-10-CM | POA: Diagnosis not present

## 2015-05-04 DIAGNOSIS — S069X2S Unspecified intracranial injury with loss of consciousness of 31 minutes to 59 minutes, sequela: Secondary | ICD-10-CM | POA: Insufficient documentation

## 2015-05-04 DIAGNOSIS — F419 Anxiety disorder, unspecified: Secondary | ICD-10-CM | POA: Diagnosis not present

## 2015-05-04 DIAGNOSIS — Z79899 Other long term (current) drug therapy: Secondary | ICD-10-CM | POA: Diagnosis not present

## 2015-05-04 DIAGNOSIS — S069X3S Unspecified intracranial injury with loss of consciousness of 1 hour to 5 hours 59 minutes, sequela: Secondary | ICD-10-CM

## 2015-05-04 DIAGNOSIS — F3170 Bipolar disorder, currently in remission, most recent episode unspecified: Secondary | ICD-10-CM

## 2015-05-04 DIAGNOSIS — N189 Chronic kidney disease, unspecified: Secondary | ICD-10-CM | POA: Insufficient documentation

## 2015-05-04 DIAGNOSIS — S069XAS Unspecified intracranial injury with loss of consciousness status unknown, sequela: Secondary | ICD-10-CM

## 2015-05-04 DIAGNOSIS — G44309 Post-traumatic headache, unspecified, not intractable: Secondary | ICD-10-CM | POA: Diagnosis not present

## 2015-05-04 MED ORDER — TRAZODONE HCL 100 MG PO TABS: 100.0000 mg | ORAL_TABLET | Freq: Every day | ORAL | Status: DC

## 2015-05-04 MED ORDER — TRAMADOL HCL 50 MG PO TABS: 50.0000 mg | ORAL_TABLET | Freq: Two times a day (BID) | ORAL | Status: DC | PRN

## 2015-05-04 NOTE — Progress Notes (Signed)
Subjective:    Patient ID: Laurie Booker, female    DOB: 11/22/1965, 49 y.o.   MRN: [id number]  HPI   Laurie Booker is here in follow up of her TBI and associated symptoms. She has experienced some improvement per therapy reports. She has experienced 2 flu-like illnesses over the last few weeks which she feels have set her back somewhat. She has had more problems with her balance and has had noticed an increase in tremors even involving the right hand over the last 3-4 days.   We started on propranolol for tremors with some improvement of her tremors despite the taste. We also started trazodone 50mg  qhs which didn't have a great effect.   She went back to [facility] to see her established psychiatrist as she had a very solid relationship with him. She typically sees him once per year, despite the accident.         Pain Inventory Average Pain 6 Pain Right Now 7 My pain is intermittent, sharp and aching  In the last 24 hours, has pain interfered with the following? General activity 6 Relation with others 6 Enjoyment of life 6 What TIME of day is your pain at its worst? Morning and Evening Sleep (in general) Poor  Pain is worse with: bending and some activites Pain improves with: medication Relief from Meds: 4  Mobility walk without assistance use a cane how many minutes can you walk? 3 ability to climb steps?  yes do you drive?  no Do you have any goals in this area?  yes  Function disabled: date disabled 08/05 I need assistance with the following:  meal prep, household duties and shopping Do you have any goals in this area?  yes  Neuro/Psych tremor confusion depression anxiety  Prior Studies Any changes since last visit?  yes  Physicians involved in your care Any changes since last visit?  yes   History reviewed. No pertinent family history. Social History   Social History  . Marital Status: Legally Separated    Spouse Name: N/A  . Number of Children: N/A  .  Years of Education: N/A   Social History Main Topics  . Smoking status: Never Smoker   . Smokeless tobacco: None  . Alcohol Use: Yes     Comment: socially  . Drug Use: None  . Sexual Activity: Not Asked   Other Topics Concern  . None   Social History Narrative   Past Surgical History  Procedure Laterality Date  . Cesarean section    . Rhinoplasty    . Septoplasty    . Dilation and curettage of uterus    . Colonoscopy w/ polypectomy    . Vocal cord surgery      granulomas removed after intubation d/t suicide attempt  . Microlaryngoscopy with co2 laser and excision of vocal cord lesion N/A 03/19/2015    Procedure: MICROLARYNGOSCOPY WITH CO2 LASER AND EXCISION OF VOCAL CORD LESION;  Surgeon: Christia Readingwight Bates, MD;  [facility];  Service: ENT;  Laterality: N/A;  Micro Direct Laryngoscopy with co2 laser excision of bilateral vocal cord granulomas/jet venturi ventilation   Past Medical History  Diagnosis Date  . Osteoporosis   . Blood clots in brain     d/t traumatic brain injury Aug 2016  . Traumatic brain injury [facility](HCC)     with loss of consciousness, intubated, at [facility] August 2016, closed, hemorrhage  . Anxiety   . Depression     behavioral health admission 2009 or 2010  .  H/O suicide attempt 2009  . Bipolar disorder (HCC)   . H/O transfusion of packed red blood cells   . Chronic kidney disease     follows up with Dr. Lequita Halt: nephrologist in [place]. Pt cannot state what is wrong with her kidneys; just that she has issues with them. (numerous renal cysts on 01/2015 CT)   BP 124/76 mmHg  Pulse 66  SpO2 99%  LMP  (LMP Unknown)  Opioid Risk Score:   Fall Risk Score:  `1  Depression screen PHQ 2/9  Depression screen PHQ 2/9 03/30/2015  Decreased Interest 1  Down, Depressed, Hopeless 2  PHQ - 2 Score 3  Altered sleeping 2  Tired, decreased energy 2  Change in appetite 1  Feeling bad or failure about yourself  2  Trouble concentrating 2  Moving slowly or fidgety/restless 2   Suicidal thoughts 1  PHQ-9 Score 15  Difficult doing work/chores Very difficult     Review of Systems  Constitutional: Positive for appetite change.  Respiratory: Positive for cough.   Neurological: Positive for tremors.  Psychiatric/Behavioral: Positive for confusion. The patient is nervous/anxious.        Depression  All other systems reviewed and are negative.      Objective:   Physical Exam  General: Alert and oriented x 3, No apparent distress.  HEENT: Head is normocephalic, atraumatic, PERRLA, EOMI, sclera anicteric, oral mucosa pink and moist, dentition intact, ext ear canals clear,  Neck: Supple without JVD or lymphadenopathy  Heart: Reg rate and rhythm. No murmurs rubs or gallops  Chest: CTA bilaterally without wheezes, rales, or rhonchi; no distress  Abdomen: Soft, non-tender, non-distended, bowel sounds positive.  Extremities: No clubbing, cyanosis, or edema. Pulses are 2+  Skin: Clean and intact without signs of breakdown  Neuro: Oriented to date, place, reason. IMPROVED attention and focus. Still needed cues to stay on task at times but much better. Less anxious. Good insight anda awareness. did not see any frank nystagmus.  Cranial nerves 2-12 are intact. Sensory exam is normal. Reflexes are 2+ in all 4's. Fine motor coordination is intact. No tremors. Motor function is grossly 5/5. Balance better with cane.Fine, mild tremor LUE with voluntary movements but right UE also involved now.  Musculoskeletal: Full ROM, No pain with AROM or PROM in the neck, trunk, or extremities. Posture appropriate  Psych: Pt's affect is pleasant and anxious. She was cooperative. No aggressive behavior was seen. She was non-irritable.   Assessment & Plan:   Assessment:  1. Traumatic brain injury 01/31/15 after fall --SAH, Right frontal contusion, Right middle cranial fossa EDH  -pt with subsequent cognitive/behavioral deficits post injury  -showing improvement despite head cold sx 2. Post  traumatic headaches  3. Hx of bipolar disorder.  Plan:  1. Continue outpatient therapies, PT,  SLP with the focus on vestibular and cognitive deficits. She will need formal neuropsych testing at some point before returning to work. I would not project her returning to work before January 2017.  2. Continue lamictal, lithium, xanax for her bipolar disorder as she's been doing.  3. Sleep goal should be 8-10 hours per night. Increase trazodone to  qhs.  4. For mood stabilization, headaches, and mild left sided tremor, continue propranolol, increase to  qhs. Could consider another anticonvulsant for her mood, but considering she's already on lamictal, I felt it was better to go another direction at this time.  5. Considering her poor attention and distractibility, we discussed utilizing different organizational tactics,  taking notes, working on tasks one at a time, having quiet work spaces, etc. Will hold off on a stimulant at this time. Consider ritalin depending upon course. 6. Follow up with me in about a month. 30 minutes of face to face patient care time were spent during this visit. All questions were encouraged and answered.  I   Her psychiatrist in [place]: Raeford Razor, MD, pHD [phone]

## 2015-05-04 NOTE — Patient Instructions (Signed)
MAKE SURE YOU'RE DRINKING PLENTY OF WATER.  IF THE INCREASE IN TRAZODONE DOESN'T HELP YOUR SLEEP, CALL MY OFFICE.----GOAL OF 8-10 HOURS PER NIGHT.

## 2015-05-07 ENCOUNTER — Ambulatory Visit: Payer: PRIVATE HEALTH INSURANCE | Attending: Neurosurgery | Admitting: Rehabilitation

## 2015-05-07 ENCOUNTER — Encounter: Payer: Self-pay | Admitting: Rehabilitation

## 2015-05-07 ENCOUNTER — Ambulatory Visit: Payer: PRIVATE HEALTH INSURANCE

## 2015-05-07 DIAGNOSIS — R42 Dizziness and giddiness: Secondary | ICD-10-CM

## 2015-05-07 DIAGNOSIS — R41841 Cognitive communication deficit: Secondary | ICD-10-CM

## 2015-05-07 DIAGNOSIS — R269 Unspecified abnormalities of gait and mobility: Secondary | ICD-10-CM

## 2015-05-07 DIAGNOSIS — R2681 Unsteadiness on feet: Secondary | ICD-10-CM | POA: Insufficient documentation

## 2015-05-07 NOTE — Therapy (Signed)
[facility] [address] [place], [place], [address] Phone: [phone]   Fax:  [phone]  Speech Language Pathology Treatment  Patient Details  Name: Laurie Booker MRN: [id number] Date of Birth: 27-Jan-1966 No Data Recorded  Encounter Date: 05/07/2015      End of Session - 05/07/15 1542    Visit Number 5   Number of Visits 17   Date for SLP Re-Evaluation 05/26/15   SLP Start Time 1449   SLP Stop Time  1532   SLP Time Calculation (min) 43 min   Activity Tolerance Patient tolerated treatment well      Past Medical History  Diagnosis Date  . Osteoporosis   . Blood clots in brain     d/t traumatic brain injury Aug 2016  . Traumatic brain injury [facility])     with loss of consciousness, intubated, at [facility] August 2016, closed, hemorrhage  . Anxiety   . Depression     behavioral health admission 2009 or 2010  . H/O suicide attempt 2009  . Bipolar disorder (HCC)   . H/O transfusion of packed red blood cells   . Chronic kidney disease     follows up with Dr. Lequita Halt: nephrologist in [place]. Pt cannot state what is wrong with her kidneys; just that she has issues with them. (numerous renal cysts on 01/2015 CT)    Past Surgical History  Procedure Laterality Date  . Cesarean section    . Rhinoplasty    . Septoplasty    . Dilation and curettage of uterus    . Colonoscopy w/ polypectomy    . Vocal cord surgery      granulomas removed after intubation d/t suicide attempt  . Microlaryngoscopy with co2 laser and excision of vocal cord lesion N/A 03/19/2015    Procedure: MICROLARYNGOSCOPY WITH CO2 LASER AND EXCISION OF VOCAL CORD LESION;  Surgeon: Christia Reading, MD;  Location: [facility] OR;  Service: ENT;  Laterality: N/A;  Micro Direct Laryngoscopy with co2 laser excision of bilateral vocal cord granulomas/jet venturi ventilation    There were no vitals filed for this visit.  Visit Diagnosis: Cognitive communication deficit       Subjective Assessment - 05/07/15 1456    Subjective Pt handed SLP homework "I don't know who gave this to me but here it is."               ADULT SLP TREATMENT - 05/07/15 1503    General Information   Behavior/Cognition Alert;Cooperative;Pleasant mood   Treatment Provided   Treatment provided Cognitive-Linquistic   Pain Assessment   Pain Assessment No/denies pain   Cognitive-Linquistic Treatment   Treatment focused on Cognition   Skilled Treatment SLP  worked with pt on her attention skills today, as she reported they have worsened since she returned from [place]. Pt alternated attention between conversation and written task (detailed) with 100% success, but did not demo dvided attention when told to do so if possible. Pt appeared tangential with conversation with SLP questions approx 20% of the time.Pt wrote "no TV" on teh homework without the TV needed.   Assessment / Recommendations / Plan   Plan Continue with current plan of care   Progression Toward Goals   Progression toward goals Progressing toward goals            SLP Short Term Goals - 05/07/15 1545    SLP SHORT TERM GOAL #1   Title pt will demo alternating attention during mod complex cognitive-linguistic tasks  in ST room with modified independence (pt checking for errors)   Baseline WNL in simple tasks    Time 2   Period Weeks   Status Achieved   SLP SHORT TERM GOAL #2   Title pt will tell SLP 6 memory strategies/compensations   Baseline none provided yet   Time 2   Period Weeks   Status On-going   SLP SHORT TERM GOAL #3   Title pt will demo emergent awareness and selective attention adequate to quell verbosity during therapy tasks with rare nonverbal cues   Time 2   Period Weeks   Status On-going          SLP Long Term Goals - 05/07/15 1546    SLP LONG TERM GOAL #1   Title pt will utilize memory strategies during 4 therapy sessions   Baseline none demonstrated   Time 6   Period Weeks  or 12  more visits   Status On-going   SLP LONG TERM GOAL #2   Title pt will demo alternating attention in a mod distracting environment with modified independence (pt will self-correct/double check)   Baseline WNL alternating attention for simple tasks in quiet environment   Time 6   Period Weeks   Status On-going   SLP LONG TERM GOAL #3   Title pt will demo emergent awareness, and attention appropriate for witholding extraneous conversation during therapy tasks, independently   Time 6   Period Weeks   Status On-going          Plan - 05/07/15 1543    Clinical Impression Statement Alternating attention tasks appeared WNL. Pt was unable to complete organization task with divided attention. Continue skilled ST to maximize cognitive-linguistic skills for possible return to work.   Speech Therapy Frequency 2x / week   Duration --  6 weeks, or 12 more visits   Treatment/Interventions Internal/external aids;Compensatory strategies;Cognitive reorganization;SLP instruction and feedback;Patient/family education;Functional tasks;Cueing hierarchy   Potential to Achieve Goals Good        Problem List Patient Active Problem List   Diagnosis Date Noted  . Traumatic brain injury with loss of consciousness (HCC) 03/30/2015  . 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 Bipolar disorder (HCC) 03/30/2015  . 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 SAH (subarachnoid hemorrhage) (HCC) 01/31/2015    [facility],Luverne Zerkle , MS, CCC-SLP  05/07/2015, 3:47 PM  [facility] [address] [place], [place], [address] Phone: [phone]   Fax:  [phone]   Name: Governor SpeckingMary Sze MRN: [id number] Date of Birth: 10/07/1965

## 2015-05-07 NOTE — Patient Instructions (Signed)
  Please complete the assigned speech therapy homework and return it to your next session. (work on the "TV" tasks with TV on and keep track of plot, those without "TV", no TV is needed)

## 2015-05-07 NOTE — Therapy (Signed)
[facility] [address] [place], [place], [address] Phone: [phone]   Fax:  [phone]  Physical Therapy Treatment and D/C Summary  Patient Details  Name: Laurie Booker MRN: [id number] Date of Birth: 1966-02-20 No Data Recorded  Encounter Date: 05/07/2015      PT End of Session - 05/07/15 1621    Visit Number 5   Number of Visits 9   Date for PT Re-Evaluation 05/11/15   Authorization Type [employer]    PT Start Time 8588   PT Stop Time 1445   PT Time Calculation (min) 43 min   Activity Tolerance Patient tolerated treatment well   Behavior During Therapy [facility] for tasks assessed/performed      Past Medical History  Diagnosis Date  . Osteoporosis   . Blood clots in brain     d/t traumatic brain injury Aug 2016  . Traumatic brain injury [facility])     with loss of consciousness, intubated, at [facility] August 2016, closed, hemorrhage  . Anxiety   . Depression     behavioral health admission 2009 or 2010  . H/O suicide attempt 2009  . Bipolar disorder ([place])   . H/O transfusion of packed red blood cells   . Chronic kidney disease     follows up with Dr. Olivia Mackie: nephrologist in [place]. Pt cannot state what is wrong with her kidneys; just that she has issues with them. (numerous renal cysts on 01/2015 CT)    Past Surgical History  Procedure Laterality Date  . Cesarean section    . Rhinoplasty    . Septoplasty    . Dilation and curettage of uterus    . Colonoscopy w/ polypectomy    . Vocal cord surgery      granulomas removed after intubation d/t suicide attempt  . Microlaryngoscopy with co2 laser and excision of vocal cord lesion N/A 03/19/2015    Procedure: MICROLARYNGOSCOPY WITH CO2 LASER AND EXCISION OF VOCAL CORD LESION;  Surgeon: Melida Quitter, MD;  Location: [place];  Service: ENT;  Laterality: N/A;  Micro Direct Laryngoscopy with co2 laser excision of bilateral vocal cord granulomas/jet venturi ventilation    There  were no vitals filed for this visit.  Visit Diagnosis:  Abnormality of gait  Unsteadiness  Dizziness and giddiness      Subjective Assessment - 05/07/15 1405    Subjective "I was doing good until about a week and a half ago."  "I've fallen 4 times since I've been in therapy."     Limitations House hold activities;Walking   Patient Stated Goals To improve balance, equilibrium and return to work.    Currently in Pain? No/denies            Self Care:  Lengthy discussion regarding progress with therapy, problem solving through changes she has made at home, being cautious and fall prevention strategies at home.  Also discussed pt becoming more active in community and increasing endurance as able.  Also feel that she would benefit from continued vestibular exercises, however pt declined handout on exercises.    TE:  Performed 6MWT with distance of 886', indicative of poor functional endurance, esp for age matched adults.  Continue to educate on importance of slowing increased endurance.   Gait:  Performed >500' on outdoor uneven, paved, inclines, declines and grass/gravel all at mod I level without use of cane.  No signs of LOB or unsteadiness during gait assessment.  PT Education - 05/07/15 1620    Education provided Yes   Education Details Continue to emphasize importance of corner balance tasks esp vestibular exercises.  Also continue to ecourage her to increase her endurance and activity tolerance.     Person(s) Educated Patient   Methods Explanation   Comprehension Verbalized understanding          PT Short Term Goals - 03/12/15 1507    PT SHORT TERM GOAL #1   Title Pt will perform 6MWT in order to assess functional endurance.  (Target Date: 04/09/15)   Baseline Not performed due to time contraint   Time 4   Period Weeks   PT SHORT TERM GOAL #2   Title Pt will improve gait speed to 2.78 ft/sec to indicate decreased fall risk and  improved efficiency of gait.  (Target Date: 04/09/15)   Baseline 2.18 on 03/12/15   Time 4   Period Weeks   PT SHORT TERM GOAL #3   Title Pt will increase cognitive TUG score to <15 secs in order to indicate functional improvement in balance with dual tasking.   (Target Date: 04/09/15)   Baseline 15.28 secs on 03/12/15   Time 4   Period Weeks   PT SHORT TERM GOAL #4   Title Pt will initiate HEP for strength, balance and flexibility in order to indicate improvements in functional mobility and decreased fall risk.   (Target Date: 04/09/15)   Baseline No HEP initiated yet   Time 4   Period Weeks   PT SHORT TERM GOAL #5   Title Pt will ambulate >300' on indoor surfaces while negotiating obstacles without AD at S level to indicate safe negotiation in home.   (Target Date: 04/09/15)   Baseline S with use of cane per pt report, limited community ambulation.    Time 4   Period Weeks           PT Long Term Goals - 05/07/15 1414    PT LONG TERM GOAL #1   Title Pt will independently perform HEP for balance, strength and flexibility to indicate compliance towards improved balance and functional mobility. (Target 05/07/15)   Baseline met 05/07/15   Time 8   Period Weeks   Status Achieved   PT LONG TERM GOAL #2   Title Pt will verbalize fall prevention strategies to indicate safe mobliity and negotiation in home and community.  (Target 05/07/15)   Baseline met 05/07/15   Time 8   Period Weeks   Status Achieved   PT LONG TERM GOAL #3   Title Pt will ambulate >300' on outdoor surfaces (including grass, ramp, curb) w/o AD at mod I level to indicate safe negoation in community. (Target 05/07/15)   Baseline 05/07/15 met    Time 8   Period Weeks   Status Achieved   PT LONG TERM GOAL #4   Title Pt will increase 6MWT by 200' in order to indicate improvement in functional endurance.  (Target Date: 05/07/15)   Baseline 886' on 05/07/15   Time 8   Period Weeks   Status Not Met   PT LONG TERM  GOAL #5   Title Pt will demonstrate ability to dual task x 5 mins without AD at mod I level to indicate safe return to work.  (Target Date: 05/07/15)   Baseline met 05/07/15   Time 8   Period Weeks   Status Achieved  Plan - 05/07/15 1621    Clinical Impression Statement Skilled session focused on assessment of LTG's for wrap up and D/C from therapy.  Feel that pt has progressed with mobility and improved ability to dual task and function safely at home and community.  Pt very cautious about her mobility and at times still uses cane, however she was not unsteady on outdoor sufaces.  Also note that she continues to refuse handouts on vestibular adaptation exercises, therefore continue to educate on performing ones given out in previous sessions.    Pt will benefit from skilled therapeutic intervention in order to improve on the following deficits Abnormal gait;Decreased activity tolerance;Decreased balance;Decreased cognition;Decreased endurance;Decreased knowledge of precautions;Decreased mobility;Decreased safety awareness;Decreased strength;Difficulty walking;Dizziness;Impaired perceived functional ability;Impaired flexibility;Impaired UE functional use;Postural dysfunction;Improper body mechanics;Pain;Impaired vision/preception   Rehab Potential Good   Clinical Impairments Affecting Rehab Potential insurance, transportation   PT Frequency 1x / week   PT Duration 8 weeks   PT Treatment/Interventions ADLs/Self Care Home Management;Gait training;DME Instruction;Stair training;Functional mobility training;Therapeutic activities;Therapeutic exercise;Neuromuscular re-education;Balance training;Cognitive remediation;Patient/family education;Manual techniques;Energy conservation;Vestibular;Visual/perceptual remediation/compensation   PT Next Visit Plan n/a   PT Home Exercise Plan --   Consulted and Agree with Plan of Care Patient        PHYSICAL THERAPY DISCHARGE  SUMMARY  Visits from Start of Care: 5   Current functional level related to goals / functional outcomes: See LTG's   Remaining deficits: Visual vestibular deficits, however pt has exercises to perform at home and was given recommendations on exercises that she did not want handouts for.    Education / Equipment: HEP  Plan: Patient agrees to discharge.  Patient goals were met. Patient is being discharged due to meeting the stated rehab goals.  ?????Did not meet 6MWT as STG's were not assessed due to lack of attendance.         Problem List Patient Active Problem List   Diagnosis Date Noted  . Traumatic brain injury with loss of consciousness ([place]) 03/30/2015  . Headache as late effect of brain injury ([place]) 03/30/2015  . Bipolar disorder ([place]) 03/30/2015  . 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 SAH (subarachnoid hemorrhage) ([place]) 01/31/2015    Cameron Sprang, PT, MPT [facility] [address] [place], [place], [address] Phone: [phone]   Fax:  [phone] 05/07/2015, 4:26 PM  Name: Laurie Booker MRN: [id number] Date of Birth: Nov 27, 1965

## 2015-05-11 ENCOUNTER — Telehealth: Payer: Self-pay

## 2015-05-11 NOTE — Telephone Encounter (Signed)
Recommend checking a lithium level given her symptoms.  When was the last time she had a level checked?

## 2015-05-11 NOTE — Telephone Encounter (Signed)
Patient called in today and stated that she was told to call in and report her tremors. She states that her tremors have worsened and she has been very unsteady on her feet. She states that she is unable to recall her long time memory. She states that she cannot even remember her children growing up. She states that she is also crying uncontrollably at times and has fallen once in the last week. Please advise. Thanks

## 2015-05-12 NOTE — Telephone Encounter (Signed)
Spoke with patient. Patient did not increase her propranolol which was stated to do in her last OV note. Patient is going to have blood work done tomorrow and have her lithium level checked then. Thanks

## 2015-05-13 ENCOUNTER — Encounter: Payer: PRIVATE HEALTH INSURANCE | Admitting: Occupational Therapy

## 2015-05-13 ENCOUNTER — Ambulatory Visit: Payer: PRIVATE HEALTH INSURANCE

## 2015-05-13 ENCOUNTER — Ambulatory Visit: Payer: PRIVATE HEALTH INSURANCE | Admitting: Rehabilitation

## 2015-05-13 DIAGNOSIS — R41841 Cognitive communication deficit: Secondary | ICD-10-CM

## 2015-05-13 DIAGNOSIS — R269 Unspecified abnormalities of gait and mobility: Secondary | ICD-10-CM | POA: Diagnosis not present

## 2015-05-13 NOTE — Therapy (Signed)
[facility] [address] [place], [place], [address] Phone: [phone]   Fax:  [phone]  Speech Language Pathology Treatment  Patient Details  Name: Laurie SpeckingMary Booker MRN: [id number] Date of Birth: 06/27/1965 No Data Recorded  Encounter Date: 05/13/2015      End of Session - 05/13/15 1446    Visit Number 6   Number of Visits 17   Date for SLP Re-Evaluation 05/26/15   SLP Start Time 1402   SLP Stop Time  1446   SLP Time Calculation (min) 44 min   Activity Tolerance Patient tolerated treatment well      Past Medical History  Diagnosis Date  . Osteoporosis   . Blood clots in brain     d/t traumatic brain injury Aug 2016  . Traumatic brain injury [facility](HCC)     with loss of consciousness, [facility], at [facility] August [facility], closed, hemorrhage  . Anxiety   . Depression     behavioral health admission 2009 or 2010  . H/O suicide attempt 2009  . Bipolar disorder (HCC)   . H/O transfusion of packed red blood cells   . Chronic kidney disease     follows up with Dr. Lequita HaltLufadeju: nephrologist in [place]. Pt cannot state what is wrong with her kidneys; just that she has issues with them. (numerous renal cysts on 01/2015 CT)    Past Surgical History  Procedure Laterality Date  . Cesarean section    . Rhinoplasty    . Septoplasty    . Dilation and curettage of uterus    . Colonoscopy w/ polypectomy    . Vocal cord surgery      granulomas removed after intubation d/t suicide attempt  . Microlaryngoscopy with co2 laser and excision of vocal cord lesion N/A 03/19/2015    Procedure: MICROLARYNGOSCOPY WITH CO2 LASER AND EXCISION OF VOCAL CORD LESION;  Surgeon: Christia Readingwight Bates, MD;  Location: [facility] OR;  Service: ENT;  Laterality: N/A;  Micro Direct Laryngoscopy with co2 laser excision of bilateral vocal cord granulomas/jet venturi ventilation    There were no vitals filed for this visit.  Visit Diagnosis: Cognitive communication deficit       Subjective Assessment - 05/13/15 1403    Subjective "I actually rememebered to do this crap." (pt, re: homework)               ADULT SLP TREATMENT - 05/13/15 1403    General Information   Behavior/Cognition Alert;Cooperative;Pleasant mood   Treatment Provided   Treatment provided Cognitive-Linquistic   Pain Assessment   Pain Assessment No/denies pain   Cognitive-Linquistic Treatment   Treatment focused on Cognition   Skilled Treatment SLP req'd to provide pt min cues occasionally in simple detailed tasks for her homework. With divided attention in other simple detailed tasks pt had 87% and 50% success on divided attention tasks.    Assessment / Recommendations / Plan   Plan Continue with current plan of care   Progression Toward Goals   Progression toward goals Progressing toward goals            SLP Short Term Goals - 05/13/15 1449    SLP SHORT TERM GOAL #1   Title pt will demo alternating attention during mod complex cognitive-linguistic tasks in ST room with modified independence (pt checking for errors)   Baseline WNL in simple tasks    Time 1   Period Weeks   Status Achieved   SLP SHORT TERM GOAL #2   Title pt  will tell SLP 6 memory strategies/compensations   Baseline none provided yet   Time 1   Period Weeks   Status On-going   SLP SHORT TERM GOAL #3   Title pt will demo emergent awareness and selective attention adequate to quell verbosity during therapy tasks with rare nonverbal cues   Time 1   Period Weeks   Status On-going          SLP Long Term Goals - 05/13/15 1509    SLP LONG TERM GOAL #1   Title pt will utilize memory strategies during 4 therapy sessions   Baseline none demonstrated   Time 5   Period Weeks  or 12 more visits   Status On-going   SLP LONG TERM GOAL #2   Title pt will demo alternating attention in a mod distracting environment with modified independence (pt will self-correct/double check)   Baseline WNL alternating  attention for simple tasks in quiet environment   Time 5   Period Weeks   Status On-going   SLP LONG TERM GOAL #3   Title pt will demo emergent awareness, and attention appropriate for witholding extraneous conversation during therapy tasks, independently   Time 5   Period Weeks   Status On-going          Plan - 05/13/15 1446    Clinical Impression Statement Alternating attention tasks appeared WNL. Pt's tasks with divided attention exhibited reduced accuracy. Continue skilled ST to maximize cognitive-linguistic skills for possible return to work.   Speech Therapy Frequency 2x / week   Duration --  5 weeks or 11 more visits   Treatment/Interventions Internal/external aids;Compensatory strategies;Cognitive reorganization;SLP instruction and feedback;Patient/family education;Functional tasks;Cueing hierarchy   Potential to Achieve Goals Good        Problem List Patient Active Problem List   Diagnosis Date Noted  . Traumatic brain injury with loss of consciousness (HCC) 03/30/2015  . 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 Bipolar disorder (HCC) 03/30/2015  . 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 SAH (subarachnoid hemorrhage) (HCC) 01/31/2015    [facility] , MS, CCC-SLP  05/13/2015, 3:10 PM   [facility] [address] [place], [place], [address] Phone: [phone]   Fax:  [phone]   Name: Laurie Booker MRN: [id number] Date of Birth: Oct 24, 1965

## 2015-05-13 NOTE — Patient Instructions (Addendum)
  Please complete the assigned speech therapy homework and return it to your next session.  

## 2015-05-31 ENCOUNTER — Ambulatory Visit: Payer: PRIVATE HEALTH INSURANCE | Attending: Neurosurgery

## 2015-05-31 DIAGNOSIS — F4323 Adjustment disorder with mixed anxiety and depressed mood: Secondary | ICD-10-CM | POA: Insufficient documentation

## 2015-05-31 DIAGNOSIS — X58XXXD Exposure to other specified factors, subsequent encounter: Secondary | ICD-10-CM | POA: Diagnosis not present

## 2015-05-31 DIAGNOSIS — S069X1D Unspecified intracranial injury with loss of consciousness of 30 minutes or less, subsequent encounter: Secondary | ICD-10-CM | POA: Diagnosis present

## 2015-05-31 DIAGNOSIS — G3184 Mild cognitive impairment, so stated: Secondary | ICD-10-CM | POA: Insufficient documentation

## 2015-05-31 DIAGNOSIS — R41841 Cognitive communication deficit: Secondary | ICD-10-CM | POA: Insufficient documentation

## 2015-05-31 DIAGNOSIS — F3170 Bipolar disorder, currently in remission, most recent episode unspecified: Secondary | ICD-10-CM | POA: Insufficient documentation

## 2015-05-31 NOTE — Therapy (Signed)
[facility] [address] [place], [place], [address] Phone: [phone]   Fax:  [phone]  Speech Language Pathology Treatment  Patient Details  Name: Laurie Booker MRN: [id number] Date of Birth: 10/02/1965 No Data Recorded  Encounter Date: 05/31/2015      End of Session - 05/31/15 0930    Visit Number 7   Number of Visits 17   Date for SLP Re-Evaluation 06/11/15   SLP Start Time 0848   SLP Stop Time  0930  but only 20 minutes of therapeutic activity today   SLP Time Calculation (min) 42 min   Activity Tolerance Other (comment)  limited by concern over unknown in pt's case      Past Medical History  Diagnosis Date  . Osteoporosis   . Blood clots in brain     d/t traumatic brain injury Aug 2016  . Traumatic brain injury [facility])     with loss of consciousness, intubated, at [facility] August 2016, closed, hemorrhage  . Anxiety   . Depression     behavioral health admission 2009 or 2010  . H/O suicide attempt 2009  . Bipolar disorder ([place])   . H/O transfusion of packed red blood cells   . Chronic kidney disease     follows up with Dr. Olivia Mackie: nephrologist in [place]. Pt cannot state what is wrong with her kidneys; just that she has issues with them. (numerous renal cysts on 01/2015 CT)    Past Surgical History  Procedure Laterality Date  . Cesarean section    . Rhinoplasty    . Septoplasty    . Dilation and curettage of uterus    . Colonoscopy w/ polypectomy    . Vocal cord surgery      granulomas removed after intubation d/t suicide attempt  . Microlaryngoscopy with co2 laser and excision of vocal cord lesion N/A 03/19/2015    Procedure: MICROLARYNGOSCOPY WITH CO2 LASER AND EXCISION OF VOCAL CORD LESION;  Surgeon: Melida Quitter, MD;  Location: [facility];  Service: ENT;  Laterality: N/A;  Micro Direct Laryngoscopy with co2 laser excision of bilateral vocal cord granulomas/jet venturi ventilation    There were no vitals  filed for this visit.  Visit Diagnosis: Cognitive communication deficit      Subjective Assessment - 05/31/15 0900    Subjective "I see Dr. Naaman Plummer Wednesday and thank god - I need to see him."               ADULT SLP TREATMENT - 05/31/15 0905    General Information   Behavior/Cognition Alert;Cooperative  pt distressed about return to work   Treatment Provided   Treatment provided Cognitive-Linquistic   Pain Assessment   Pain Assessment No/denies pain   Cognitive-Linquistic Treatment   Treatment focused on Cognition   Skilled Treatment Pt expressing distress over unknowns in her life currently. "I feel like I can't breathe."  SLP educated pt about options for return to work; Emergency planning/management officer, working with current employer about gradual back to work at appropriate time, or apply for disability. SLP provided assistance about whether or not to involve an accountant in pt's financial situation, given her circumstances.   Assessment / Recommendations / Plan   Plan Continue with current plan of care  cancel 06-01-15 approintment & talk with MD 06-02-15 re: cont    Progression Toward Goals   Progression toward goals Progressing toward goals          SLP Education - 05/31/15 [phone]  Education provided Yes   Education Details options for work given pt's situation currently   [employer]) Educated Patient   Methods Explanation   Comprehension Verbalized understanding          SLP Short Term Goals - 05/31/15 1237    SLP SHORT TERM GOAL #1   Title pt will demo alternating attention during mod complex cognitive-linguistic tasks in [place] room with modified independence (pt checking for errors)   Baseline WNL in simple tasks    Time 1   Period Weeks   Status Achieved   SLP SHORT TERM GOAL #2   Title pt will tell SLP 6 memory strategies/compensations   Baseline none provided yet   Time 1   Period Weeks   Status Not Met   SLP SHORT TERM GOAL #3   Title pt will demo emergent awareness  and selective attention adequate to quell verbosity during therapy tasks with rare nonverbal cues   Time 1   Period Weeks   Status Not Met          SLP Long Term Goals - 05/31/15 1237    SLP LONG TERM GOAL #1   Title pt will utilize memory strategies during 4 therapy sessions   Baseline none demonstrated   Time 4   Period Weeks  or 12 more visits   Status On-going   SLP LONG TERM GOAL #2   Title pt will demo alternating attention in a mod distracting environment with modified independence (pt will self-correct/double check)   Baseline WNL alternating attention for simple tasks in quiet environment   Time 4   Period Weeks   Status On-going   SLP LONG TERM GOAL #3   Title pt will demo emergent awareness, and attention appropriate for witholding extraneous conversation during therapy tasks, independently   Time 4   Period Weeks   Status On-going          Plan - 05/31/15 1236    Clinical Impression Statement Pt upset today with unknowns in her situation currently. Pt to talk with Dr. Naaman Plummer on Wednesday to see whether or not to cont skilled ST services at this time or to take a break until other things can be sorted out in her life.   Speech Therapy Frequency 2x / week   Duration 4 weeks  or 10 more visits   Treatment/Interventions Internal/external aids;Compensatory strategies;Cognitive reorganization;SLP instruction and feedback;Patient/family education;Functional tasks;Cueing hierarchy   Potential Considerations Severity of impairments        Problem List Patient Active Problem List   Diagnosis Date Noted  . Traumatic brain injury with loss of consciousness ([place]) 03/30/2015  . 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 Bipolar disorder ([place]) 03/30/2015  . 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 SAH (subarachnoid hemorrhage) ([place]) 01/31/2015    [facility] , [place], [place]  05/31/2015, 12:39 PM  [facility] [address] [place], [place], [address] Phone: [phone]   Fax:  [phone]   Name: Laurie Booker MRN: [id number] Date of Birth: 30-Jun-1965

## 2015-05-31 NOTE — Patient Instructions (Signed)
Talk with Dr. Riley KillSwartz Wednesday re: back to work situation, and assess with him whether you'd like to take a short break from ST due to your focus on other concerns at this time, or to continue.

## 2015-06-02 ENCOUNTER — Encounter: Payer: Self-pay | Admitting: Physical Medicine & Rehabilitation

## 2015-06-02 ENCOUNTER — Encounter
Payer: PRIVATE HEALTH INSURANCE | Attending: Physical Medicine & Rehabilitation | Admitting: Physical Medicine & Rehabilitation

## 2015-06-02 VITALS — BP 126/73 | HR 71

## 2015-06-02 DIAGNOSIS — G44309 Post-traumatic headache, unspecified, not intractable: Secondary | ICD-10-CM | POA: Diagnosis not present

## 2015-06-02 DIAGNOSIS — Z79899 Other long term (current) drug therapy: Secondary | ICD-10-CM | POA: Diagnosis not present

## 2015-06-02 DIAGNOSIS — M810 Age-related osteoporosis without current pathological fracture: Secondary | ICD-10-CM | POA: Insufficient documentation

## 2015-06-02 DIAGNOSIS — F310 Bipolar disorder, current episode hypomanic: Secondary | ICD-10-CM

## 2015-06-02 DIAGNOSIS — I669 Occlusion and stenosis of unspecified cerebral artery: Secondary | ICD-10-CM | POA: Insufficient documentation

## 2015-06-02 DIAGNOSIS — Z5181 Encounter for therapeutic drug level monitoring: Secondary | ICD-10-CM | POA: Insufficient documentation

## 2015-06-02 DIAGNOSIS — F419 Anxiety disorder, unspecified: Secondary | ICD-10-CM | POA: Insufficient documentation

## 2015-06-02 DIAGNOSIS — H8110 Benign paroxysmal vertigo, unspecified ear: Secondary | ICD-10-CM | POA: Diagnosis not present

## 2015-06-02 DIAGNOSIS — N189 Chronic kidney disease, unspecified: Secondary | ICD-10-CM | POA: Insufficient documentation

## 2015-06-02 DIAGNOSIS — S069X2S Unspecified intracranial injury with loss of consciousness of 31 minutes to 59 minutes, sequela: Secondary | ICD-10-CM | POA: Insufficient documentation

## 2015-06-02 DIAGNOSIS — S069X0S Unspecified intracranial injury without loss of consciousness, sequela: Secondary | ICD-10-CM

## 2015-06-02 DIAGNOSIS — F3161 Bipolar disorder, current episode mixed, mild: Secondary | ICD-10-CM | POA: Diagnosis not present

## 2015-06-02 DIAGNOSIS — S069X3S Unspecified intracranial injury with loss of consciousness of 1 hour to 5 hours 59 minutes, sequela: Secondary | ICD-10-CM | POA: Diagnosis not present

## 2015-06-02 MED ORDER — ALPRAZOLAM 0.5 MG PO TABS
0.5000 mg | ORAL_TABLET | ORAL | Status: DC
Start: 2015-06-02 — End: 2015-08-02

## 2015-06-02 NOTE — Progress Notes (Signed)
Subjective:    Patient ID: Laurie Booker, female    DOB: February 01, 1966, 49 y.o.   MRN: [id number]  HPI   Laurie Booker is here in follow up of her TBI and associated cognitive and behavioral deficits.   She is sleeping fairly well getting around 7-8 hours per night although she thinks that she needs more based on what i told her here in the office. She takes  trazodone at night.   Her tremors seem worse and she is having more dizziness and weakness on the left side. Her equilibrium is poor as a result---she has had several falls and has trouble getting up the stairs. She had a bad head cold/URI prior to these symptoms coming on. PT signed off. She hasn't been consistent with HEP. She had her lithium level checked and apparently it was normal. She maintains on the propranolol  tid.   She has noticed increasing difficulties with memory of items in the distant past as opposed to more recent/new information. She is using a memory guide currently. She still sees SLP for cognitive remediation. She doesn't feel that she needs to go any more  There have been ongoing stressors regarding her work and pressure to return to work.  Her job is moving to [employer] at the end of the month, and she is being asked to return to work to help organize the transition. She understands that she is not ready, but at the same time, she is very anxious regarding the financial ramifications. She also has stressors related to an investment she and her husband have as well as some other matters.      Pain Inventory Average Pain 6 Pain Right Now 6 My pain is intermittent, dull and stabbing  In the last 24 hours, has pain interfered with the following? General activity 2 Relation with others 2 Enjoyment of life 2 What TIME of day is your pain at its worst? evening Sleep (in general) Poor  Pain is worse with: bending, unsure and some activites Pain improves with: medication Relief from Meds: 8  Mobility walk with  assistance use a cane how many minutes can you walk? 20 ability to climb steps?  yes do you drive?  no Do you have any goals in this area?  yes  Function not employed: date last employed 01/29/2015 I need assistance with the following:  meal prep, household duties and shopping  Neuro/Psych tremor trouble walking dizziness confusion depression anxiety  Prior Studies Any changes since last visit?  no  Physicians involved in your care Any changes since last visit?  no   History reviewed. No pertinent family history. Social History   Social History  . Marital Status: Legally Separated    Spouse Name: N/A  . Number of Children: N/A  . Years of Education: N/A   Social History Main Topics  . Smoking status: Never Smoker   . Smokeless tobacco: None  . Alcohol Use: Yes     Comment: socially  . Drug Use: None  . Sexual Activity: Not Asked   Other Topics Concern  . None   Social History Narrative   Past Surgical History  Procedure Laterality Date  . Cesarean section    . Rhinoplasty    . Septoplasty    . Dilation and curettage of uterus    . Colonoscopy w/ polypectomy    . Vocal cord surgery      granulomas removed after intubation d/t suicide attempt  . Microlaryngoscopy with co2 laser and  excision of vocal cord lesion N/A 03/19/2015    Procedure: MICROLARYNGOSCOPY WITH CO2 LASER AND EXCISION OF VOCAL CORD LESION;  Surgeon: Christia Readingwight Bates, MD;  [facility];  Service: ENT;  Laterality: N/A;  Micro Direct Laryngoscopy with co2 laser excision of bilateral vocal cord granulomas/jet venturi ventilation   Past Medical History  Diagnosis Date  . Osteoporosis   . Blood clots in brain     d/t traumatic brain injury Aug 2016  . Traumatic brain injury [facility](HCC)     with loss of consciousness, [facility], [facility] August [facility], closed, hemorrhage  . Anxiety   . Depression     behavioral health admission 2009 or 2010  . H/O suicide attempt 2009  . Bipolar disorder (HCC)   .  H/O transfusion of packed red blood cells   . Chronic kidney disease     follows up with Dr. Lequita HaltLufadeju: nephrologist in [place]. Pt cannot state what is wrong with her kidneys; just that she has issues with them. (numerous renal cysts on 01/2015 CT)   BP 126/73 mmHg  Pulse 71  SpO2 98%  LMP  (LMP Unknown)  Opioid Risk Score:   Fall Risk Score:  `1  Depression screen PHQ 2/9  Depression screen PHQ 2/9 03/30/2015  Decreased Interest 1  Down, Depressed, Hopeless 2  PHQ - 2 Score 3  Altered sleeping 2  Tired, decreased energy 2  Change in appetite 1  Feeling bad or failure about yourself  2  Trouble concentrating 2  Moving slowly or fidgety/restless 2  Suicidal thoughts 1  PHQ-9 Score 15  Difficult doing work/chores Very difficult     Review of Systems  Constitutional: Positive for appetite change.  Musculoskeletal: Positive for gait problem.       Tremor  Neurological: Positive for dizziness.  Psychiatric/Behavioral: Positive for confusion. The patient is nervous/anxious.        Depression  All other systems reviewed and are negative.      Objective:   Physical Exam  General: Alert and oriented x 3, No apparent distress.  HEENT: Head is normocephalic, atraumatic, PERRLA, EOMI, sclera anicteric, oral mucosa pink and moist, dentition intact, ext ear canals clear,  Neck: Supple without JVD or lymphadenopathy  Heart: Reg rate and rhythm. No murmurs rubs or gallops  Chest: CTA bilaterally without wheezes, rales, or rhonchi; no distress  Abdomen: Soft, non-tender, non-distended, bowel sounds positive.  Extremities: No clubbing, cyanosis, or edema. Pulses are 2+  Skin: Clean and intact without signs of breakdown  Neuro: Oriented to date, place, reason. IMPROVED attention and focus.  Good insight and awareness. 1-2 beats nystagmus with right gaze after head turn  Cranial nerves 2-12 are intact. Sensory exam is normal. Reflexes are 2+ in all 4's. Fine motor coordination is intact. No  tremors. Motor function is grossly 5/5. Balance better with cane.Fine, mild tremor LUE with voluntary movements. right UE  Involved more now.  Musculoskeletal: Full ROM, No pain with AROM or PROM in the neck, trunk, or extremities. Posture appropriate  Psych: Pt's affect is pleasant and anxious. She was cooperative. No aggressive behavior was seen. She was non-irritable.  Assessment & Plan:   Assessment:  1. Traumatic brain injury 01/31/15 after fall --SAH, Right frontal contusion, Right middle cranial fossa EDH  -pt with subsequent cognitive/behavioral deficits post injury  -i think a lot of her symptom progression is related to her mood/anxiety. She has made noticeable progress with her memory and concentration. I reiterated to her that  loss of long term memory is not product of her TBI. Additionally her tremor is affecting her unaffected side! 2. Post traumatic headaches  3. Hx of bipolar disorder.   Plan:  1. Revisit PT for vestibular re-eval. Would also like to refer to Dr. Leonides Cave for coping, anxiety mgt/stress mgt. Has hx of bipolar.  2. Continue lamictal, lithium,  for her bipolar disorder as she's been doing. Increase xanax to 0.5mg  bid scheduled with a prn dose if needed. 3. I am fine with 8 hours per night of sleep.. Can maintain trazodone to  qhs.  4. Wean propranolol to off.  Increase xanax to 0.5mg  TID to help tremors, anxiety.  Could consider another anticonvulsant for her mood, but considering she's already on lamictal. 5. Continue utilization different organizational tactics, taking notes, working on tasks one at a time, having quiet work spaces, etc. She is making nice progress here!! 6. Follow up with me in about a month. 30 minutes of face to face patient care time were spent during this visit. All questions were encouraged and answered.     Her psychiatrist in [place]: Raeford Razor, MD, pHD [phone]. She actively remains under his care.

## 2015-06-02 NOTE — Patient Instructions (Addendum)
CONTINUE TO TAKE THINGS ONE DAY AT A TIME. YOU NEED TO FOCUS ON HOW YOU WILL APPROACH YOUR FINANCES FOR THE NEXT FEW MONTHS TO BUY YOUR SELF SOME TIME TO FEEL BETTER.    DECREASE PROPRANOLOL TO TWICE DAILY FOR 4 DAYS THEN ONCE DAILY FOR 4 DAYS THEN OFF.    PLEASE CALL ME WITH ANY PROBLEMS OR QUESTIONS [phone]). HAVE A HAPPY HOLIDAY SEASON!!!

## 2015-06-08 ENCOUNTER — Ambulatory Visit: Payer: PRIVATE HEALTH INSURANCE

## 2015-06-11 ENCOUNTER — Telehealth: Payer: Self-pay

## 2015-06-11 ENCOUNTER — Ambulatory Visit: Payer: PRIVATE HEALTH INSURANCE

## 2015-06-11 NOTE — Telephone Encounter (Signed)
LM informing pt of two no shows, and asking pt to please call to confirm whether or not she wants to cont in ST. 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

## 2015-06-15 ENCOUNTER — Ambulatory Visit: Payer: PRIVATE HEALTH INSURANCE

## 2015-06-16 ENCOUNTER — Ambulatory Visit (INDEPENDENT_AMBULATORY_CARE_PROVIDER_SITE_OTHER): Payer: PRIVATE HEALTH INSURANCE | Admitting: Psychology

## 2015-06-16 DIAGNOSIS — F0280 Dementia in other diseases classified elsewhere without behavioral disturbance: Secondary | ICD-10-CM | POA: Diagnosis not present

## 2015-06-16 DIAGNOSIS — S069X9S Unspecified intracranial injury with loss of consciousness of unspecified duration, sequela: Secondary | ICD-10-CM | POA: Diagnosis not present

## 2015-06-16 DIAGNOSIS — G3184 Mild cognitive impairment, so stated: Secondary | ICD-10-CM

## 2015-06-16 DIAGNOSIS — F3170 Bipolar disorder, currently in remission, most recent episode unspecified: Secondary | ICD-10-CM

## 2015-06-16 DIAGNOSIS — S069X1D Unspecified intracranial injury with loss of consciousness of 30 minutes or less, subsequent encounter: Secondary | ICD-10-CM

## 2015-06-16 DIAGNOSIS — F4323 Adjustment disorder with mixed anxiety and depressed mood: Secondary | ICD-10-CM
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
## 2015-06-30 ENCOUNTER — Ambulatory Visit: Payer: PRIVATE HEALTH INSURANCE | Admitting: Psychology

## 2015-07-20 ENCOUNTER — Telehealth: Payer: Self-pay | Admitting: Psychology

## 2015-07-20 NOTE — Telephone Encounter (Signed)
Laurie Booker had cancelled her appointment on 06/30/15 due to illness. I called to check in as I had not heard back from her. She stated that she is in the process of changing her medical insurance and is not currently covered. I reminded her that she is scheduled for neuropsychological testing on 08/13/15. She will call back if she cannot make that appointment.

## 2015-08-02 ENCOUNTER — Encounter
Payer: PRIVATE HEALTH INSURANCE | Attending: Physical Medicine & Rehabilitation | Admitting: Physical Medicine & Rehabilitation

## 2015-08-02 ENCOUNTER — Encounter: Payer: Self-pay | Admitting: Physical Medicine & Rehabilitation

## 2015-08-02 VITALS — BP 120/81 | HR 67 | Resp 14

## 2015-08-02 DIAGNOSIS — S069X3S Unspecified intracranial injury with loss of consciousness of 1 hour to 5 hours 59 minutes, sequela: Secondary | ICD-10-CM | POA: Diagnosis not present

## 2015-08-02 DIAGNOSIS — F419 Anxiety disorder, unspecified: Secondary | ICD-10-CM | POA: Insufficient documentation

## 2015-08-02 DIAGNOSIS — F3170 Bipolar disorder, currently in remission, most recent episode unspecified: Secondary | ICD-10-CM | POA: Diagnosis not present

## 2015-08-02 DIAGNOSIS — F3161 Bipolar disorder, current episode mixed, mild: Secondary | ICD-10-CM | POA: Diagnosis not present

## 2015-08-02 DIAGNOSIS — N189 Chronic kidney disease, unspecified: Secondary | ICD-10-CM | POA: Insufficient documentation

## 2015-08-02 DIAGNOSIS — Z5181 Encounter for therapeutic drug level monitoring: Secondary | ICD-10-CM | POA: Insufficient documentation

## 2015-08-02 DIAGNOSIS — Z79899 Other long term (current) drug therapy: Secondary | ICD-10-CM | POA: Diagnosis not present

## 2015-08-02 DIAGNOSIS — I669 Occlusion and stenosis of unspecified cerebral artery: Secondary | ICD-10-CM | POA: Insufficient documentation

## 2015-08-02 DIAGNOSIS — M810 Age-related osteoporosis without current pathological fracture: Secondary | ICD-10-CM | POA: Diagnosis not present

## 2015-08-02 DIAGNOSIS — S069XAS Unspecified intracranial injury with loss of consciousness status unknown, sequela: Secondary | ICD-10-CM

## 2015-08-02 DIAGNOSIS — F310 Bipolar disorder, current episode hypomanic: Secondary | ICD-10-CM

## 2015-08-02 DIAGNOSIS — G44309 Post-traumatic headache, unspecified, not intractable: Secondary | ICD-10-CM | POA: Insufficient documentation

## 2015-08-02 DIAGNOSIS — S069X2S Unspecified intracranial injury with loss of consciousness of 31 minutes to 59 minutes, sequela: Secondary | ICD-10-CM | POA: Diagnosis present

## 2015-08-02 DIAGNOSIS — S069X0S Unspecified intracranial injury without loss of consciousness, sequela: Secondary | ICD-10-CM | POA: Diagnosis not present

## 2015-08-02 MED ORDER — ALPRAZOLAM 0.5 MG PO TABS
0.5000 mg | ORAL_TABLET | ORAL | Status: DC
Start: 2015-08-02 — End: 2016-03-08

## 2015-08-02 MED ORDER — METHYLPHENIDATE HCL 5 MG PO TABS: 5.0000 mg | ORAL_TABLET | Freq: Two times a day (BID) | ORAL | Status: DC

## 2015-08-02 MED ORDER — TRAZODONE HCL 100 MG PO TABS
100.0000 mg | ORAL_TABLET | Freq: Every day | ORAL | Status: DC
Start: 2015-08-02 — End: 2016-01-10

## 2015-08-02 NOTE — Progress Notes (Signed)
Subjective:    Patient ID: Laurie Booker, female    DOB: [birth year], 50 y.o.   MRN: [id number]  HPI   Laurie Booker is here in follow up of her TBI and associated deficits. She has had some issues with dry mouth. She wakes up sometimes to drink a glass of water. She was found to have hematuria which was worked up including a CT scan---pt states work up was unremarkable.   She still struggles with her memory and attention. She gave me numerous examples today but shifts quickly from topic to topic. She has crying spells at times but they're less.     Pain Inventory Average Pain 6 Pain Right Now 3 My pain is intermittent, stabbing and aching  In the last 24 hours, has pain interfered with the following? General activity 2 Relation with others 2 Enjoyment of life 2 What TIME of day is your pain at its worst? evening Sleep (in general) Fair  Pain is worse with: bending and some activites Pain improves with: heat/ice and medication Relief from Meds: 8  Mobility walk without assistance use a cane how many minutes can you walk? 15 ability to climb steps?  yes do you drive?  yes  Function not employed: date last employed 01/31/15 I need assistance with the following:  meal prep, household duties and shopping Do you have any goals in this area?  yes  Neuro/Psych tremor trouble walking dizziness confusion anxiety  Prior Studies CT/MRI  Physicians involved in your care Any changes since last visit?  yes   History reviewed. No pertinent family history. Social History   Social History  . Marital Status: Legally Separated    Spouse Name: N/A  . Number of Children: N/A  . Years of Education: N/A   Social History Main Topics  . Smoking status: Never Smoker   . Smokeless tobacco: None  . Alcohol Use: Yes     Comment: socially  . Drug Use: None  . Sexual Activity: Not Asked   Other Topics Concern  . None   Social History Narrative   Past Surgical History  Procedure  Laterality Date  . Cesarean section    . Rhinoplasty    . Septoplasty    . Dilation and curettage of uterus    . Colonoscopy w/ polypectomy    . Vocal cord surgery      granulomas removed after intubation d/t suicide attempt  . Microlaryngoscopy with co2 laser and excision of vocal cord lesion N/A 03/19/2015    Procedure: MICROLARYNGOSCOPY WITH CO2 LASER AND EXCISION OF VOCAL CORD LESION;  Surgeon: Christia Reading, MD;  Location: [facility] OR;  Service: ENT;  Laterality: N/A;  Micro Direct Laryngoscopy with co2 laser excision of bilateral vocal cord granulomas/jet venturi ventilation   Past Medical History  Diagnosis Date  . Osteoporosis   . Blood clots in brain     d/t traumatic brain injury Aug 2016  . Traumatic brain injury [facility])     with loss of consciousness, intubated, at [facility] August 2016, closed, hemorrhage  . Anxiety   . Depression     behavioral health admission 2009 or 2010  . H/O suicide attempt 2009  . Bipolar disorder (HCC)   . H/O transfusion of packed red blood cells   . Chronic kidney disease     follows up with Dr. Lequita Halt: nephrologist in [place]. Pt cannot state what is wrong with her kidneys; just that she has issues with them. (numerous renal cysts on 01/2015 CT)  BP 120/81 mmHg  Pulse 67  Resp 14  LMP  (LMP Unknown)  Opioid Risk Score:   Fall Risk Score:  `1  Depression screen PHQ 2/9  Depression screen PHQ 2/9 03/30/2015  Decreased Interest 1  Down, Depressed, Hopeless 2  PHQ - 2 Score 3  Altered sleeping 2  Tired, decreased energy 2  Change in appetite 1  Feeling bad or failure about yourself  2  Trouble concentrating 2  Moving slowly or fidgety/restless 2  Suicidal thoughts 1  PHQ-9 Score 15  Difficult doing work/chores Very difficult     Review of Systems  Constitutional: Positive for appetite change.  Musculoskeletal: Positive for gait problem.  Neurological: Positive for dizziness and tremors.  Psychiatric/Behavioral: Positive for confusion. The  patient is nervous/anxious.   All other systems reviewed and are negative.      Objective:   Physical Exam  General: Alert and oriented x 3, No apparent distress.  HEENT: Head is normocephalic, atraumatic, PERRLA, EOMI, sclera anicteric, oral mucosa pink and moist, dentition intact, ext ear canals clear,  Neck: Supple without JVD or lymphadenopathy  Heart: Reg rate and rhythm. No murmurs rubs or gallops  Chest: CTA bilaterally without wheezes, rales, or rhonchi; no distress  Abdomen: Soft, non-tender, non-distended, bowel sounds positive.  Extremities: No clubbing, cyanosis, or edema. Pulses are 2+  Skin: Clean and intact without signs of breakdown  Neuro: Oriented to date, place, reason.  attention and focus are better but she still struggles conversationally. Uses notes to remind herself what she wanted to ask.Peri Jefferson insight and awareness.  Cranial nerves 2-12 are intact. Sensory exam is normal. Reflexes are 2+ in all 4's. Fine motor coordination is intact. No tremors. Motor function is grossly 5/5. Balance better with cane.Fine, mild tremor LUE with voluntary movements. right UE Involved more now.  Musculoskeletal: Full ROM, No pain with AROM or PROM in the neck, trunk, or extremities. Posture appropriate  Psych: Pt's affect is pleasant and anxious. She was cooperative. No aggressive behavior was seen. She was non-irritable.    Assessment & Plan:   Assessment:  1. Traumatic brain injury 01/31/15 after fall --SAH, Right frontal contusion, Right middle cranial fossa EDH  -pt with subsequent cognitive/behavioral deficits post injury  -ongoing issues with attention/focus (although improved) which are impacting memory and cognitive function in higher level tasks -emotional lability seems to be improving.  2. Post traumatic headaches  3. Hx of bipolar disorder.    Plan:  1.  Follow up Dr. Leonides Cave for coping, anxiety mgt/stress mgt. Has hx of bipolar.  2. Continue lamictal, lithium, for  her bipolar disorder as she's been doing. Increase xanax to 0.5mg  bid scheduled with a prn dose if needed.  3. Can maintain trazodone to  qhs.  4. Continue xanax to 0.5mg  TID to help tremors, anxiety. Consider another anticonvulsant for her mood, but considering she's already on lamictal.  Nuedexta trial for emotional lability if persistent?---think this is improving however. 5. Ritalin trial,  BID. Continue utilization different organizational tactics, taking notes, working on tasks one at a time, having quiet work spaces, etc. She is making nice progress here!!  6. Follow up with me in about a month. 30 minutes of face to face patient care time were spent during this visit. All questions were encouraged and answered.   Her psychiatrist in [place]: Raeford Razor, MD, pHD [phone]. She actively remains under his care.

## 2015-08-02 NOTE — Patient Instructions (Addendum)
PLEASE CALL ME WITH ANY PROBLEMS OR QUESTIONS (#[phone]).     YOU CAN INCREASE THE RITALIN AFTER 4-7 DAYS TO  IF YOU DON'T FEEL THE  DOSE IS HELPING YOUR ENERGY AND FOCUS/ATTENTION

## 2015-08-04 ENCOUNTER — Other Ambulatory Visit: Payer: Self-pay | Admitting: Physical Medicine & Rehabilitation

## 2015-08-05 ENCOUNTER — Encounter: Payer: Self-pay | Admitting: Speech Pathology

## 2015-08-05 NOTE — Therapy (Signed)
[facility] [address] [place], [place], [address] Phone: [phone]   Fax:  [phone]  Patient Details  Name: Laurie Booker MRN: [id number] Date of Birth: December 13, 1965 Referring Provider:  No ref. provider found  Encounter Date: 08/05/2015   SPEECH THERAPY DISCHARGE SUMMARY  Visits from Start of Care: 7, 03/12/15 to 05/31/15  Current functional level related to goals / functional outcomes: See goals below     SLP Long Term Goals - 05/31/15 1237    SLP LONG TERM GOAL #1   Title pt will utilize memory strategies during 4 therapy sessions   Baseline none demonstrated   Time 4   Period Weeks  or 12 more visits   Status Not met   SLP LONG TERM GOAL #2   Title pt will demo alternating attention in a mod distracting environment with modified independence (pt will self-correct/double check)   Baseline WNL alternating attention for simple tasks in quiet environment   Time 4   Period Weeks   Status Not ,met   SLP LONG TERM GOAL #3   Title pt will demo emergent awareness, and attention appropriate for witholding extraneous conversation during therapy tasks, independently   Time 4   Period Weeks   Status Not met          Remaining deficits: See goals   Education / Equipment: Compensations for attention and memory Plan: Patient agrees to discharge.  Patient goals were not met. Patient is being discharged due to not returning since the last visit.  ?????        Booker, Laurie Rusk 08/05/2015, 2:39 PM  [facility] [address] [place], [place], [address] Phone: [phone]   Fax:  [phone]

## 2015-08-13 ENCOUNTER — Ambulatory Visit: Payer: PRIVATE HEALTH INSURANCE | Admitting: Psychology

## 2015-08-27 ENCOUNTER — Encounter: Payer: Self-pay | Admitting: Psychology

## 2015-08-27 ENCOUNTER — Ambulatory Visit: Payer: PRIVATE HEALTH INSURANCE | Attending: Neurosurgery | Admitting: Psychology

## 2015-08-27 DIAGNOSIS — G3184 Mild cognitive impairment, so stated: Secondary | ICD-10-CM | POA: Diagnosis not present

## 2015-08-27 DIAGNOSIS — S069X1D Unspecified intracranial injury with loss of consciousness of 30 minutes or less, subsequent encounter: Secondary | ICD-10-CM | POA: Diagnosis not present

## 2015-08-27 DIAGNOSIS — F4323 Adjustment disorder with mixed anxiety and depressed mood: Secondary | ICD-10-CM | POA: Insufficient documentation

## 2015-08-27 DIAGNOSIS — F3170 Bipolar disorder, currently in remission, most recent episode unspecified: Secondary | ICD-10-CM | POA: Insufficient documentation

## 2015-08-27 NOTE — Progress Notes (Signed)
[facility]  [address]   Telephone [phone] [address] Fax [phone] [place], [place] [address]  Initial Contact Note  Name:  Laurie Booker Date of Birth; 04/17/1966 MRN:  [id number] Date:  08/27/2015  Laurie Booker is an 50 y.o. female who sustained a traumatic brain injury in 2016. She was referred for neuropsychological evaluation by Faith RogueZachary Swartz MD.   A total of 6 hours was spent administering and scoring neurocognitive tests, and preparing a written report (CPT [phone] & [phone]).  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   A full report will follow once the results and recommendations from this evaluation have been reviewed with her. Her next appointment is scheduled for 08/31/15.Gladstone Pih.   Alie Moudy F. Myisha Pickerel, Ph.D Licensed Psychologist 08/27/2015

## 2015-08-30 ENCOUNTER — Encounter
Payer: PRIVATE HEALTH INSURANCE | Attending: Physical Medicine & Rehabilitation | Admitting: Physical Medicine & Rehabilitation

## 2015-08-30 DIAGNOSIS — N189 Chronic kidney disease, unspecified: Secondary | ICD-10-CM | POA: Insufficient documentation

## 2015-08-30 DIAGNOSIS — Z79899 Other long term (current) drug therapy: Secondary | ICD-10-CM | POA: Insufficient documentation

## 2015-08-30 DIAGNOSIS — F3161 Bipolar disorder, current episode mixed, mild: Secondary | ICD-10-CM | POA: Insufficient documentation

## 2015-08-30 DIAGNOSIS — G44309 Post-traumatic headache, unspecified, not intractable: Secondary | ICD-10-CM | POA: Insufficient documentation

## 2015-08-30 DIAGNOSIS — Z5181 Encounter for therapeutic drug level monitoring: Secondary | ICD-10-CM | POA: Insufficient documentation

## 2015-08-30 DIAGNOSIS — F419 Anxiety disorder, unspecified: Secondary | ICD-10-CM | POA: Insufficient documentation

## 2015-08-30 DIAGNOSIS — S069X2S Unspecified intracranial injury with loss of consciousness of 31 minutes to 59 minutes, sequela: Secondary | ICD-10-CM | POA: Insufficient documentation

## 2015-08-30 DIAGNOSIS — M810 Age-related osteoporosis without current pathological fracture: Secondary | ICD-10-CM | POA: Insufficient documentation

## 2015-08-30 DIAGNOSIS — I669 Occlusion and stenosis of unspecified cerebral artery: Secondary | ICD-10-CM | POA: Insufficient documentation

## 2015-08-30 DIAGNOSIS — S069X0S Unspecified intracranial injury without loss of consciousness, sequela: Secondary | ICD-10-CM | POA: Insufficient documentation

## 2015-08-31 ENCOUNTER — Ambulatory Visit (INDEPENDENT_AMBULATORY_CARE_PROVIDER_SITE_OTHER): Payer: PRIVATE HEALTH INSURANCE | Admitting: Psychology

## 2015-08-31 ENCOUNTER — Encounter: Payer: Self-pay | Admitting: Psychology

## 2015-08-31 DIAGNOSIS — S069X1D Unspecified intracranial injury with loss of consciousness of 30 minutes or less, subsequent encounter: Secondary | ICD-10-CM

## 2015-08-31 DIAGNOSIS — G3184 Mild cognitive impairment, so stated: Secondary | ICD-10-CM | POA: Diagnosis not present

## 2015-08-31 DIAGNOSIS — F3170 Bipolar disorder, currently in remission, most recent episode unspecified: Secondary | ICD-10-CM

## 2015-08-31 DIAGNOSIS — F4323 Adjustment disorder with mixed anxiety and depressed mood: Secondary | ICD-10-CM
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
## 2015-09-01 ENCOUNTER — Ambulatory Visit: Payer: PRIVATE HEALTH INSURANCE | Admitting: Physical Medicine & Rehabilitation

## 2015-09-27 ENCOUNTER — Encounter
Payer: PRIVATE HEALTH INSURANCE | Attending: Physical Medicine & Rehabilitation | Admitting: Physical Medicine & Rehabilitation

## 2015-09-27 ENCOUNTER — Encounter: Payer: Self-pay | Admitting: Physical Medicine & Rehabilitation

## 2015-09-27 VITALS — BP 119/81 | HR 81 | Resp 14

## 2015-09-27 DIAGNOSIS — S069X9S Unspecified intracranial injury with loss of consciousness of unspecified duration, sequela: Secondary | ICD-10-CM | POA: Diagnosis not present

## 2015-09-27 DIAGNOSIS — N189 Chronic kidney disease, unspecified: Secondary | ICD-10-CM | POA: Insufficient documentation

## 2015-09-27 DIAGNOSIS — F419 Anxiety disorder, unspecified: Secondary | ICD-10-CM | POA: Diagnosis not present

## 2015-09-27 DIAGNOSIS — M810 Age-related osteoporosis without current pathological fracture: Secondary | ICD-10-CM | POA: Diagnosis not present

## 2015-09-27 DIAGNOSIS — S069X0S Unspecified intracranial injury without loss of consciousness, sequela: Secondary | ICD-10-CM

## 2015-09-27 DIAGNOSIS — H8110 Benign paroxysmal vertigo, unspecified ear: Secondary | ICD-10-CM

## 2015-09-27 DIAGNOSIS — I669 Occlusion and stenosis of unspecified cerebral artery: Secondary | ICD-10-CM | POA: Diagnosis not present

## 2015-09-27 DIAGNOSIS — G44309 Post-traumatic headache, unspecified, not intractable: Secondary | ICD-10-CM | POA: Diagnosis not present

## 2015-09-27 DIAGNOSIS — Z79899 Other long term (current) drug therapy: Secondary | ICD-10-CM | POA: Insufficient documentation

## 2015-09-27 DIAGNOSIS — F3161 Bipolar disorder, current episode mixed, mild: Secondary | ICD-10-CM | POA: Diagnosis not present

## 2015-09-27 DIAGNOSIS — S069X2S Unspecified intracranial injury with loss of consciousness of 31 minutes to 59 minutes, sequela: Secondary | ICD-10-CM | POA: Insufficient documentation

## 2015-09-27 DIAGNOSIS — Z5181 Encounter for therapeutic drug level monitoring: Secondary | ICD-10-CM | POA: Insufficient documentation

## 2015-09-27 MED ORDER — METHYLPHENIDATE HCL 10 MG PO TABS: 20.0000 mg | ORAL_TABLET | Freq: Two times a day (BID) | ORAL | Status: DC

## 2015-09-27 NOTE — Patient Instructions (Addendum)
PLEASE CALL ME WITH ANY PROBLEMS OR QUESTIONS [phone]).     ? INCREASE LAMICTAL TO 150MG ?  CONSIDER THE NEUDEXTA FOR PSEUDOBULBAR AFFECT   RITALIN: 1.5 TABS TWICE DAILY AT BREAKFAST AND LUNCH FOR 5 DAYS THEN IF NO EFFECT INCREASE TO 2 TABS
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
## 2015-10-05 ENCOUNTER — Telehealth: Payer: Self-pay | Admitting: *Deleted

## 2015-10-05 NOTE — Telephone Encounter (Signed)
Laurie Booker called and she is having problems with the Ritalin.  It is  Making her jittery and having difficulty sleeping at night.  Please advise.

## 2015-10-05 NOTE — Telephone Encounter (Signed)
Please have her reduce ritalin to 10mg . We may need to consider another stimulant.

## 2015-10-06 NOTE — Telephone Encounter (Signed)
Pt advised.

## 2015-10-25 ENCOUNTER — Encounter: Payer: Self-pay | Admitting: Physical Medicine & Rehabilitation

## 2015-10-25 ENCOUNTER — Encounter
Payer: PRIVATE HEALTH INSURANCE | Attending: Physical Medicine & Rehabilitation | Admitting: Physical Medicine & Rehabilitation

## 2015-10-25 VITALS — BP 121/76 | HR 74

## 2015-10-25 DIAGNOSIS — I669 Occlusion and stenosis of unspecified cerebral artery: Secondary | ICD-10-CM | POA: Diagnosis not present

## 2015-10-25 DIAGNOSIS — N189 Chronic kidney disease, unspecified: Secondary | ICD-10-CM | POA: Diagnosis not present

## 2015-10-25 DIAGNOSIS — F3170 Bipolar disorder, currently in remission, most recent episode unspecified: Secondary | ICD-10-CM

## 2015-10-25 DIAGNOSIS — M810 Age-related osteoporosis without current pathological fracture: Secondary | ICD-10-CM | POA: Insufficient documentation

## 2015-10-25 DIAGNOSIS — S069X2S Unspecified intracranial injury with loss of consciousness of 31 minutes to 59 minutes, sequela: Secondary | ICD-10-CM | POA: Diagnosis not present

## 2015-10-25 DIAGNOSIS — S069X0S Unspecified intracranial injury without loss of consciousness, sequela: Secondary | ICD-10-CM

## 2015-10-25 DIAGNOSIS — F3161 Bipolar disorder, current episode mixed, mild: Secondary | ICD-10-CM | POA: Insufficient documentation

## 2015-10-25 DIAGNOSIS — F419 Anxiety disorder, unspecified: Secondary | ICD-10-CM | POA: Insufficient documentation

## 2015-10-25 DIAGNOSIS — Z5181 Encounter for therapeutic drug level monitoring: Secondary | ICD-10-CM | POA: Insufficient documentation

## 2015-10-25 DIAGNOSIS — Z79899 Other long term (current) drug therapy: Secondary | ICD-10-CM | POA: Diagnosis not present

## 2015-10-25 DIAGNOSIS — G44309 Post-traumatic headache, unspecified, not intractable: Secondary | ICD-10-CM | POA: Diagnosis not present

## 2015-10-25 DIAGNOSIS — I609 Nontraumatic subarachnoid hemorrhage, unspecified: Secondary | ICD-10-CM | POA: Diagnosis not present

## 2015-10-25 DIAGNOSIS — S069X3S Unspecified intracranial injury with loss of consciousness of 1 hour to 5 hours 59 minutes, sequela: Secondary | ICD-10-CM

## 2015-10-25 MED ORDER — TRAMADOL HCL 50 MG PO TABS: 50.0000 mg | ORAL_TABLET | Freq: Two times a day (BID) | ORAL | Status: DC | PRN

## 2015-10-25 MED ORDER — RIVASTIGMINE TARTRATE 3 MG PO CAPS: 3.0000 mg | ORAL_CAPSULE | Freq: Two times a day (BID) | ORAL | Status: DC

## 2015-10-25 NOTE — Patient Instructions (Signed)
  PLEASE CALL ME WITH ANY PROBLEMS OR QUESTIONS (#[phone]).      
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
## 2015-11-04 ENCOUNTER — Telehealth: Payer: Self-pay

## 2015-11-04 NOTE — Telephone Encounter (Signed)
Pt called stating that her Exelon costs $107.75 because her insurance does not cover it. She would like to know what else she could try to replace her Ritalin? Please advise.

## 2015-11-05 NOTE — Telephone Encounter (Signed)
Since apparently they are not going to cover any medications for memory, we might want to try a different type of stimulant. straterra---10mg  daily, #30, 2rf

## 2015-11-05 NOTE — Telephone Encounter (Signed)
Per Thayer Ohmhris at [facility] Aricept is not covered either and they are not offering an alternative that is covered.

## 2015-11-05 NOTE — Telephone Encounter (Signed)
How about aricept 5mg  qhs. #30, 2RF

## 2015-11-08 MED ORDER — ATOMOXETINE HCL 10 MG PO CAPS: 10.0000 mg | ORAL_CAPSULE | Freq: Every day | ORAL | Status: DC

## 2015-11-08 NOTE — Telephone Encounter (Signed)
Rx sent to pharmacy. Patient advised. [facility]

## 2015-11-11 ENCOUNTER — Telehealth: Payer: Self-pay | Admitting: *Deleted

## 2015-11-11 NOTE — Telephone Encounter (Signed)
Pt called back stating that Rx for strattera is cost prohibitive.  She says that medication will cost $80.00 out of pocket.  She is asking for another alternative medication that is more affordable.  Pt is frustrated and admittedly realizes her confusion from her brain injury does not help. Please advise

## 2015-11-12 ENCOUNTER — Other Ambulatory Visit: Payer: Self-pay

## 2015-11-12 MED ORDER — AMPHETAMINE-DEXTROAMPHETAMINE 10 MG PO TABS: 10.0000 mg | ORAL_TABLET | Freq: Every day | ORAL | Status: DC

## 2015-11-12 NOTE — Telephone Encounter (Signed)
This was also per insurance.

## 2015-11-12 NOTE — Telephone Encounter (Signed)
We can try adderall 10mg  daily---it's similar to ritalin but the extended release mechanism may not cause as much "jitteriness" as the short acting ritalin 10mg  daily #30

## 2015-11-12 NOTE — Telephone Encounter (Signed)
I spoke with pt's pharmacy, and the only two medications that are covered under plan are Ritalin and Adderall. Please advise on what to do next.

## 2015-11-12 NOTE — Telephone Encounter (Signed)
Rx printed to be signed  

## 2015-11-12 NOTE — Telephone Encounter (Signed)
Adderall called into pharmacy. Pt is aware.

## 2015-11-12 NOTE — Telephone Encounter (Signed)
She will need to have her pharmacy or the insurance company provide us a list of meds that are stimulants or memory enhancing drugs which are covered under her formulary. We are unable to "guess" which meds are covered.

## 2015-12-10 ENCOUNTER — Telehealth: Payer: Self-pay | Admitting: Physical Medicine & Rehabilitation

## 2015-12-10 ENCOUNTER — Telehealth: Payer: Self-pay | Admitting: Registered Nurse

## 2015-12-10 MED ORDER — AMPHETAMINE-DEXTROAMPHETAMINE 10 MG PO TABS: 10.0000 mg | ORAL_TABLET | Freq: Every day | ORAL | Status: DC

## 2015-12-10 NOTE — Telephone Encounter (Addendum)
Joe with [facility] is calling to let us know that the patient is requesting a refill on her Adderall.  Called patient to let her know that she can pick up her prescription on Monday.

## 2015-12-10 NOTE — Telephone Encounter (Signed)
According to [facility] Adderall was picked up on 11/16/15. Ms. Laurie Booker was called by office staff, she is aware prescription will be ready for pick up on Monday December 13, 2015. Prescription printed.

## 2016-01-10 ENCOUNTER — Encounter: Payer: Self-pay | Admitting: Physical Medicine & Rehabilitation

## 2016-01-10 ENCOUNTER — Encounter
Payer: PRIVATE HEALTH INSURANCE | Attending: Physical Medicine & Rehabilitation | Admitting: Physical Medicine & Rehabilitation

## 2016-01-10 VITALS — BP 136/88 | HR 72

## 2016-01-10 DIAGNOSIS — M810 Age-related osteoporosis without current pathological fracture: Secondary | ICD-10-CM | POA: Diagnosis not present

## 2016-01-10 DIAGNOSIS — I669 Occlusion and stenosis of unspecified cerebral artery: Secondary | ICD-10-CM | POA: Insufficient documentation

## 2016-01-10 DIAGNOSIS — F419 Anxiety disorder, unspecified: Secondary | ICD-10-CM | POA: Diagnosis not present

## 2016-01-10 DIAGNOSIS — G44309 Post-traumatic headache, unspecified, not intractable: Secondary | ICD-10-CM

## 2016-01-10 DIAGNOSIS — S069X3S Unspecified intracranial injury with loss of consciousness of 1 hour to 5 hours 59 minutes, sequela: Secondary | ICD-10-CM | POA: Diagnosis not present

## 2016-01-10 DIAGNOSIS — S069X0S Unspecified intracranial injury without loss of consciousness, sequela: Secondary | ICD-10-CM | POA: Diagnosis not present

## 2016-01-10 DIAGNOSIS — F3161 Bipolar disorder, current episode mixed, mild: Secondary | ICD-10-CM | POA: Insufficient documentation

## 2016-01-10 DIAGNOSIS — S069X2S Unspecified intracranial injury with loss of consciousness of 31 minutes to 59 minutes, sequela: Secondary | ICD-10-CM | POA: Insufficient documentation

## 2016-01-10 DIAGNOSIS — Z79899 Other long term (current) drug therapy: Secondary | ICD-10-CM

## 2016-01-10 DIAGNOSIS — N189 Chronic kidney disease, unspecified: Secondary | ICD-10-CM | POA: Insufficient documentation

## 2016-01-10 DIAGNOSIS — Z5181 Encounter for therapeutic drug level monitoring: Secondary | ICD-10-CM

## 2016-01-10 DIAGNOSIS — F3170 Bipolar disorder, currently in remission, most recent episode unspecified: Secondary | ICD-10-CM

## 2016-01-10 MED ORDER — AMPHETAMINE-DEXTROAMPHETAMINE 10 MG PO TABS: 20.0000 mg | ORAL_TABLET | Freq: Every day | ORAL | Status: DC

## 2016-01-10 MED ORDER — TRAZODONE HCL 100 MG PO TABS: 100.0000 mg | ORAL_TABLET | Freq: Every evening | ORAL | Status: DC | PRN

## 2016-01-10 NOTE — Patient Instructions (Signed)
PLEASE CALL ME WITH ANY PROBLEMS OR QUESTIONS ([phone])  

## 2016-01-10 NOTE — Progress Notes (Signed)
Subjective:    Patient ID: Laurie Booker, female    DOB: [birth year], 50 y.o.   MRN: [id number]  HPI   Laurie Booker is here in follow up of her TBI.  She found that the Adderall has helped her attention. It does make her feel "jittery" on occasion. She will occasionally use a 1/2 or 1/4 dose to help with the side effect which has been effective.   She finds that she occasionally lists to the left but never falls. Her strength has improved on the left side.  The lamictal increase has perhaps helped her mood stability but she has been under a lot of stress which sometimes offsets the effect.   She is now wearing braces to assist with her malocclusion.   Urology has seen her for neurogenic bladder at [facility]. She has had numerous bladder infections. The diagnosis is most likely overactive bladder but apparently she is drinking quite a bit as well.    Pain Inventory Average Pain 5 Pain Right Now 4 My pain is stabbing  In the last 24 hours, has pain interfered with the following? General activity 3 Relation with others 3 Enjoyment of life 3 What TIME of day is your pain at its worst? . Sleep (in general) Fair  Pain is worse with: bending and standing Pain improves with: medication Relief from Meds: 8  Mobility use a cane how many minutes can you walk? 15 - 30 transfers alone Do you have any goals in this area?  yes  Function disabled: date disabled 01-31-15 I need assistance with the following:  household duties and shopping  Neuro/Psych tremor dizziness confusion depression anxiety loss of taste or smell  Prior Studies Any changes since last visit?  no  Physicians involved in your care Any changes since last visit?  yes   History reviewed. No pertinent family history. Social History   Social History  . Marital Status: Legally Separated    Spouse Name: N/A  . Number of Children: N/A  . Years of Education: N/A   Social History Main Topics  . Smoking status: Never Smoker    . Smokeless tobacco: None  . Alcohol Use: Yes     Comment: socially  . Drug Use: None  . Sexual Activity: Not Asked   Other Topics Concern  . None   Social History Narrative   Past Surgical History  Procedure Laterality Date  . Cesarean section    . Rhinoplasty    . Septoplasty    . Dilation and curettage of uterus    . Colonoscopy w/ polypectomy    . Vocal cord surgery      granulomas removed after intubation d/t suicide attempt  . Microlaryngoscopy with co2 laser and excision of vocal cord lesion N/A 03/19/2015    Procedure: MICROLARYNGOSCOPY WITH CO2 LASER AND EXCISION OF VOCAL CORD LESION;  Surgeon: Christia Reading, MD;  Location: [facility] OR;  Service: ENT;  Laterality: N/A;  Micro Direct Laryngoscopy with co2 laser excision of bilateral vocal cord granulomas/jet venturi ventilation   Past Medical History  Diagnosis Date  . Osteoporosis   . Blood clots in brain     d/t traumatic brain injury Aug 2016  . Traumatic brain injury [facility])     with loss of consciousness, intubated, at [facility] August 2016, closed, hemorrhage  . Anxiety   . Depression     behavioral health admission 2009 or 2010  . H/O suicide attempt 2009  . Bipolar disorder (HCC)   . H/O  transfusion of packed red blood cells   . Chronic kidney disease     follows up with Dr. Lequita Halt: nephrologist in [place]. Pt cannot state what is wrong with her kidneys; just that she has issues with them. (numerous renal cysts on 01/2015 CT)   BP 136/88 mmHg  Pulse 72  SpO2 98%  LMP  (LMP Unknown)  Opioid Risk Score:   Fall Risk Score:  `1  Depression screen PHQ 2/9  Depression screen PHQ 2/9 03/30/2015  Decreased Interest 1  Down, Depressed, Hopeless 2  PHQ - 2 Score 3  Altered sleeping 2  Tired, decreased energy 2  Change in appetite 1  Feeling bad or failure about yourself  2  Trouble concentrating 2  Moving slowly or fidgety/restless 2  Suicidal thoughts 1  PHQ-9 Score 15  Difficult doing work/chores Very difficult       Review of Systems  Constitutional: Negative.   HENT: Negative.   Eyes: Negative.   Respiratory: Negative.   Cardiovascular: Negative.   Gastrointestinal: Negative.   Endocrine: Negative.   Genitourinary: Positive for urgency and frequency.  Musculoskeletal: Negative.   Skin: Negative.   Allergic/Immunologic: Negative.   Neurological: Negative.   Hematological: Negative.   Psychiatric/Behavioral: Positive for confusion and decreased concentration. The patient is nervous/anxious.        Objective:   Physical Exam  General: Alert and oriented x 3, No apparent distress.  HEENT: Head is normocephalic, atraumatic, PERRLA, EOMI, sclera anicteric, oral mucosa pink and moist, dentition intact, ext ear canals clear,  Neck: Supple without JVD or lymphadenopathy  Heart: Reg rate and rhythm. No murmurs rubs or gallops  Chest: CTA bilaterally without wheezes, rales, or rhonchi; no distress  Abdomen: Soft, non-tender, non-distended, bowel sounds positive.  Extremities: No clubbing, cyanosis, or edema. Pulses are 2+  Skin: Clean and intact without signs of breakdown  Neuro: Oriented to date, place, reason. attention and focus much improved. She is often able to stop herself before she goes too far and responds to my steering of the conversation as well. Anxiety improved today. Cranial nerves 2-12 are intact. Sensory exam is normal. Reflexes are 2+ in all 4's. Fine motor coordination is intact. No tremors. Motor function is grossly 5/5---still perhaps a little weaker on left. Balance improved.  Saw no tremors today Musculoskeletal: Full ROM, No pain with AROM or PROM in the neck, trunk, or extremities. Posture appropriate.  Psych: she remains anxious, appears tense/stressed. Is always pleasant and polite however.    Assessment & Plan:   Assessment:  1. Traumatic brain injury 01/31/15 after fall --SAH, Right frontal contusion, Right middle cranial fossa EDH  -pt with subsequent  cognitive/behavioral deficits post injury  -ongoing issues with attention/focus which are impacting memory and cognitive function in higher level tasks ---stimulant helpful -emotional lability remains an issue likely impacted by her hx of Bipolar.  -She has had difficulties coping with the post injury changes, but she is beginning to demonstrate adjustment and improving coping skills 2. Post traumatic headaches--improved. Sometimes worsen with stress  3. Hx of bipolar disorder. -as above --is having difficulty coping with cognitive changes after her injury. Which is at times can worsen her mood and subsequently cognition.  4. Neurogenic bladder. ?spastic ?polydypsia related   Plan:  1. Follow up Dr. Leonides Cave for coping, anxiety mgt/stress mgt. She is showing improvement with her coping and adjustment skills  2. Continue lamictal, lithium, for her bipolar disorder as she's been doing. Maintain xanax at 0.5mg   bid scheduled with a prn dose if needed.  -continue lamictal at 150mg  qhs to help with mood stability  3. Maintain trazodone to 100mg  qhs.  4. Continue xanax to 0.5mg  TID to help tremors, anxiety.  -hold off on a Nuedexta trial  5. Continue adderall as it has been helpful. Will increase to 20mg  daily, SE permitting - needs to continue working on   organizational tactics, stress mgt, awareness of deficits and limits.  6. Follow up with me in about 2 months. 30 minutes of face to face patient care time were spent during this visit. All questions were encouraged and answered.    [place] in [place]: Raeford RazorLuis Giuffra, MD, pHD [phone]. She actively remains under his care.

## 2016-01-17 ENCOUNTER — Telehealth: Payer: Self-pay | Admitting: Physical Medicine & Rehabilitation

## 2016-01-17 NOTE — Telephone Encounter (Signed)
Please advise 

## 2016-01-17 NOTE — Telephone Encounter (Signed)
Patient is needing a recommendation for a neurologist from Dr. Riley Kill for a friend of hers.  Please call patient at [phone].

## 2016-01-17 NOTE — Telephone Encounter (Signed)
I need to know what type of problem this person is having to recommend the right type of neurologist.

## 2016-01-18 LAB — TOXASSURE SELECT,+ANTIDEPR,UR: PDF: 0

## 2016-01-18 NOTE — Progress Notes (Signed)
Urine drug screen for this encounter is consistent for prescribed medications.   

## 2016-01-18 NOTE — Telephone Encounter (Signed)
Left message to call back with details. 

## 2016-01-24 NOTE — Telephone Encounter (Signed)
If her friend has fibromylalgia, she should probably start with a rheumatologist---Dr. Melvenia Needles might be a good person to start with

## 2016-01-24 NOTE — Telephone Encounter (Signed)
Miss Chasey called back, she says her friend possibly has fibromyalgia or Lupus with burning pain in her legs and arms. She is also having problems with pain in her neck and is unable to sleep. PCP has put the pt on feritin over the last couple of years with her last measurement = 9. She says her PCP does not seem to be able to handle the situation and that is why Miss Mcbeth is reaching out to Dr. Riley Kill for advise on a good neuro doc that her friend should see

## 2016-01-25 NOTE — Telephone Encounter (Signed)
Advised pt per ZS.

## 2016-03-08 ENCOUNTER — Encounter: Payer: Self-pay | Admitting: Physical Medicine & Rehabilitation

## 2016-03-08 ENCOUNTER — Encounter
Payer: PRIVATE HEALTH INSURANCE | Attending: Physical Medicine & Rehabilitation | Admitting: Physical Medicine & Rehabilitation

## 2016-03-08 DIAGNOSIS — G44309 Post-traumatic headache, unspecified, not intractable: Secondary | ICD-10-CM

## 2016-03-08 DIAGNOSIS — S069X2S Unspecified intracranial injury with loss of consciousness of 31 minutes to 59 minutes, sequela: Secondary | ICD-10-CM | POA: Insufficient documentation

## 2016-03-08 DIAGNOSIS — Z5181 Encounter for therapeutic drug level monitoring: Secondary | ICD-10-CM | POA: Diagnosis not present

## 2016-03-08 DIAGNOSIS — S069X0S Unspecified intracranial injury without loss of consciousness, sequela: Secondary | ICD-10-CM

## 2016-03-08 DIAGNOSIS — F3161 Bipolar disorder, current episode mixed, mild: Secondary | ICD-10-CM | POA: Diagnosis not present

## 2016-03-08 DIAGNOSIS — S069X3S Unspecified intracranial injury with loss of consciousness of 1 hour to 5 hours 59 minutes, sequela: Secondary | ICD-10-CM | POA: Diagnosis not present

## 2016-03-08 DIAGNOSIS — I669 Occlusion and stenosis of unspecified cerebral artery: Secondary | ICD-10-CM | POA: Insufficient documentation

## 2016-03-08 DIAGNOSIS — N189 Chronic kidney disease, unspecified: Secondary | ICD-10-CM | POA: Diagnosis not present

## 2016-03-08 DIAGNOSIS — F3170 Bipolar disorder, currently in remission, most recent episode unspecified: Secondary | ICD-10-CM

## 2016-03-08 DIAGNOSIS — F419 Anxiety disorder, unspecified: Secondary | ICD-10-CM | POA: Diagnosis not present

## 2016-03-08 DIAGNOSIS — M810 Age-related osteoporosis without current pathological fracture: Secondary | ICD-10-CM | POA: Insufficient documentation

## 2016-03-08 DIAGNOSIS — F310 Bipolar disorder, current episode hypomanic: Secondary | ICD-10-CM

## 2016-03-08 DIAGNOSIS — Z79899 Other long term (current) drug therapy: Secondary | ICD-10-CM

## 2016-03-08 IMAGING — CT CT HEAD W/O CM
2 series · 16 of 30 positions shown, 18 images · non-contrast
Comparison: CT head January 31, 2015

CLINICAL DATA: Follow-up. History traumatic brain injury with
depressed skull fracture.

EXAM:
CT HEAD WITHOUT CONTRAST
TECHNIQUE: Contiguous axial images were obtained from the base of the skull
through the vertex without intravenous contrast.

[Series 201: head w/o, idose (1) · axial · non-contrast · 0.49mm/px · z∈[+13,+128]mm · 8 of 31 slices shown, 10 images]
[im 4/31  brain]
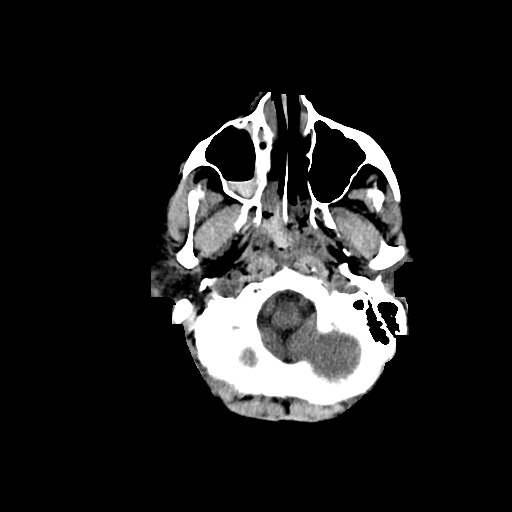
[im 4/31  bone]
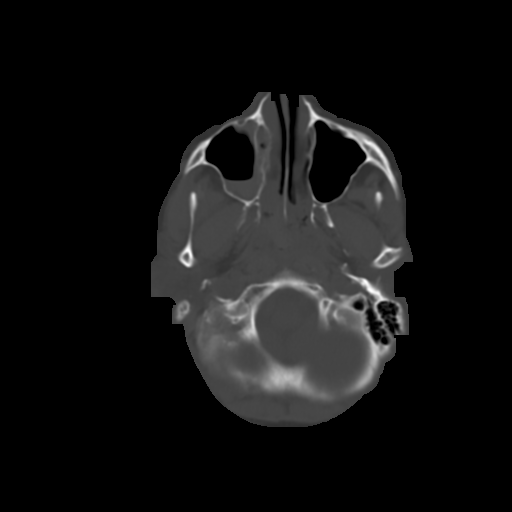
[im 7/31  brain]
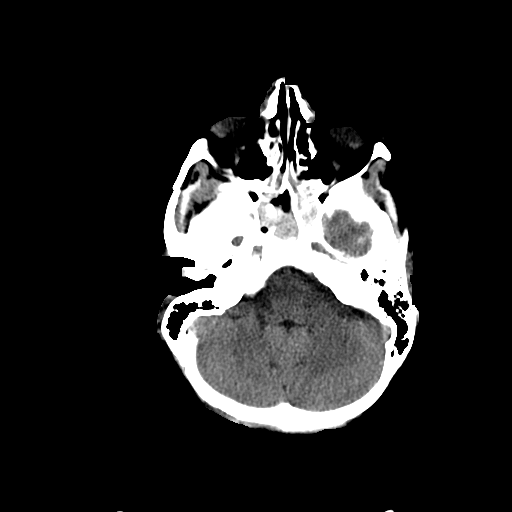
[im 11/31  brain]
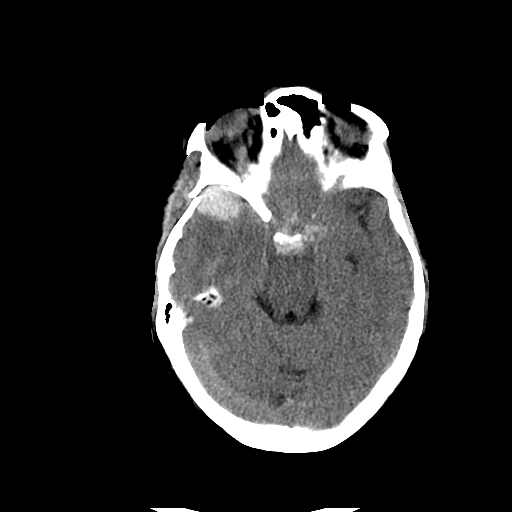
[im 14/31  brain]
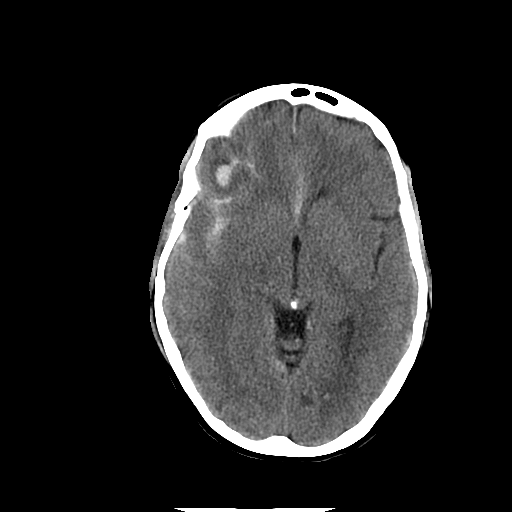
[im 17/31  brain]
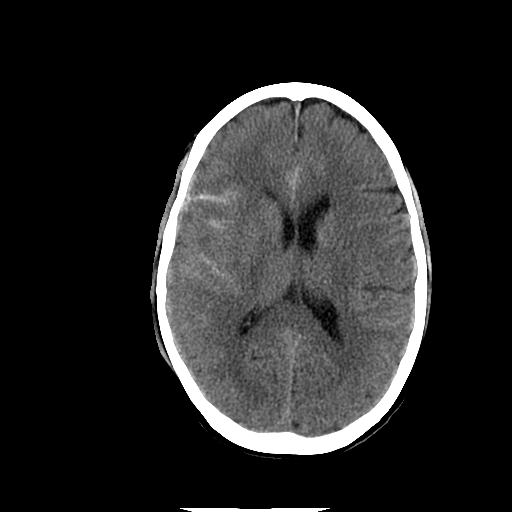
[im 17/31  bone]
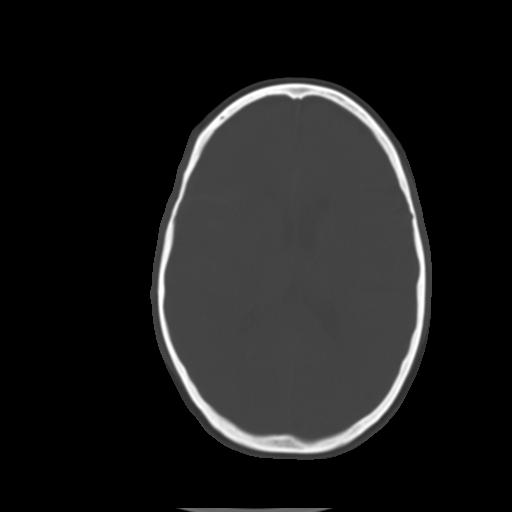
[im 21/31  brain]
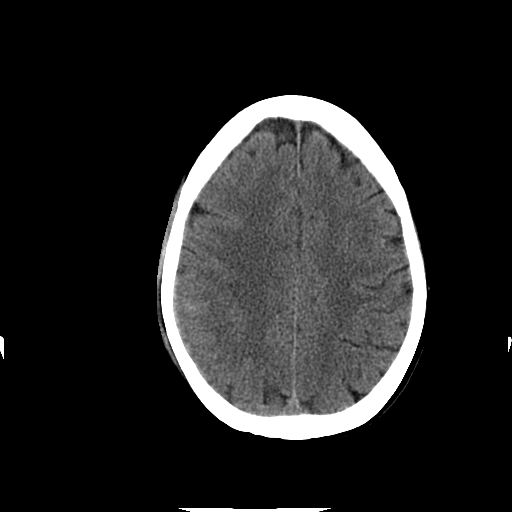
[im 24/31  brain]
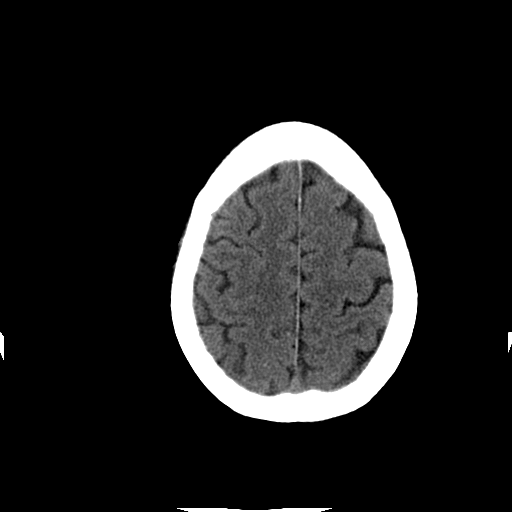
[im 27/31  brain]
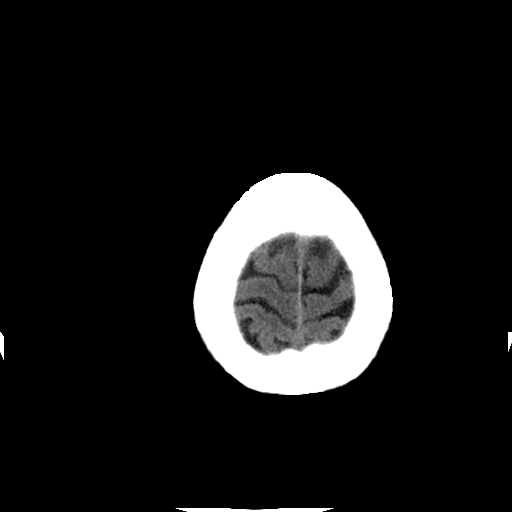

[Series 202: head w/o bone, idose (1) · axial · non-contrast · 0.49mm/px · z∈[+12,+137]mm · 8 of 64 slices shown]
[im 7/64  bone]
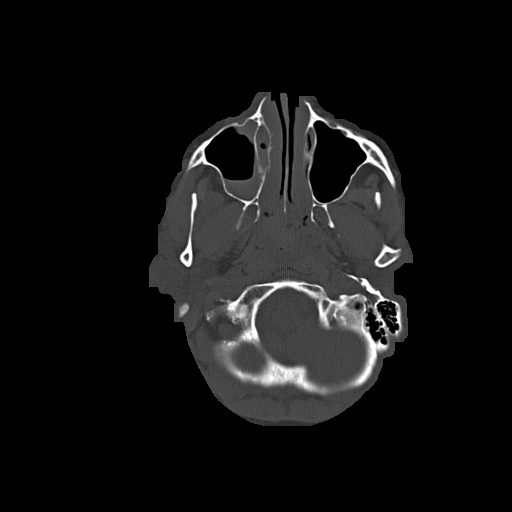
[im 14/64  bone]
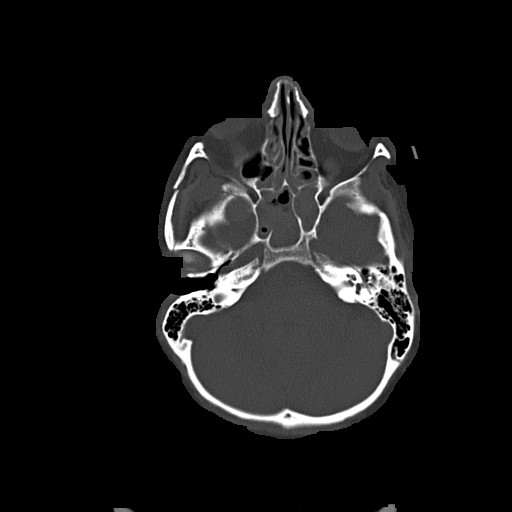
[im 20/64  bone]
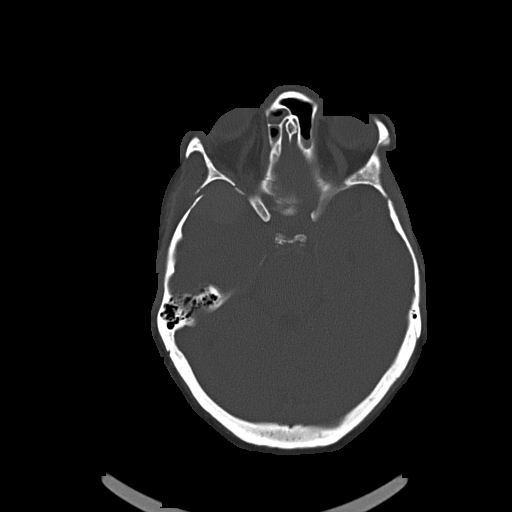
[im 27/64  bone]
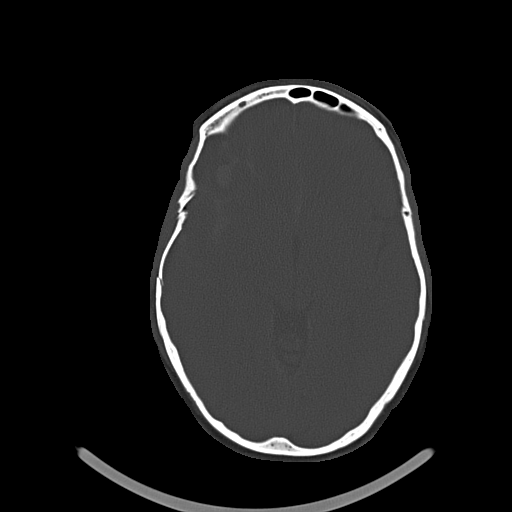
[im 37/64  bone]
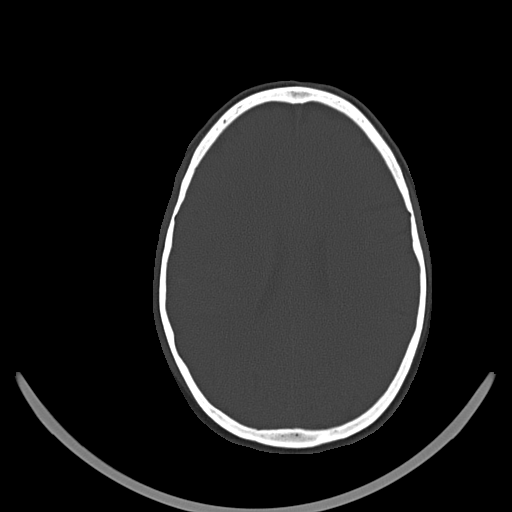
[im 44/64  bone]
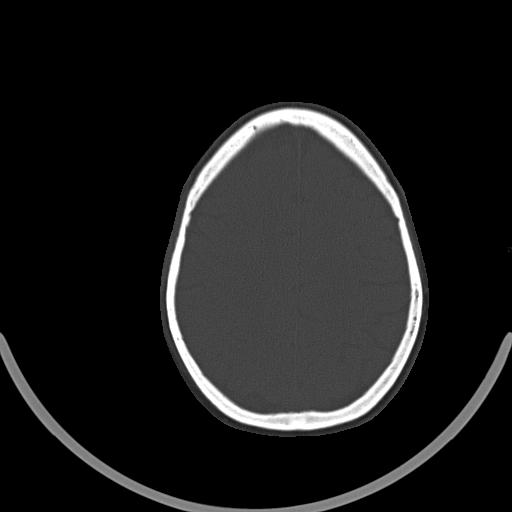
[im 50/64  bone]
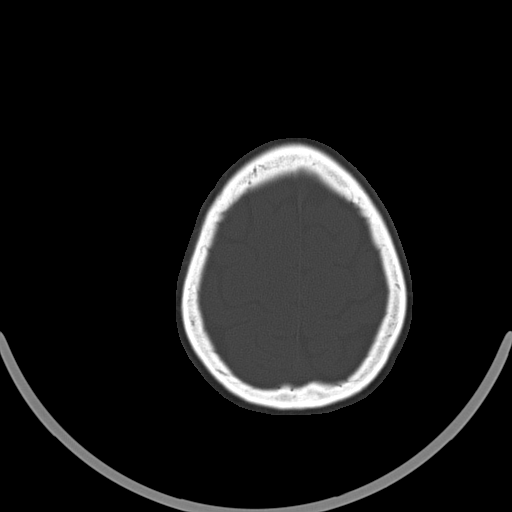
[im 57/64  bone]
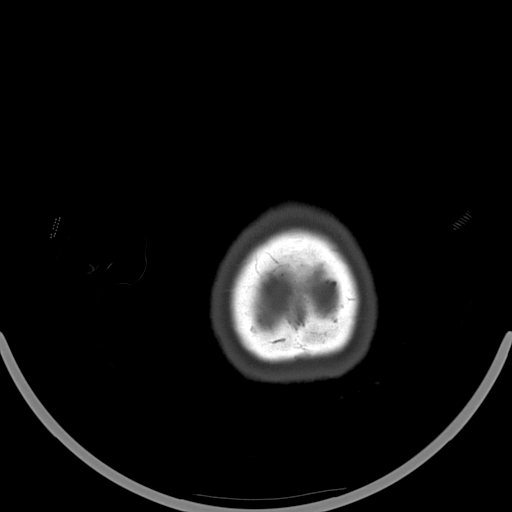

[16 of 30 positions shown; findings below may reference images not displayed]

FINDINGS: 2 cm RIGHT middle cranial fossa lentiform dense probable epidural
hematoma is unchanged. 2 mm RIGHT frontotemporal acute subdural
hematoma with small amount of extra-axial pneumocephalus. 11 mm
RIGHT inferior frontal lobe hemorrhagic contusion with surrounding
low-density vasogenic edema. Moderate amount of subarachnoid blood
on the RIGHT. Mild RIGHT frontotemporal sulcal effacement without
midline shift.

No hydrocephalus. Punctate focus of redistributed blood products
dependent within the occipital horn. Basal cisterns are patent.

Mild RIGHT proptosis. RIGHT skullbase fractures again noted,
including RIGHT zygomatic arch, RIGHT squamosal temporal bone
extending to the greater wing of the sphenoid, and planum
sphenoidale. RIGHT temporal bone fragment is depressed 4 mm, with
over riding bony fragments. Moderate RIGHT frontotemporal scalp
hematoma insinuating into the temporalis muscle.
IMPRESSION: Similar 2 cm RIGHT middle cranial fossa epidural hematoma with 2 mm
RIGHT frontotemporal subdural hematoma and small amount of
extra-axial pneumocephalus. Slightly decreased moderate subarachnoid
hemorrhage.

Stable RIGHT inferior frontal lobe hemorrhagic contusion.

Minimal intraventricular blood products without hydrocephalus.

Acute RIGHT skullbase fractures, including depressed RIGHT temporal
bone fracture.

## 2016-03-08 MED ORDER — AMPHETAMINE-DEXTROAMPHETAMINE 10 MG PO TABS: 20.0000 mg | ORAL_TABLET | Freq: Every day | ORAL | 0 refills | Status: DC

## 2016-03-08 MED ORDER — ALPRAZOLAM 0.5 MG PO TABS: 0.5000 mg | ORAL_TABLET | ORAL | 2 refills | Status: DC

## 2016-03-08 MED ORDER — DIVALPROEX SODIUM 250 MG PO DR TAB: 250.0000 mg | DELAYED_RELEASE_TABLET | Freq: Three times a day (TID) | ORAL | 4 refills | Status: DC

## 2016-03-08 MED ORDER — TRAZODONE HCL 100 MG PO TABS: 100.0000 mg | ORAL_TABLET | Freq: Every evening | ORAL | 3 refills | Status: DC | PRN

## 2016-03-08 NOTE — Progress Notes (Signed)
Subjective:    Patient ID: Laurie Booker, female    DOB: 09/19/1965, 50 y.o.   MRN: [id number]  HPI   Laurie Booker is here in follow up of her TBI. She has had some stresses at home regarding her husband and psychosocial issues which sometimes raise her anxiety.   She had a fall the other day when she became dizzy. She admits to being anxious at the time.   Laurie Booker has been more active at home. She's involved with her family.   She does have some concerns about weight loss since the brain injury but is trying to eat more, take supplements to help limit the loss.     Pain Inventory Average Pain 6 Pain Right Now 4 My pain is stabbing  In the last 24 hours, has pain interfered with the following? General activity 7 Relation with others 6 Enjoyment of life 6 What TIME of day is your pain at its worst? evening Sleep (in general) Fair  Pain is worse with: bending and some activites Pain improves with: medication Relief from Meds: 7  Mobility walk without assistance how many minutes can you walk? 60 ability to climb steps?  yes do you drive?  yes  Function not employed: date last employed 01/29/2014 I need assistance with the following:  meal prep and household duties  Neuro/Psych tremor dizziness depression anxiety  Prior Studies Any changes since last visit?  no  Physicians involved in your care Any changes since last visit?  no   No family history on file. Social History   Social History  . Marital status: Legally Separated    Spouse name: N/A  . Number of children: N/A  . Years of education: N/A   Social History Main Topics  . Smoking status: Never Smoker  . Smokeless tobacco: Never Used  . Alcohol use Yes     Comment: socially  . Drug use: Unknown  . Sexual activity: Not Asked   Other Topics Concern  . None   Social History Narrative  . None   Past Surgical History:  Procedure Laterality Date  . CESAREAN SECTION    . COLONOSCOPY W/ POLYPECTOMY      . DILATION AND CURETTAGE OF UTERUS    . MICROLARYNGOSCOPY WITH CO2 LASER AND EXCISION OF VOCAL CORD LESION N/A 03/19/2015   Procedure: MICROLARYNGOSCOPY WITH CO2 LASER AND EXCISION OF VOCAL CORD LESION;  Surgeon: Christia Readingwight Bates, MD;  Location: [facility] OR;  Service: ENT;  Laterality: N/A;  Micro Direct Laryngoscopy with co2 laser excision of bilateral vocal cord granulomas/jet venturi ventilation  . RHINOPLASTY    . SEPTOPLASTY    . vocal cord surgery     granulomas removed after intubation d/t suicide attempt   Past Medical History:  Diagnosis Date  . Anxiety   . Bipolar disorder (HCC)   . Blood clots in brain    d/t traumatic brain injury Aug 2016  . Chronic kidney disease    follows up with Dr. Lequita HaltLufadeju: nephrologist in [place]. Pt cannot state what is wrong with her kidneys; just that she has issues with them. (numerous renal cysts on 01/2015 CT)  . Depression    behavioral health admission 2009 or 2010  . H/O suicide attempt 2009  . H/O transfusion of packed red blood cells   . Osteoporosis   . Traumatic brain injury [facility](HCC)    with loss of consciousness, intubated, at [facility] August 2016, closed, hemorrhage   BP 133/84 (BP Location: Left Arm, Patient Position:  Sitting, Cuff Size: Normal)   Pulse 71   LMP  (LMP Unknown) Comment: trauma  SpO2 98%   Opioid Risk Score:   Fall Risk Score:  `1  Depression screen PHQ 2/9  Depression screen PHQ 2/9 03/30/2015  Decreased Interest 1  Down, Depressed, Hopeless 2  PHQ - 2 Score 3  Altered sleeping 2  Tired, decreased energy 2  Change in appetite 1  Feeling bad or failure about yourself  2  Trouble concentrating 2  Moving slowly or fidgety/restless 2  Suicidal thoughts 1  PHQ-9 Score 15  Difficult doing work/chores Very difficult   Review of Systems  Constitutional: Positive for unexpected weight change.  Eyes: Negative.   Respiratory: Negative.   Cardiovascular: Negative.   Gastrointestinal: Negative.   Endocrine: Negative.    Genitourinary: Negative.   Allergic/Immunologic: Negative.   Neurological: Positive for dizziness, tremors and headaches.  Psychiatric/Behavioral: Positive for dysphoric mood. The patient is nervous/anxious.        Objective:   Physical Exam  General: Alert and oriented x 3, No apparent distress.  HEENT: Head is normocephalic, atraumatic, PERRLA, EOMI, sclera anicteric, oral mucosa pink and moist, dentition intact, ext ear canals clear,  Neck: Supple without JVD or lymphadenopathy  Heart: Reg rate and rhythm. No murmurs rubs or gallops  Chest: CTA bilaterally without wheezes, rales, or rhonchi; no distress  Abdomen: Soft, non-tender, non-distended, bowel sounds positive.  Extremities: No clubbing, cyanosis, or edema. Pulses are 2+  Skin: Clean and intact without signs of breakdown  Neuro: Oriented to date, place, reason. attention and focus much improved. She is often able to stop herself before she goes too far and responds to my steering of the conversation as well. Anxiety improved today. Cranial nerves 2-12 are intact. Sensory exam is normal. Reflexes are 2+ in all 4's. Fine motor coordination is intact. No tremors. Motor function is grossly 5/5.  No tremors today Musculoskeletal: Full ROM, No pain with AROM or PROM in the neck, trunk, or extremities. Posture appropriate.  Psych: she remains anxious, appears tense/stressed. Is always pleasant and polite however.    Assessment & Plan:   Assessment:  1. Traumatic brain injury 01/31/15 after fall --SAH, Right frontal contusion, Right middle cranial fossa EDH  -pt with subsequent cognitive/behavioral deficits post injury  -ongoing issues with attention/focus which are impacting memory and cognitive function in higher level tasks -she is displaying continued, gradual progress -the adderall is helpful -emotional lability remains an issue likely impacted by her hx of Bipolar.  -she is beginning to demonstrate adjustment and improving  coping skills 2. Post traumatic headaches--improved. Sometimes worsen with stress  3. Hx of bipolar disorder. -as above --is having difficulty coping with cognitive changes after her injury. Which is at times can worsen her mood and subsequently cognition.  4. Neurogenic bladder. ?spastic ?polydypsia related 5. BPPV   Plan:  1. Pursue psychological follow up as available. Consider neuro-psych testing in the late Fall with anticipation of possible vocational re-entry next year 2. Continue lamictal, lithium, for her bipolar disorder as she's been doing. Maintain xanax at 0.5mg  bid scheduled with a prn dose if needed.  -continue lamictal at 150mg  qhs to help with mood stability  -will add low dose depakote 250mg  bid for mood stability. May help with her appetite too. Consider decreasing lamictal if depakote proves helpful 3. Maintain trazodone to 100mg  qhs.  4. Continue xanax to 0.5mg  TID to help tremors, anxiety.   5. Continue adderall at 10-20mg  daily  -  continue working on   Set designer, stress mgt, awareness of deficits and limits.  6. Gaze stabilization exercises should continue on a daily basis 7. Follow up with me in about 2 months. 30 minutes of face to face patient care time were spent during this visit. All questions were encouraged and answered.    Her psychiatrist in [place]: Raeford Razor, MD, pHD [phone]. She actively remains under his care.

## 2016-03-08 NOTE — Patient Instructions (Signed)
CONTINUE FOCUSING ON FINDING "BALANCE" WITH YOUR EMOTIONS AND STRATEGIES TO COPE WITH AGITATION/ANXIETY WHEN IT ARISES   PLEASE CALL ME WITH ANY PROBLEMS OR QUESTIONS [phone])

## 2016-05-08 ENCOUNTER — Encounter: Payer: Self-pay | Admitting: Physical Medicine & Rehabilitation

## 2016-05-08 ENCOUNTER — Encounter
Payer: PRIVATE HEALTH INSURANCE | Attending: Physical Medicine & Rehabilitation | Admitting: Physical Medicine & Rehabilitation

## 2016-05-08 VITALS — BP 141/85 | HR 71 | Resp 14

## 2016-05-08 DIAGNOSIS — S069X9S Unspecified intracranial injury with loss of consciousness of unspecified duration, sequela: Secondary | ICD-10-CM

## 2016-05-08 DIAGNOSIS — Z5181 Encounter for therapeutic drug level monitoring: Secondary | ICD-10-CM | POA: Insufficient documentation

## 2016-05-08 DIAGNOSIS — I669 Occlusion and stenosis of unspecified cerebral artery: Secondary | ICD-10-CM | POA: Insufficient documentation

## 2016-05-08 DIAGNOSIS — M810 Age-related osteoporosis without current pathological fracture: Secondary | ICD-10-CM | POA: Diagnosis not present

## 2016-05-08 DIAGNOSIS — F3161 Bipolar disorder, current episode mixed, mild: Secondary | ICD-10-CM | POA: Insufficient documentation

## 2016-05-08 DIAGNOSIS — H8110 Benign paroxysmal vertigo, unspecified ear: Secondary | ICD-10-CM

## 2016-05-08 DIAGNOSIS — N189 Chronic kidney disease, unspecified: Secondary | ICD-10-CM | POA: Diagnosis not present

## 2016-05-08 DIAGNOSIS — S069X0S Unspecified intracranial injury without loss of consciousness, sequela: Secondary | ICD-10-CM

## 2016-05-08 DIAGNOSIS — F419 Anxiety disorder, unspecified: Secondary | ICD-10-CM | POA: Diagnosis not present

## 2016-05-08 DIAGNOSIS — F310 Bipolar disorder, current episode hypomanic: Secondary | ICD-10-CM

## 2016-05-08 DIAGNOSIS — F319 Bipolar disorder, unspecified: Secondary | ICD-10-CM

## 2016-05-08 DIAGNOSIS — S069X2S Unspecified intracranial injury with loss of consciousness of 31 minutes to 59 minutes, sequela: Secondary | ICD-10-CM | POA: Insufficient documentation

## 2016-05-08 DIAGNOSIS — Z79899 Other long term (current) drug therapy: Secondary | ICD-10-CM | POA: Insufficient documentation

## 2016-05-08 DIAGNOSIS — G44309 Post-traumatic headache, unspecified, not intractable: Secondary | ICD-10-CM | POA: Insufficient documentation

## 2016-05-08 MED ORDER — AMPHETAMINE-DEXTROAMPHETAMINE 10 MG PO TABS: 20.0000 mg | ORAL_TABLET | Freq: Every day | ORAL | 0 refills | Status: DC

## 2016-05-08 MED ORDER — ALPRAZOLAM 0.5 MG PO TABS: 0.5000 mg | ORAL_TABLET | ORAL | 2 refills | Status: DC

## 2016-05-08 NOTE — Patient Instructions (Addendum)
   ADDERALL IS INTENDED TO IMPROVE YOUR ATTENTION AND FOCUS ON  DAYS WHERE YOU PERFORM PURPOSEFUL ACTIVITIES WHICH SHOULD IN TURN IMPROVE MEMORY AS WELL. THESE COGNITIVE DEFICITS ARE A RESULT OF YOUR TRAUMATIC BRAIN INJURY.    WORK ON RE-ACCLIMATION.  ? VOLUNTEER TIME?    PLEASE CALL ME WITH ANY PROBLEMS OR QUESTIONS [phone])

## 2016-05-08 NOTE — Progress Notes (Signed)
Subjective:    Patient ID: Laurie Booker, female    DOB: 10/25/1965, 50 y.o.   MRN: [id number]  HPI   [place] associated functional deficits. She continues to struggle with some perceptions her family has regarding her injury and medications. They are concerned about perceived weight loss. She recently travelled back to [place] to visit them. Adderal remains effective for attention. Xanax helps smooth her out a bit when she takes it.   She started depakote as rx'ed but backed off due to some of the concerns above. She hadn't noticed a big difference prior to stopping but doesn't feel that she gave it a chance.   She is considering doing some volunteer work.     Pain Inventory Average Pain 5 Pain Right Now 4 My pain is aching  In the last 24 hours, has pain interfered with the following? General activity 6 Relation with others 3 Enjoyment of life 4 What TIME of day is your pain at its worst? morning Sleep (in general) Fair  Pain is worse with: some activites Pain improves with: medication Relief from Meds: 8  Mobility walk without assistance use a cane ability to climb steps?  yes do you drive?  yes  Function disabled: date disabled 01/30/2015 I need assistance with the following:  household duties  Neuro/Psych tremor dizziness confusion depression anxiety  Prior Studies Any changes since last visit?  no  Physicians involved in your care Any changes since last visit?  no   No family history on file. Social History   Social History  . Marital status: Legally Separated    Spouse name: N/A  . Number of children: N/A  . Years of education: N/A   Social History Main Topics  . Smoking status: Never Smoker  . Smokeless tobacco: Never Used  . Alcohol use Yes     Comment: socially  . Drug use: Unknown  . Sexual activity: Not Asked   Other Topics Concern  . None   Social History Narrative  . None   Past Surgical History:    Procedure Laterality Date  . CESAREAN SECTION    . COLONOSCOPY W/ POLYPECTOMY    . DILATION AND CURETTAGE OF UTERUS    . MICROLARYNGOSCOPY WITH CO2 LASER AND EXCISION OF VOCAL CORD LESION N/A 03/19/2015   Procedure: MICROLARYNGOSCOPY WITH CO2 LASER AND EXCISION OF VOCAL CORD LESION;  Surgeon: Christia Readingwight Bates, MD;  Location: [facility] OR;  Service: ENT;  Laterality: N/A;  Micro Direct Laryngoscopy with co2 laser excision of bilateral vocal cord granulomas/jet venturi ventilation  . RHINOPLASTY    . SEPTOPLASTY    . vocal cord surgery     granulomas removed after intubation d/t suicide attempt   Past Medical History:  Diagnosis Date  . Anxiety   . Bipolar disorder (HCC)   . Blood clots in brain    d/t traumatic brain injury Aug 2016  . Chronic kidney disease    follows up with Dr. Lequita HaltLufadeju: nephrologist in [place]. Pt cannot state what is wrong with her kidneys; just that she has issues with them. (numerous renal cysts on 01/2015 CT)  . Depression    behavioral health admission 2009 or 2010  . H/O suicide attempt 2009  . H/O transfusion of packed red blood cells   . Osteoporosis   . Traumatic brain injury [facility](HCC)    with loss of consciousness, intubated, at [facility] August 2016, closed, hemorrhage   BP (!) 141/85  Pulse 71   Resp 14   LMP  (LMP Unknown) Comment: trauma  SpO2 98%   Opioid Risk Score:   Fall Risk Score:  `1  Depression screen PHQ 2/9  Depression screen PHQ 2/9 03/30/2015  Decreased Interest 1  Down, Depressed, Hopeless 2  PHQ - 2 Score 3  Altered sleeping 2  Tired, decreased energy 2  Change in appetite 1  Feeling bad or failure about yourself  2  Trouble concentrating 2  Moving slowly or fidgety/restless 2  Suicidal thoughts 1  PHQ-9 Score 15  Difficult doing work/chores Very difficult    Review of Systems  All other systems reviewed and are negative.      Objective:   Physical Exam  General: NAD.  HEENT:PERRL,  Neck:Supple    Heart:RRR  Chest:CTA   Abdomen:Soft, non-tender, non-distended, bowel sounds +  Extremities:No edema Skin:Clean and intact   Neuro:Oriented to date, place, reason. attention and focus much improved. She is often able to stop herself before she goes too far and responds to my steering of the conversation as well. Anxiety improved today. Cranial nerves 2-12 are intact. Sensory exam is normal. Reflexes are 2+ in all 4's. Fine motor coordination is intact. No tremors. Motor function is grossly 5/5. No tremors today Musculoskeletal:Full ROM, No pain with AROM or PROM in the neck, trunk, or extremities. Posture appropriate.  Psych:less anxious. Pleasant as always.   Assessment & Plan:  1. Traumatic brain injury 01/31/15 after fall --SAH, Right frontal contusion, Right middle cranial fossa EDH  -pt with subsequent cognitive/behavioral deficits post injury  -ongoing issues with attention/focus which are impacting memory and cognitive function in higher level tasks -she is displaying continued, gradual progress -her expectations and perceptions are still obstacle -emotional lability remains an issue likely impacted by her hx of Bipolar.  -she is beginning to demonstrate adjustment and improving coping skills 2. Post traumatic headaches--improved. Sometimes worsen with stress  3. Hx of bipolar disorder. -as above --is having difficulty coping with cognitive changes after her injury. Which is at times can worsen her mood and subsequently cognition.  4. Neurogenic bladder. resolved   5. BPPV   Plan:  1. 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 Lamictal, lithium, for her bipolar disorder as she's been doing.  Continue xanax at 0.5mg  bid scheduled with a prn dose if needed.  -will consider revisiting vpa---hold for now. 3. Trazodone to 100mg  qhs.  4. Continue xanax to 0.5mg  TID to help tremors, anxiety.   5. Continue  adderall at 10-20mg  daily depending upon need -discussedorganizational tactics, stress mgt, awareness of deficits and limits.   -reviewed at length the patient's perceptions of herself and others.  6. Gaze stabilization exercises should continue on a daily basis 7. Follow up with me in about 2 months. 30 minutes of face to face patient care time were spent during this visit. All questions were encouraged and answered.    [place] Raeford RazorLuis Giuffra, MD, pHD [phone]. She actively remains under his care.

## 2016-05-09 NOTE — Addendum Note (Signed)
Addended by: Jerry CarasHAYNES, Sharlyne Koeneman I on: 05/09/2016 09:36 AM   Modules accepted: Orders

## 2016-05-09 NOTE — Addendum Note (Signed)
Addended by: Jerry CarasHAYNES, Emon Miggins I on: 05/09/2016 09:38 AM   Modules accepted: Orders

## 2016-05-16 LAB — TOXASSURE SELECT,+ANTIDEPR,UR

## 2016-06-03 ENCOUNTER — Other Ambulatory Visit: Payer: Self-pay | Admitting: Physical Medicine & Rehabilitation

## 2016-06-03 DIAGNOSIS — F3170 Bipolar disorder, currently in remission, most recent episode unspecified: Secondary | ICD-10-CM

## 2016-06-03 DIAGNOSIS — G44309 Post-traumatic headache, unspecified, not intractable: Secondary | ICD-10-CM

## 2016-06-03 DIAGNOSIS — S069X0S Unspecified intracranial injury without loss of consciousness, sequela: Secondary | ICD-10-CM

## 2016-06-03 DIAGNOSIS — S069X3S Unspecified intracranial injury with loss of consciousness of 1 hour to 5 hours 59 minutes, sequela: Secondary | ICD-10-CM

## 2016-07-10 ENCOUNTER — Encounter: Payer: PRIVATE HEALTH INSURANCE | Admitting: Physical Medicine & Rehabilitation

## 2016-07-26 ENCOUNTER — Encounter: Payer: Self-pay | Admitting: Physical Medicine & Rehabilitation

## 2016-07-26 ENCOUNTER — Encounter
Payer: PRIVATE HEALTH INSURANCE | Attending: Physical Medicine & Rehabilitation | Admitting: Physical Medicine & Rehabilitation

## 2016-07-26 VITALS — BP 129/89 | HR 76 | Resp 14

## 2016-07-26 DIAGNOSIS — M810 Age-related osteoporosis without current pathological fracture: Secondary | ICD-10-CM | POA: Diagnosis not present

## 2016-07-26 DIAGNOSIS — S069X9S Unspecified intracranial injury with loss of consciousness of unspecified duration, sequela: Secondary | ICD-10-CM

## 2016-07-26 DIAGNOSIS — G44309 Post-traumatic headache, unspecified, not intractable: Secondary | ICD-10-CM

## 2016-07-26 DIAGNOSIS — I669 Occlusion and stenosis of unspecified cerebral artery: Secondary | ICD-10-CM | POA: Diagnosis not present

## 2016-07-26 DIAGNOSIS — S069X0S Unspecified intracranial injury without loss of consciousness, sequela: Secondary | ICD-10-CM

## 2016-07-26 DIAGNOSIS — H8110 Benign paroxysmal vertigo, unspecified ear: Secondary | ICD-10-CM | POA: Diagnosis not present

## 2016-07-26 DIAGNOSIS — E232 Diabetes insipidus: Secondary | ICD-10-CM

## 2016-07-26 DIAGNOSIS — S069X2S Unspecified intracranial injury with loss of consciousness of 31 minutes to 59 minutes, sequela: Secondary | ICD-10-CM | POA: Insufficient documentation

## 2016-07-26 DIAGNOSIS — Z5181 Encounter for therapeutic drug level monitoring: Secondary | ICD-10-CM | POA: Insufficient documentation

## 2016-07-26 DIAGNOSIS — F3161 Bipolar disorder, current episode mixed, mild: Secondary | ICD-10-CM | POA: Insufficient documentation

## 2016-07-26 DIAGNOSIS — N189 Chronic kidney disease, unspecified: Secondary | ICD-10-CM | POA: Diagnosis not present

## 2016-07-26 DIAGNOSIS — Z79899 Other long term (current) drug therapy: Secondary | ICD-10-CM | POA: Diagnosis not present

## 2016-07-26 DIAGNOSIS — F419 Anxiety disorder, unspecified: Secondary | ICD-10-CM | POA: Diagnosis not present

## 2016-07-26 MED ORDER — ATOMOXETINE HCL 10 MG PO CAPS: 10.0000 mg | ORAL_CAPSULE | Freq: Every day | ORAL | 2 refills | Status: DC

## 2016-07-26 NOTE — Progress Notes (Signed)
Subjective:    Patient ID: Laurie Booker, female    DOB: 11-04-1965, 51 y.o.   MRN: [id number]  HPI  Laurie Booker is here in follow up of her TBI and associated symptoms. I received a call from her urologist who was concerned about urinary frequency and potential neurological cause. Apparently she has been diagnosed with DI by a nephrologist. She saw her psychiatrist who felt that it was likely related to her lithium (level was normal). He recommended low dose HCTZ to help.   She still uses her trazodone at night for sleep. she is rarely using the tramadol. More often she uses tylenol. She has not been taking the adderall consistently   She was asked to do some chart review/auditing but is hesitant because of having to travel.   Pain Inventory Average Pain 5 Pain Right Now 0 My pain is sharp  In the last 24 hours, has pain interfered with the following? General activity 3 Relation with others 3 Enjoyment of life 3 What TIME of day is your pain at its worst? n/a Sleep (in general) Fair  Pain is worse with: n/a Pain improves with: medication Relief from Meds: 8  Mobility walk without assistance ability to climb steps?  yes do you drive?  yes  Function disabled: date disabled 02/09/15 I need assistance with the following:  household duties and shopping  Neuro/Psych tremor depression anxiety  Prior Studies Any changes since last visit?  no  Physicians involved in your care Any changes since last visit?  no   No family history on file. Social History   Social History  . Marital status: Legally Separated    Spouse name: N/A  . Number of children: N/A  . Years of education: N/A   Social History Main Topics  . Smoking status: Never Smoker  . Smokeless tobacco: Never Used  . Alcohol use Yes     Comment: socially  . Drug use: Unknown  . Sexual activity: Not Asked   Other Topics Concern  . None   Social History Narrative  . None   Past Surgical History:    Procedure Laterality Date  . CESAREAN SECTION    . COLONOSCOPY W/ POLYPECTOMY    . DILATION AND CURETTAGE OF UTERUS    . MICROLARYNGOSCOPY WITH CO2 LASER AND EXCISION OF VOCAL CORD LESION N/A 03/19/2015   Procedure: MICROLARYNGOSCOPY WITH CO2 LASER AND EXCISION OF VOCAL CORD LESION;  Surgeon: Christia Reading, MD;  Location: [facility] OR;  Service: ENT;  Laterality: N/A;  Micro Direct Laryngoscopy with co2 laser excision of bilateral vocal cord granulomas/jet venturi ventilation  . RHINOPLASTY    . SEPTOPLASTY    . vocal cord surgery     granulomas removed after intubation d/t suicide attempt   Past Medical History:  Diagnosis Date  . Anxiety   . Bipolar disorder (HCC)   . Blood clots in brain    d/t traumatic brain injury Aug 2016  . Chronic kidney disease    follows up with Dr. Lequita Halt: nephrologist in [place]. Pt cannot state what is wrong with her kidneys; just that she has issues with them. (numerous renal cysts on 01/2015 CT)  . Depression    behavioral health admission 2009 or 2010  . H/O suicide attempt 2009  . H/O transfusion of packed red blood cells   . Osteoporosis   . Tra[facility] injury [facility])    with loss of consciousness, intubated, at [facility] August 2016, closed, hemorrhage   BP 129/89  Pulse 76   Resp 14   LMP  (LMP Unknown) Comment: trauma  SpO2 98%   Opioid Risk Score:   Fall Risk Score:  `1  Depression screen PHQ 2/9  Depression screen PHQ 2/9 03/30/2015  Decreased Interest 1  Down, Depressed, Hopeless 2  PHQ - 2 Score 3  Altered sleeping 2  Tired, decreased energy 2  Change in appetite 1  Feeling bad or failure about yourself  2  Trouble concentrating 2  Moving slowly or fidgety/restless 2  Suicidal thoughts 1  PHQ-9 Score 15  Difficult doing work/chores Very difficult    Review of Systems  Constitutional: Negative.   HENT: Negative.   Eyes: Negative.   Respiratory: Negative.   Cardiovascular: Negative.   Gastrointestinal: Negative.   Endocrine:  Negative.   Genitourinary: Negative.   Musculoskeletal: Negative.   Skin: Negative.   Allergic/Immunologic: Negative.   Neurological: Negative.   Hematological: Negative.   Psychiatric/Behavioral: Negative.   All other systems reviewed and are negative.      Objective:   Physical Exam  General: NAD.  HEENT:PERRL,  Neck:Supple    Heart:RRR  Chest:CTA  Abdomen:Soft, non-tender, non-distended, bowel sounds +  Extremities:No edema Skin:Clean and intact   Neuro:Oriented to date, place, reason. attention and focus much improved. She is often able to stop herself before she goes too far and responds to my steering of the conversation as well. Anxiety improved today. Cranial nerves 2-12 are intact. Sensory exam is normal. Reflexes are 2+ in all 4's. Fine motor coordination is intact. No tremors. Motor function is grossly 5/5. No tremors today Musculoskeletal:Full ROM, No pain with AROM or PROM in the neck, trunk, or extremities. Posture appropriate.  Psych:less anxious. Pleasant as always.   Assessment & Plan:  1. Traumatic brain injury 01/31/15 after fall --SAH, Right frontal contusion, Right middle cranial fossa EDH  -pt with subsequent cognitive/behavioral deficits post injury  -improving awareness and cognition. Attention and anxiety are still obstacles 2. Post traumatic headaches--improved. Sometimes worsen with stress  3. Hx of bipolar disorder 4. Neurogenic bladder. resolved   5. BPPV 6. Nephrogenic diabetes insipidus    Plan:  1. 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 Lamictal, lithium, for her bipolar disorder as she's been doing.  Continue xanax at 0.5mg  bid scheduled with a prn dose if needed.  -can consider revisiting vpa at some point.  3. Trazodone to 100mg  qhs.  4. Continue xanax to 0.5mg  TID to help tremors, anxiety.  5. Continue will stop  adderall and try strattera 10mg  daily #30. Titrate as necessary. She will call in about a week to let me know about progress -continue withorganizational tactics, stress mgt, awareness of deficits and limits.              -reviewed at length the patient's perceptions of herself and others.  6. Gaze stabilization exercises should continue on a daily basis 7. Follow up with me in about 2 months. 30 minutes of face to face patient care time were spent during this visit. All questions were encouraged and answered.  8. Diabetes insipidus: discussed relative fluid rationing. Use of gum/candy too.  -she should discuss the use of HCTZ for NDI which can be of use also  Thirty minutes of face to face patient care time were spent during this visit. All questions were encouraged and answered.  Follow up in 2 months. Greater than 50% of time during this encounter was spent counseling patient/family in regard to behavior, medications, diabetes insipidus/urinary incontinence.     Her psychiatrist in [place]: Raeford Razor, MD, pHD [phone]. She actively remains under his care.

## 2016-07-26 NOTE — Patient Instructions (Addendum)
PLEASE FEEL FREE TO CALL OUR OFFICE WITH ANY PROBLEMS OR QUESTIONS [phone])    KEEP TRACK OF YOUR FLUIDS EACH DAY. TRY TO "RATION" OUT HOW MUCH YOU DRINK AND SET LIMITS.

## 2016-09-20 ENCOUNTER — Encounter
Payer: PRIVATE HEALTH INSURANCE | Attending: Physical Medicine & Rehabilitation | Admitting: Physical Medicine & Rehabilitation

## 2016-09-20 ENCOUNTER — Encounter: Payer: Self-pay | Admitting: Physical Medicine & Rehabilitation

## 2016-09-20 VITALS — BP 116/72 | HR 65

## 2016-09-20 DIAGNOSIS — F419 Anxiety disorder, unspecified: Secondary | ICD-10-CM | POA: Diagnosis not present

## 2016-09-20 DIAGNOSIS — F3161 Bipolar disorder, current episode mixed, mild: Secondary | ICD-10-CM | POA: Insufficient documentation

## 2016-09-20 DIAGNOSIS — N189 Chronic kidney disease, unspecified: Secondary | ICD-10-CM | POA: Diagnosis not present

## 2016-09-20 DIAGNOSIS — Z79899 Other long term (current) drug therapy: Secondary | ICD-10-CM | POA: Insufficient documentation

## 2016-09-20 DIAGNOSIS — I669 Occlusion and stenosis of unspecified cerebral artery: Secondary | ICD-10-CM | POA: Insufficient documentation

## 2016-09-20 DIAGNOSIS — M810 Age-related osteoporosis without current pathological fracture: Secondary | ICD-10-CM | POA: Diagnosis not present

## 2016-09-20 DIAGNOSIS — S069X0S Unspecified intracranial injury without loss of consciousness, sequela: Secondary | ICD-10-CM | POA: Insufficient documentation

## 2016-09-20 DIAGNOSIS — G44309 Post-traumatic headache, unspecified, not intractable: Secondary | ICD-10-CM | POA: Insufficient documentation

## 2016-09-20 DIAGNOSIS — E232 Diabetes insipidus: Secondary | ICD-10-CM

## 2016-09-20 DIAGNOSIS — Z5181 Encounter for therapeutic drug level monitoring: Secondary | ICD-10-CM | POA: Diagnosis not present

## 2016-09-20 DIAGNOSIS — S069X9S Unspecified intracranial injury with loss of consciousness of unspecified duration, sequela: Secondary | ICD-10-CM

## 2016-09-20 DIAGNOSIS — H8110 Benign paroxysmal vertigo, unspecified ear: Secondary | ICD-10-CM

## 2016-09-20 DIAGNOSIS — S069X2S Unspecified intracranial injury with loss of consciousness of 31 minutes to 59 minutes, sequela: Secondary | ICD-10-CM | POA: Diagnosis present

## 2016-09-20 MED ORDER — ATOMOXETINE HCL 18 MG PO CAPS: 18.0000 mg | ORAL_CAPSULE | Freq: Every day | ORAL | 5 refills | Status: DC

## 2016-09-20 NOTE — Patient Instructions (Signed)
PLEASE FEEL FREE TO CALL OUR OFFICE WITH ANY PROBLEMS OR QUESTIONS ([phone])      

## 2016-09-20 NOTE — Progress Notes (Signed)
Subjective:    Patient ID: Laurie Booker, female    DOB: April 15, 1966, 51 y.o.   MRN: [id number]  HPI   Laurie Booker is here in follow up of her TBI and associated cognitive and behavioral deficits. She is still frustrated by her ongoing memory and cognitive deficits. She hasn't fully adopted compensatory strategies either. She still tries to "wing" things and will tend to forget information and direction at times because of this.   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 She finds it "overwhelms" her at times. She states that nobody has commented negatively on the quality of her work  Her psychiatrist has increased her lamictal and decreased her lithium (because of the DI).   She is using the strattera which seems to help. It is not causing her to be "jittery"   Her anxiety remains an issue and still interferes with personal relations and with her cognitive performance too. She feels that others are bothered by her, and sees herself as dysfunctional as well.      Pain Inventory Average Pain 5 Pain Right Now 3 My pain is intermittent  In the last 24 hours, has pain interfered with the following? General activity 3 Relation with others 3 Enjoyment of life 3 What TIME of day is your pain at its worst? daytime Sleep (in general) Fair  Pain is worse with: bending Pain improves with: rest and medication Relief from Meds: 7  Mobility walk without assistance use a cane ability to climb steps?  yes do you drive?  yes  Function not employed: date last employed 2016 I need assistance with the following:  household duties and shopping  Neuro/Psych tremor dizziness confusion depression anxiety  Prior Studies Any changes since last visit?  no  Physicians involved in your care Any changes since last visit?  no   No family history on file. Social History   Social History  . Marital status: Legally Separated   Spouse name: N/A  . Number of children: N/A  . Years of education: N/A   Social History Main Topics  . Smoking status: Never Smoker  . Smokeless tobacco: Never Used  . Alcohol use Yes     Comment: socially  . Drug use: Unknown  . Sexual activity: Not on file   Other Topics Concern  . Not on file   Social History Narrative  . No narrative on file   Past Surgical History:  Procedure Laterality Date  . CESAREAN SECTION    . COLONOSCOPY W/ POLYPECTOMY    . DILATION AND CURETTAGE OF UTERUS    . MICROLARYNGOSCOPY WITH CO2 LASER AND EXCISION OF VOCAL CORD LESION N/A 03/19/2015   Procedure: MICROLARYNGOSCOPY WITH CO2 LASER AND EXCISION OF VOCAL CORD LESION;  Surgeon: Christia Reading, MD;  Location: [facility] OR;  Service: ENT;  Laterality: N/A;  Micro Direct Laryngoscopy with co2 laser excision of bilateral vocal cord granulomas/jet venturi ventilation  . RHINOPLASTY    . SEPTOPLASTY    . vocal cord surgery     granulomas removed after intubation d/t suicide attempt   Past Medical History:  Diagnosis Date  . Anxiety   . Bipolar disorder (HCC)   . Blood clots in brain    d/t traumatic brain injury Aug 2016  . Chronic kidney disease    follows up with Dr. Lequita Halt: nephrologist in [place]. Pt cannot state what is wrong with her kidneys; just that she has issues with  them. (numerous renal cysts on 01/2015 CT)  . Depression    behavioral health admission 2009 or 2010  . H/O suicide attempt 2009  . H/O transfusion of packed red blood cells   . Osteoporosis   . Traumatic brain injury [facility](HCC)    with loss of consciousness, intubated, at [facility] August 2016, closed, hemorrhage   LMP  (LMP Unknown) Comment: trauma  Opioid Risk Score:   Fall Risk Score:  `1  Depression screen PHQ 2/9  Depression screen PHQ 2/9 03/30/2015  Decreased Interest 1  Down, Depressed, Hopeless 2  PHQ - 2 Score 3  Altered sleeping 2  Tired, decreased energy 2  Change in appetite 1  Feeling bad or failure about yourself  2    Trouble concentrating 2  Moving slowly or fidgety/restless 2  Suicidal thoughts 1  PHQ-9 Score 15  Difficult doing work/chores Very difficult    Review of Systems  Constitutional: Negative.   HENT: Negative.   Eyes: Negative.   Respiratory: Negative.   Cardiovascular: Negative.   Gastrointestinal: Negative.   Endocrine: Negative.   Genitourinary: Negative.   Musculoskeletal: Negative.   Skin: Negative.   Allergic/Immunologic: Negative.   Neurological: Negative.   Hematological: Negative.   Psychiatric/Behavioral: Negative.   All other systems reviewed and are negative.      Objective:   Physical Exam General: NAD.  HEENT:PERRL,  Neck:Supple  Heart:RRR Chest:CTA Abdomen:Soft, non-tender, non-distended, bowel sounds + Extremities:No edema Skin:Clean and intact  Neuro:Oriented x 3. movess all 4's. Normal sensation.  Musculoskeletal:Full ROM, No pain with AROM or PROM in the neck, trunk, or extremities. Posture appropriate.  Psych:quite anxious at times but pleasant. Had to redirect at times today as she was tangential and lost focus frequently. Did have reasonable insight and awareness.    Assessment & Plan:  1. Traumatic brain injury 01/31/15 after fall --SAH, Right frontal contusion, Right middle cranial fossa EDH  -pt with subsequent cognitive/behavioral deficits post injury  -improving awareness and cognition. Attention and anxiety are still obstacles 2. Post traumatic headaches--improved. Sometimes worsen with stress  3. Hx of bipolar disorder 4. Neurogenic bladder. resolved 5. BPPV 6. Nephrogenic diabetes insipidus    Plan:  1. Will make neuropsychology referral for testing and to also address bipolar/anxiety/coping issues 2. Lamictal, lithium, for her bipolar disorder. Continue xanax at 0.5mg  bid scheduled with a prn dose if needed.  -can consider revisiting vpa at some point.  3. Trazodone to 100mg  qhs.  4. Continue xanax to 0.5mg   TID to help tremors, anxiety.  5. Increase strattera to 18mg  #30. Titrate further as necessary.   -continue withorganizational tactics, stress mgt, awareness of deficits and limits---needs to work on skills and strategies  -again perceptions and behavioral issues are prominent 6. Gaze stabilization exercises should continue on a daily basis 7. Follow up with me in about 2 months. 30minutes of face to face patient care time were spent during this visit. All questions were encouraged and answered.  8. Diabetes insipidus: lithium reduced to help with this  -continue per endocrine.         [place] Raeford RazorLuis Giuffra, MD, pHD [phone]. She actively remains under his care.

## 2016-11-07 ENCOUNTER — Telehealth: Payer: Self-pay

## 2016-11-14 ENCOUNTER — Other Ambulatory Visit: Payer: Self-pay

## 2016-11-14 ENCOUNTER — Encounter: Payer: PRIVATE HEALTH INSURANCE | Attending: Physical Medicine & Rehabilitation | Admitting: Psychology

## 2016-11-14 DIAGNOSIS — F419 Anxiety disorder, unspecified: Secondary | ICD-10-CM | POA: Diagnosis not present

## 2016-11-14 DIAGNOSIS — G44309 Post-traumatic headache, unspecified, not intractable: Secondary | ICD-10-CM | POA: Insufficient documentation

## 2016-11-14 DIAGNOSIS — S069X9S Unspecified intracranial injury with loss of consciousness of unspecified duration, sequela: Secondary | ICD-10-CM

## 2016-11-14 DIAGNOSIS — Z79899 Other long term (current) drug therapy: Secondary | ICD-10-CM | POA: Insufficient documentation

## 2016-11-14 DIAGNOSIS — Z5181 Encounter for therapeutic drug level monitoring: Secondary | ICD-10-CM | POA: Insufficient documentation

## 2016-11-14 DIAGNOSIS — S069X0S Unspecified intracranial injury without loss of consciousness, sequela: Secondary | ICD-10-CM | POA: Diagnosis not present

## 2016-11-14 DIAGNOSIS — S069X2S Unspecified intracranial injury with loss of consciousness of 31 minutes to 59 minutes, sequela: Secondary | ICD-10-CM | POA: Insufficient documentation

## 2016-11-14 DIAGNOSIS — I669 Occlusion and stenosis of unspecified cerebral artery: Secondary | ICD-10-CM | POA: Diagnosis not present

## 2016-11-14 DIAGNOSIS — F3161 Bipolar disorder, current episode mixed, mild: Secondary | ICD-10-CM | POA: Diagnosis not present

## 2016-11-14 DIAGNOSIS — N189 Chronic kidney disease, unspecified: Secondary | ICD-10-CM | POA: Insufficient documentation

## 2016-11-14 DIAGNOSIS — H8110 Benign paroxysmal vertigo, unspecified ear: Secondary | ICD-10-CM

## 2016-11-14 DIAGNOSIS — M810 Age-related osteoporosis without current pathological fracture: Secondary | ICD-10-CM | POA: Diagnosis not present

## 2016-11-14 DIAGNOSIS — F310 Bipolar disorder, current episode hypomanic: Secondary | ICD-10-CM

## 2016-11-14 NOTE — Telephone Encounter (Addendum)
Patient came to window today, requesting be placed back onto her Adderal, states since she was taken off feels less jittery with the Strattera but has absolutely no energy.  Please advise

## 2016-11-14 NOTE — Telephone Encounter (Signed)
The strattera still can be increased quite a bit. Can increase to the next dose of 25mg  daily #30, 0rf.

## 2016-11-15 MED ORDER — ATOMOXETINE HCL 25 MG PO CAPS: 25.0000 mg | ORAL_CAPSULE | Freq: Every day | ORAL | 0 refills | Status: DC

## 2016-11-15 NOTE — Telephone Encounter (Signed)
Called patient and informed her that we will increase the dosage of the Strattera to 25mg , stated to me that is very frustrated that she can not go back on to the Adderal medication and the Strattera is doing nothing for energy but will try the increase, new medication has been ordered with current dosage instructions

## 2016-11-16 ENCOUNTER — Telehealth: Payer: Self-pay

## 2016-11-16 NOTE — Telephone Encounter (Addendum)
Patient called today, requests to know when is she able to start going back to work.  Looked for previous note from Dr. Kieth Brightlyodenbough but it has not been completed yet, questioned him about her in person and he stated that answer would best come from Dr. Riley KillSwartz..Marland Kitchen

## 2016-11-17 NOTE — Telephone Encounter (Signed)
I think it's best that we discuss that at her visit. I think she sees me either Tuesday or Wednesday

## 2016-11-21 ENCOUNTER — Encounter (HOSPITAL_BASED_OUTPATIENT_CLINIC_OR_DEPARTMENT_OTHER): Payer: PRIVATE HEALTH INSURANCE | Admitting: Physical Medicine & Rehabilitation

## 2016-11-21 ENCOUNTER — Encounter: Payer: Self-pay | Admitting: Physical Medicine & Rehabilitation

## 2016-11-21 VITALS — BP 126/82 | HR 76

## 2016-11-21 DIAGNOSIS — F310 Bipolar disorder, current episode hypomanic: Secondary | ICD-10-CM

## 2016-11-21 DIAGNOSIS — G44309 Post-traumatic headache, unspecified, not intractable: Secondary | ICD-10-CM

## 2016-11-21 DIAGNOSIS — S069X3S Unspecified intracranial injury with loss of consciousness of 1 hour to 5 hours 59 minutes, sequela: Secondary | ICD-10-CM

## 2016-11-21 DIAGNOSIS — S069X0S Unspecified intracranial injury without loss of consciousness, sequela: Secondary | ICD-10-CM

## 2016-11-21 DIAGNOSIS — S069X2S Unspecified intracranial injury with loss of consciousness of 31 minutes to 59 minutes, sequela: Secondary | ICD-10-CM | POA: Diagnosis not present

## 2016-11-21 DIAGNOSIS — S069X9S Unspecified intracranial injury with loss of consciousness of unspecified duration, sequela: Secondary | ICD-10-CM

## 2016-11-21 DIAGNOSIS — F09 Unspecified mental disorder due to known physiological condition: Secondary | ICD-10-CM | POA: Insufficient documentation

## 2016-11-21 DIAGNOSIS — Z79899 Other long term (current) drug therapy: Secondary | ICD-10-CM

## 2016-11-21 DIAGNOSIS — G3189 Other specified degenerative diseases of nervous system: Secondary | ICD-10-CM

## 2016-11-21 DIAGNOSIS — H8113 Benign paroxysmal vertigo, bilateral: Secondary | ICD-10-CM

## 2016-11-21 DIAGNOSIS — F3170 Bipolar disorder, currently in remission, most recent episode unspecified: Secondary | ICD-10-CM

## 2016-11-21 DIAGNOSIS — Z5181 Encounter for therapeutic drug level monitoring: Secondary | ICD-10-CM

## 2016-11-21 DIAGNOSIS — S069XAS Unspecified intracranial injury with loss of consciousness status unknown, sequela: Secondary | ICD-10-CM

## 2016-11-21 MED ORDER — AMPHETAMINE-DEXTROAMPHETAMINE 10 MG PO TABS: 10.0000 mg | ORAL_TABLET | Freq: Every day | ORAL | 0 refills | Status: DC

## 2016-11-21 MED ORDER — ATOMOXETINE HCL 40 MG PO CAPS: 40.0000 mg | ORAL_CAPSULE | Freq: Every day | ORAL | 4 refills | Status: DC

## 2016-11-21 MED ORDER — TRAZODONE HCL 100 MG PO TABS: 100.0000 mg | ORAL_TABLET | Freq: Every day | ORAL | 5 refills | Status: DC

## 2016-11-21 MED ORDER — ALPRAZOLAM 0.5 MG PO TABS: 0.5000 mg | ORAL_TABLET | ORAL | 2 refills | Status: DC

## 2016-11-21 NOTE — Patient Instructions (Signed)
PLEASE FEEL FREE TO CALL OUR OFFICE WITH ANY PROBLEMS OR QUESTIONS ([phone])      

## 2016-11-21 NOTE — Telephone Encounter (Signed)
Patient will be here for appointment today and will discuss with provider all current issues

## 2016-11-21 NOTE — Progress Notes (Signed)
Subjective:    Patient ID: Laurie Booker, female    DOB: Sep 04, 1965, 51 y.o.   MRN: [id number]  HPI  Laurie Booker is here in follow up of her TBI/post-concussive syndrome. She has not had much of a result with the strattera. She found that the adderall helps her more, and it hasn't caused some of the restlessness it did before. She has seen Dr. Kieth Brightly and found his insight quite helpful. She sees him again next month.   She has found with the strattera and adderall she's been able to do more around the house. She has been able to exercise. She still feels that she is not ready for work at this point given her concentration deficits.   Sleep has improved with trazodone--she takes this faitfhully at night. She also use xanax for situational anxiety  Pain Inventory Average Pain 5 Pain Right Now 4 My pain is dull and stabbing  In the last 24 hours, has pain interfered with the following? General activity 4 Relation with others 4 Enjoyment of life 4 What TIME of day is your pain at its worst? daytime Sleep (in general) Poor  Pain is worse with: bending and some activites Pain improves with: rest and medication Relief from Meds: 8  Mobility ability to climb steps?  yes do you drive?  yes  Function disabled: date disabled 8/16  Neuro/Psych tremor confusion depression anxiety loss of taste or smell  Prior Studies Any changes since last visit?  no  Physicians involved in your care Any changes since last visit?  no   No family history on file. Social History   Social History  . Marital status: Legally Separated    Spouse name: N/A  . Number of children: N/A  . Years of education: N/A   Social History Main Topics  . Smoking status: Never Smoker  . Smokeless tobacco: Never Used  . Alcohol use Yes     Comment: socially  . Drug use: Unknown  . Sexual activity: Not Asked   Other Topics Concern  . None   Social History Narrative  . None   Past Surgical History:    Procedure Laterality Date  . CESAREAN SECTION    . COLONOSCOPY W/ POLYPECTOMY    . DILATION AND CURETTAGE OF UTERUS    . MICROLARYNGOSCOPY WITH CO2 LASER AND EXCISION OF VOCAL CORD LESION N/A 03/19/2015   Procedure: MICROLARYNGOSCOPY WITH CO2 LASER AND EXCISION OF VOCAL CORD LESION;  Surgeon: Christia Reading, MD;  Location: [facility] OR;  Service: ENT;  Laterality: N/A;  Micro Direct Laryngoscopy with co2 laser excision of bilateral vocal cord granulomas/jet venturi ventilation  . RHINOPLASTY    . SEPTOPLASTY    . vocal cord surgery     granulomas removed after intubation d/t suicide attempt   Past Medical History:  Diagnosis Date  . Anxiety   . Bipolar disorder (HCC)   . Blood clots in brain    d/t traumatic brain injury Aug 2016  . Chronic kidney disease    follows up with Dr. Lequita Halt: nephrologist in [place]. Pt cannot state what is wrong with her kidneys; just that she has issues with them. (numerous renal cysts on 01/2015 CT)  . Depression    behavioral health admission 2009 or 2010  . H/O suicide attempt 2009  . H/O transfusion of packed red blood cells   . Osteoporosis   . Traumatic brain injury [facility])    with loss of consciousness, intubated, at [facility] August 2016, closed, hemorrhage  BP 126/82   Pulse 76   LMP  (LMP Unknown) Comment: trauma  SpO2 98%   Opioid Risk Score:   Fall Risk Score:  `1  Depression screen PHQ 2/9  Depression screen PHQ 2/9 03/30/2015  Decreased Interest 1  Down, Depressed, Hopeless 2  PHQ - 2 Score 3  Altered sleeping 2  Tired, decreased energy 2  Change in appetite 1  Feeling bad or failure about yourself  2  Trouble concentrating 2  Moving slowly or fidgety/restless 2  Suicidal thoughts 1  PHQ-9 Score 15  Difficult doing work/chores Very difficult    Review of Systems  Constitutional: Positive for appetite change.  HENT: Negative.   Eyes: Negative.   Respiratory: Negative.   Cardiovascular: Negative.   Gastrointestinal: Negative.    Endocrine: Negative.   Genitourinary: Negative.   Musculoskeletal: Negative.   Skin: Negative.   Allergic/Immunologic: Negative.   Neurological: Negative.   Hematological: Negative.   Psychiatric/Behavioral: Negative.   All other systems reviewed and are negative.      Objective:   Physical Exam  General: NAD.  HEENT:PERRL,  Neck:Supple  Heart:RRR Chest:CTA Abdomen:Soft, non-tender, non-distended, bowel sounds + Extremities:No edema Skin:Clean and intact  Neuro:Oriented x 3. movess all 4's. Normal sensation.  Musculoskeletal:Full ROM, No pain with AROM or PROM in the neck, trunk, or extremities. Posture appropriate.  Psych:less anxious. More focused. Occasionally have to redirect. Improving insight and awareness.,   Assessment & Plan:  1. Traumatic brain injury 01/31/15 after fall --SAH, Right frontal contusion, Right middle cranial fossa EDH  -pt with subsequent cognitive/behavioral deficits post injury  -improving awareness and cognition. Attention and anxiety are still obstacles 2. Post traumatic headaches--improved. Sometimes worsen with stress  3. Hx of bipolar disorder 4. Neurogenic bladder. resolved 5. BPPV 6. Nephrogenic diabetes insipidus    Plan:  1. Continue with  neuropsychology follow up/ testing and to also address bipolar/anxiety/coping issues. She is finding his sessions very helpful so far.  2. Lamictal, lithium, for her bipolar disorder. Continue xanax at 0.5mg  bid scheduled with a prn dose if needed.  -canconsider revisiting vpa at some point.  3. Trazodone 100mg  qhs. Refilled today 4. Continue xanax to 0.5mg  TID to help tremors, anxiety.  5. Will titrate strattera to 40mg  #30. Titrate further as necessary.   -I am ok with her using a small amount of adderall as well until we find an optimal dose of strattera  6. Gaze stabilization exercises should continue on a daily basis 7. Follow up with me in about 2 months. 30minutes  of face to face patient care time were spent during this visit. All questions were encouraged and answered.  8. Diabetes insipidus: lithium reduced to help with this             -continue per endocrine.         [place]: Raeford RazorLuis Giuffra, MD, pHD [phone]. She actively remains under his care.

## 2016-11-23 ENCOUNTER — Encounter: Payer: Self-pay | Admitting: Psychology

## 2016-11-23 NOTE — Progress Notes (Signed)
Neuropsychological Consultation   Patient:   Laurie Booker   DOB:   July 29, 1965  MR Number:  [id number]  Location:  [facility] PHYSICAL MEDICINE AND REHABILITATION [address], [place] [address] [id number] [place] [address] Dept: [phone]           Date of Service:   11/14/2016  Start Time:   8 AM End Time:   9 AM  Provider/Observer:  Arley Phenix, Psy.D.       Clinical Neuropsychologist       Billing Code/Service: [phone] 4 Units  Chief Complaint:    The patient was referred by Dr. Riley Kill for neuropsychological/psychological consultation. The patient was involved in a fall in 2016 where she struck her head on 2 separate occasions on a concrete sidewalk. The patient has had residual effects of this including difficulty articulating her thoughts, significant difficulty with memory and confusion, and reductions in overall mental processing speed. The patient reports significant anxiety, worry, and confusion. The patient reports that one of the major frustrations that she has is an inability to get her family members to understand what is happening with her and she feels like they're always telling her just to "get over it." She reports that she has a niece who is questioning the validity of her symptoms and telling her that she is just not doing enough or she would get better. She has an ongoing great deal of guilt for inability to do enough for her kids. She has not been able to work or do things that she used to do.  Reason for Service:  The patient is a 51 year old female who is referred for psychological/neuropsychological interventions. The patient was involved in a fall in 2016. She reports that she had been at a local bar with the friend in a couple of other people. She reports that she remembers having a conversation with one of the people in the bar and has very little to no memory of events after that. She reports that  she has been told that she got into an [place] with them and she into other gentleman apparently went to one of the gentleman's house. She reports that this is extremely unusual and out of character for her and that she does not think she would ever go voluntarily with people that she went with. The patient reports that she was told that when they arrived at the house that she fell backwards on the sidewalk and fractured her skull. She reports that she was told that she then tried to get up and fell forward fracturing her eye socket and jaw. The patient reports that she has no recall at all for these events. The patient questions what actually happened. No toxicology study was done. She does acknowledge that she had been consuming alcohol. The patient reports that since his accident that she has had ongoing confusion, executive functioning difficulties, difficulties with attention and concentration, memory difficulties, and a change in her sense of taste or smell. She has taste aversions to foods that she used to like. The patient does have a history of being diagnosed with bipolar 1 disorder and that she has had increasing anxiety and agitation since this accident.  Current Status:  The patient reports ongoing difficulties with attention and concentration, memory, executive functioning, speed of mental operations, and increases in anxiety and stress.  Reliability of Information: Information is derived from one hour clinical interview with the patient as well as  review of available medical records.  Behavioral Observation: Laurie Booker  presents as a 51 y.o.-year-old Right Caucasian Female who appeared her stated age. her dress was Appropriate and she was Well GGovernor Speckingroomed and her manners were Appropriate to the situation.  her participation was indicative of Appropriate behaviors.  There were any physical disabilities noted.  she displayed an appropriate level of cooperation and motivation.     Interactions:     Active Appropriate  Attention:   abnormal and attention span appeared shorter than expected for age  Memory:   abnormal; global memory impairment noted  Visuo-spatial:  within normal limits  Speech (Volume):  normal  Speech:   ; normal  Thought Process:  Coherent and Relevant  Though Content:  WNL;   Orientation:   person, place, time/date and situation  Judgment:   Good  Planning:   Good  Affect:    Anxious  Mood:    Anxious  Insight:   Good  Intelligence:   very high  Medical History:   Past Medical History:  Diagnosis Date  . Anxiety   . Bipolar disorder (HCC)   . Blood clots in brain    d/t traumatic brain injury Aug 2016  . Chronic kidney disease    follows up with Dr. Lequita HaltLufadeju: nephrologist in [place]. Pt cannot state what is wrong with her kidneys; just that she has issues with them. (numerous renal cysts on 01/2015 CT)  . Depression    behavioral health admission 2009 or 2010  . H/O suicide attempt 2009  . H/O transfusion of packed red blood cells   . Osteoporosis   . Traumatic brain injury [facility](HCC)    with loss of [facility], [facility], closed, hemorrhage       Psychiatric History:  The patient reports that she has been treated over the years for bipolar affective disorder. She reports that it is been well managed.  Family Med/Psych History: No family history on file.  Risk of Suicide/Violence: The patient denies any current suicidal or homicidal ideation.   Impression/DX:  The patient is a 51 year old female who suffered a significant fall in 2016. She fell backwards and fractured her skull and was told that she tried to stand up and then fell forward fracturing her eye socket and jaw. She has had changes in her sense of taste and smell, ongoing attention concentration difficulties, executive functioning difficulties, memory difficulties, and increasing anxiety and worry over her difficulties. The patient reports that she has been  trying to do what she can but she cannot work right now. 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 The patient has been struggling with the way others in her family have been handling her difficulties in trying to figure out what exactly is going on to cause all of her cognitive issues. She does have a prior history of bipolar affective disorder but it appears to be well managed at this time and has been for some time.  Disposition/Plan:  We'll set the patient up for individual therapeutic interventions to better cope and manage the residual effects of her traumatic brain injury.  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

## 2016-11-24 LAB — TOXASSURE SELECT,+ANTIDEPR,UR

## 2016-11-27 ENCOUNTER — Telehealth: Payer: Self-pay | Admitting: *Deleted

## 2016-11-27 NOTE — Telephone Encounter (Signed)
Urine drug screen for this encounter is consistent for prescribed medication alprazolam. There is no tramadol or trazodone in this specimen which is prn.

## 2016-12-05 ENCOUNTER — Encounter: Payer: Self-pay | Admitting: Psychology

## 2016-12-06 ENCOUNTER — Encounter: Payer: Self-pay | Attending: Physical Medicine & Rehabilitation | Admitting: Psychology

## 2016-12-06 DIAGNOSIS — S069X9S Unspecified intracranial injury with loss of consciousness of unspecified duration, sequela: Secondary | ICD-10-CM

## 2016-12-06 DIAGNOSIS — F3170 Bipolar disorder, currently in remission, most recent episode unspecified: Secondary | ICD-10-CM

## 2016-12-06 DIAGNOSIS — M810 Age-related osteoporosis without current pathological fracture: Secondary | ICD-10-CM | POA: Insufficient documentation

## 2016-12-06 DIAGNOSIS — F3161 Bipolar disorder, current episode mixed, mild: Secondary | ICD-10-CM | POA: Insufficient documentation

## 2016-12-06 DIAGNOSIS — G3189 Other specified degenerative diseases of nervous system: Secondary | ICD-10-CM

## 2016-12-06 DIAGNOSIS — S069X0S Unspecified intracranial injury without loss of consciousness, sequela: Secondary | ICD-10-CM | POA: Diagnosis not present

## 2016-12-06 DIAGNOSIS — Z5181 Encounter for therapeutic drug level monitoring: Secondary | ICD-10-CM | POA: Insufficient documentation

## 2016-12-06 DIAGNOSIS — N189 Chronic kidney disease, unspecified: Secondary | ICD-10-CM | POA: Insufficient documentation

## 2016-12-06 DIAGNOSIS — F09 Unspecified mental disorder due to known physiological condition: Secondary | ICD-10-CM | POA: Diagnosis not present

## 2016-12-06 DIAGNOSIS — G44309 Post-traumatic headache, unspecified, not intractable: Secondary | ICD-10-CM | POA: Insufficient documentation

## 2016-12-06 DIAGNOSIS — F419 Anxiety disorder, unspecified: Secondary | ICD-10-CM | POA: Insufficient documentation

## 2016-12-06 DIAGNOSIS — I669 Occlusion and stenosis of unspecified cerebral artery: Secondary | ICD-10-CM | POA: Insufficient documentation

## 2016-12-06 DIAGNOSIS — S069X2S Unspecified intracranial injury with loss of consciousness of 31 minutes to 59 minutes, sequela: Secondary | ICD-10-CM | POA: Insufficient documentation

## 2016-12-06 DIAGNOSIS — Z79899 Other long term (current) drug therapy: Secondary | ICD-10-CM | POA: Insufficient documentation

## 2016-12-28 ENCOUNTER — Encounter: Payer: Self-pay | Admitting: Psychology

## 2016-12-28 NOTE — Progress Notes (Signed)
Patient:  Laurie Booker   DOB: 03/03/1966  MR Number: [id number]  [facility] [address], [place] [address] [id number] [place] [id number] Dept: [phone]  Start: 3 PM End: 4 PM  Provider/Observer:     Hershal CoriaJohn R Rodenbough PSYD  Chief Complaint:      Chief Complaint  Patient presents with  . Memory Loss  . Depression  . Anxiety    Reason For Service:     The patient is a 51 year old female who is referred for psychological/neuropsychological interventions. The patient was involved in a fall in 2016. She reports that she had been at a local bar with the friend in a couple of other people. She reports that she remembers having a conversation with one of the people in the bar and has very little to no memory of events after that. She reports that she has been told that she got into an [place] with them and she into other gentleman apparently went to one of the gentleman's house. She reports that this is extremely unusual and out of character for her and that she does not think she would ever go voluntarily with people that she went with. The patient reports that she was told that when they arrived at the house that she fell backwards on the sidewalk and fractured her skull. She reports that she was told that she then tried to get up and fell forward fracturing her eye socket and jaw. The patient reports that she has no recall at all for these events. The patient questions what actually happened. No toxicology study was done. She does acknowledge that she had been consuming alcohol. The patient reports that since his accident that she has had ongoing confusion, executive functioning difficulties, difficulties with attention and concentration, memory difficulties, and a change in her sense of taste or smell. She has taste aversions to foods that she used to like. The patient does have a history of  being diagnosed with bipolar 1 disorder and that she has had increasing anxiety and agitation since this accident.  Interventions Strategy:  Cognitive Behavioral interventions and working on neuropsychological coping and adapting skills around residual cognitive issues following TBI  Participation Level:   Active  Participation Quality:  Appropriate and Attentive      Behavioral Observation:  Well Groomed, Alert, and Appropriate.   Current Psychosocial Factors: The patient reports that she has struggled with trying to figure out if she can try to work some type of job.  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  She has some experience with this practice and would like to give it a try to get some income coming in.  Content of Session:   Reviewed current symptoms and worked on planning and coping with residual neuropsychological deficits following TBI  Current Status:   The patient continues with memory and executive functioning issues.  Patient Progress:   The patient is working hard to develop appropriate coping responses to life stressors and changes in life functioning.    Target Goals:   Help develop skills and coping resourses.  Last Reviewed:   12/06/2016  Impression/Diagnosis:   The patient is a 51 year old female who suffered a significant fall in 2016. She fell backwards and fractured her skull and was told that she tried  to stand up and then fell forward fracturing her eye socket and jaw. She has had changes in her sense of taste and smell, ongoing attention concentration difficulties, executive functioning difficulties, memory difficulties, and increasing anxiety and worry over her difficulties. The patient reports that she has been trying to do what she can but she cannot work right now. 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  The patient has been struggling with the way others in her family have been handling her difficulties in trying to figure out what exactly is going on to cause all of her cognitive issues. She does have a prior history of bipolar affective disorder but it appears to be well managed at this time and has been for some time.  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

## 2017-01-08 ENCOUNTER — Telehealth: Payer: Self-pay | Admitting: *Deleted

## 2017-01-08 DIAGNOSIS — S069X0S Unspecified intracranial injury without loss of consciousness, sequela: Principal | ICD-10-CM

## 2017-01-08 DIAGNOSIS — F09 Unspecified mental disorder due to known physiological condition: Secondary | ICD-10-CM

## 2017-01-08 DIAGNOSIS — G3189 Other specified degenerative diseases of nervous system: Principal | ICD-10-CM

## 2017-01-08 DIAGNOSIS — S069X3S Unspecified intracranial injury with loss of consciousness of 1 hour to 5 hours 59 minutes, sequela: Secondary | ICD-10-CM

## 2017-01-08 DIAGNOSIS — S069X9S Unspecified intracranial injury with loss of consciousness of unspecified duration, sequela: Secondary | ICD-10-CM

## 2017-01-08 MED ORDER — AMPHETAMINE-DEXTROAMPHETAMINE 10 MG PO TABS: 10.0000 mg | ORAL_TABLET | Freq: Every day | ORAL | 0 refills | Status: DC

## 2017-01-08 NOTE — Telephone Encounter (Signed)
It's ok to fill the adderall 10mg 

## 2017-01-08 NOTE — Telephone Encounter (Signed)
[facility] and says that the patient asked him to call for a refill on her adderall.  By your note you were titrating Blase MessStraterra so before I print an RX for Adderall 10 mg  Eunice to sign. Please advise if this is ok.

## 2017-01-08 NOTE — Telephone Encounter (Signed)
Elle notified the Rx will be available tomorrow for pick up.

## 2017-01-16 ENCOUNTER — Encounter: Payer: Self-pay | Attending: Physical Medicine & Rehabilitation | Admitting: Psychology

## 2017-01-16 DIAGNOSIS — S069XAS Unspecified intracranial injury with loss of consciousness status unknown, sequela: Secondary | ICD-10-CM

## 2017-01-16 DIAGNOSIS — Z79899 Other long term (current) drug therapy: Secondary | ICD-10-CM | POA: Insufficient documentation

## 2017-01-16 DIAGNOSIS — F3161 Bipolar disorder, current episode mixed, mild: Secondary | ICD-10-CM | POA: Insufficient documentation

## 2017-01-16 DIAGNOSIS — S069X9S Unspecified intracranial injury with loss of consciousness of unspecified duration, sequela: Secondary | ICD-10-CM

## 2017-01-16 DIAGNOSIS — N189 Chronic kidney disease, unspecified: Secondary | ICD-10-CM | POA: Insufficient documentation

## 2017-01-16 DIAGNOSIS — F09 Unspecified mental disorder due to known physiological condition: Secondary | ICD-10-CM

## 2017-01-16 DIAGNOSIS — S069X3S Unspecified intracranial injury with loss of consciousness of 1 hour to 5 hours 59 minutes, sequela: Secondary | ICD-10-CM

## 2017-01-16 DIAGNOSIS — G3189 Other specified degenerative diseases of nervous system: Secondary | ICD-10-CM

## 2017-01-16 DIAGNOSIS — Z5181 Encounter for therapeutic drug level monitoring: Secondary | ICD-10-CM | POA: Insufficient documentation

## 2017-01-16 DIAGNOSIS — G44309 Post-traumatic headache, unspecified, not intractable: Secondary | ICD-10-CM | POA: Insufficient documentation

## 2017-01-16 DIAGNOSIS — S069X2S Unspecified intracranial injury with loss of consciousness of 31 minutes to 59 minutes, sequela: Secondary | ICD-10-CM | POA: Insufficient documentation

## 2017-01-16 DIAGNOSIS — F419 Anxiety disorder, unspecified: Secondary | ICD-10-CM | POA: Insufficient documentation

## 2017-01-16 DIAGNOSIS — M810 Age-related osteoporosis without current pathological fracture: Secondary | ICD-10-CM | POA: Insufficient documentation

## 2017-01-16 DIAGNOSIS — S069X0S Unspecified intracranial injury without loss of consciousness, sequela: Secondary | ICD-10-CM | POA: Insufficient documentation

## 2017-01-16 DIAGNOSIS — I669 Occlusion and stenosis of unspecified cerebral artery: Secondary | ICD-10-CM | POA: Insufficient documentation

## 2017-01-16 DIAGNOSIS — F3170 Bipolar disorder, currently in remission, most recent episode unspecified: Secondary | ICD-10-CM

## 2017-01-23 ENCOUNTER — Encounter: Payer: Self-pay | Admitting: Psychology

## 2017-01-23 NOTE — Progress Notes (Signed)
Patient:  Governor SpeckingMary Bartell   DOB: 05/18/1966  MR Number: [id number]  [facility] [address], [place] [address] [id number] [place] [id number] Dept: [phone]  Start: 8 AM End: 9 AM  Provider/Observer:     Hershal CoriaJohn R Shantaya Bluestone PSYD  Chief Complaint:      Chief Complaint  Patient presents with  . Memory Loss  . Depression  . Anxiety    Reason For Service:     The patient is a 51 year old female who is referred for psychological/neuropsychological interventions. The patient was involved in a fall in 2016. She reports that she had been at a local bar with the friend in a couple of other people. She reports that she remembers having a conversation with one of the people in the bar and has very little to no memory of events after that. She reports that she has been told that she got into an [place] with them and she into other gentleman apparently went to one of the gentleman's house. She reports that this is extremely unusual and out of character for her and that she does not think she would ever go voluntarily with people that she went with. The patient reports that she was told that when they arrived at the house that she fell backwards on the sidewalk and fractured her skull. She reports that she was told that she then tried to get up and fell forward fracturing her eye socket and jaw. The patient reports that she has no recall at all for these events. The patient questions what actually happened. No toxicology study was done. She does acknowledge that she had been consuming alcohol. The patient reports that since his accident that she has had ongoing confusion, executive functioning difficulties, difficulties with attention and concentration, memory difficulties, and a change in her sense of taste or smell. She has taste aversions to foods that she used to like. The patient does have a history of  being diagnosed with bipolar 1 disorder and that she has had increasing anxiety and agitation since this accident.  Interventions Strategy:  Cognitive Behavioral interventions and working on neuropsychological coping and adapting skills around residual cognitive issues following TBI  Participation Level:   Active  Participation Quality:  Appropriate and Attentive      Behavioral Observation:  Well Groomed, Alert, and Appropriate.   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 While this made her feel very good she realizes that she still did not have to maintain any type of complex decision-making. She has talked to her brother about withdrawing some of the money from her account that he is managing so she can use it is best. She is down to the point that she will run out of money and 10 months if she does not like the way to earn some money on the side. She is afraid that if she does not begin to use it in some way at this point that gone in a few months anyway.  Content of Session:   Reviewed current symptoms and worked on planning and coping with residual neuropsychological deficits following TBI  Current Status:   The patient continues with memory and executive functioning issues.  The patient has been trying very hard to try to find situations where her  cognitive difficulties would not be so disruptive to her been unable to maintain anytime gainful employment.  Patient Progress:   The patient is working hard to develop appropriate coping responses to life stressors and changes in life functioning.    Target Goals:   Help develop skills and coping resourses.  Last Reviewed:   724//2018  Impression/Diagnosis:   The patient is a 51 year old female who suffered a significant fall in 2016. She fell backwards and fractured her skull and was told that she tried  to stand up and then fell forward fracturing her eye socket and jaw. She has had changes in her sense of taste and smell, ongoing attention concentration difficulties, executive functioning difficulties, memory difficulties, and increasing anxiety and worry over her difficulties. The patient reports that she has been trying to do what she can but she cannot work right now. 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 The patient has been struggling with the way others in her family have been handling her difficulties in trying to figure out what exactly is going on to cause all of her cognitive issues. She does have a prior history of bipolar affective disorder but it appears to be well managed at this time and has been for some time.  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

## 2017-01-24 ENCOUNTER — Encounter: Payer: Self-pay | Admitting: Physical Medicine & Rehabilitation

## 2017-02-15 ENCOUNTER — Encounter: Payer: Self-pay | Attending: Physical Medicine & Rehabilitation | Admitting: Psychology

## 2017-02-15 DIAGNOSIS — S069X3S Unspecified intracranial injury with loss of consciousness of 1 hour to 5 hours 59 minutes, sequela: Secondary | ICD-10-CM

## 2017-02-15 DIAGNOSIS — G3189 Other specified degenerative diseases of nervous system: Secondary | ICD-10-CM

## 2017-02-15 DIAGNOSIS — S069XAS Unspecified intracranial injury with loss of consciousness status unknown, sequela: Secondary | ICD-10-CM

## 2017-02-15 DIAGNOSIS — Z5181 Encounter for therapeutic drug level monitoring: Secondary | ICD-10-CM | POA: Insufficient documentation

## 2017-02-15 DIAGNOSIS — F419 Anxiety disorder, unspecified: Secondary | ICD-10-CM | POA: Insufficient documentation

## 2017-02-15 DIAGNOSIS — S069X9S Unspecified intracranial injury with loss of consciousness of unspecified duration, sequela: Secondary | ICD-10-CM

## 2017-02-15 DIAGNOSIS — Z79899 Other long term (current) drug therapy: Secondary | ICD-10-CM | POA: Insufficient documentation

## 2017-02-15 DIAGNOSIS — S069X2S Unspecified intracranial injury with loss of consciousness of 31 minutes to 59 minutes, sequela: Secondary | ICD-10-CM | POA: Insufficient documentation

## 2017-02-15 DIAGNOSIS — M810 Age-related osteoporosis without current pathological fracture: Secondary | ICD-10-CM | POA: Insufficient documentation

## 2017-02-15 DIAGNOSIS — F09 Unspecified mental disorder due to known physiological condition: Secondary | ICD-10-CM

## 2017-02-15 DIAGNOSIS — S069X0S Unspecified intracranial injury without loss of consciousness, sequela: Secondary | ICD-10-CM | POA: Insufficient documentation

## 2017-02-15 DIAGNOSIS — F3170 Bipolar disorder, currently in remission, most recent episode unspecified: Secondary | ICD-10-CM

## 2017-02-15 DIAGNOSIS — F3161 Bipolar disorder, current episode mixed, mild: Secondary | ICD-10-CM | POA: Insufficient documentation

## 2017-02-15 DIAGNOSIS — I669 Occlusion and stenosis of unspecified cerebral artery: Secondary | ICD-10-CM | POA: Insufficient documentation

## 2017-02-15 DIAGNOSIS — G44309 Post-traumatic headache, unspecified, not intractable: Secondary | ICD-10-CM | POA: Insufficient documentation

## 2017-02-15 DIAGNOSIS — N189 Chronic kidney disease, unspecified: Secondary | ICD-10-CM | POA: Insufficient documentation

## 2017-02-19 ENCOUNTER — Encounter: Payer: Self-pay | Admitting: Physical Medicine & Rehabilitation

## 2017-03-18 NOTE — Progress Notes (Signed)
Patient:  Laurie Booker   DOB: [birth year]  MR Number: [id number]  Location: [facility] FOR PAIN AND REHABILITATIVE MEDICINE [facility] PHYSICAL MEDICINE AND REHABILITATION [address] [place] [address] [id number] [place] [address] Dept: [phone]  Start: 8 AM End: 9 AM  Provider/Observer:     Hershal Coria PSYD  Chief Complaint:      Chief Complaint  Patient presents with  . Agitation  . Anxiety  . Depression  . Memory Loss    Reason For Service:     The patient is a 51 year old female who is referred for psychological/neuropsychological interventions. The patient was involved in a fall in 2016. She reports that she had been at a local bar with the friend in a couple of other people. She reports that she remembers having a conversation with one of the people in the bar and has very little to no memory of events after that. She reports that she has been told that she got into an [place] with them and she into other gentleman apparently went to one of the gentleman's house. She reports that this is extremely unusual and out of character for her and that she does not think she would ever go voluntarily with people that she went with. The patient reports that she was told that when they arrived at the house that she fell backwards on the sidewalk and fractured her skull. She reports that she was told that she then tried to get up and fell forward fracturing her eye socket and jaw. The patient reports that she has no recall at all for these events. The patient questions what actually happened. No toxicology study was done. She does acknowledge that she had been consuming alcohol. The patient reports that since his accident that she has had ongoing confusion, executive functioning difficulties, difficulties with attention and concentration, memory difficulties, and a change in her sense of taste or smell. She has taste aversions to foods that she used to like. The patient does  have a history of being diagnosed with bipolar 1 disorder and that she has had increasing anxiety and agitation since this accident.  Interventions Strategy:  Cognitive Behavioral interventions and working on neuropsychological coping and adapting skills around residual cognitive issues following TBI  Participation Level:   Active  Participation Quality:  Appropriate and Attentive      Behavioral Observation:  Well Groomed, Alert, and Appropriate.   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 While this made her feel very good she realizes that she still did not have to maintain any type of complex decision-making. She has talked to her brother about withdrawing some of the money from her account that he is managing so she can use it is best. She is down to the point that she will run out of money and 10 months if she does not like the way to earn some money on the side. She is afraid that if she does not begin to use it in some way at this point that gone in a few months anyway.  Content of Session:   Reviewed current symptoms and worked on planning and coping with residual neuropsychological deficits following TBI  Current Status:   The patient continues with memory and executive functioning issues.  The patient has been trying very hard to try to find  situations where her cognitive difficulties would not be so disruptive to her been unable to maintain anytime gainful employment.  Patient Progress:   The patient is working hard to develop appropriate coping responses to life stressors and changes in life functioning.    Target Goals:   Help develop skills and coping resourses.  Last Reviewed:   02/15/2017  Impression/Diagnosis:   The patient is a 51 year old female who suffered a significant fall in 2016. She fell backwards and fractured her skull and was  told that she tried to stand up and then fell forward fracturing her eye socket and jaw. She has had changes in her sense of taste and smell, ongoing attention concentration difficulties, executive functioning difficulties, memory difficulties, and increasing anxiety and worry over her difficulties. The patient reports that she has been trying to do what she can but she cannot work right now. 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 The patient has been struggling with the way others in her family have been handling her difficulties in trying to figure out what exactly is going on to cause all of her cognitive issues. She does have a prior history of bipolar affective disorder but it appears to be well managed at this time and has been for some time.  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

## 2017-03-27 ENCOUNTER — Encounter: Payer: Self-pay | Admitting: Psychology

## 2017-03-27 ENCOUNTER — Encounter: Payer: Self-pay | Attending: Physical Medicine & Rehabilitation | Admitting: Psychology

## 2017-03-27 DIAGNOSIS — F09 Unspecified mental disorder due to known physiological condition: Secondary | ICD-10-CM

## 2017-03-27 DIAGNOSIS — S069X0S Unspecified intracranial injury without loss of consciousness, sequela: Secondary | ICD-10-CM | POA: Insufficient documentation

## 2017-03-27 DIAGNOSIS — N189 Chronic kidney disease, unspecified: Secondary | ICD-10-CM | POA: Insufficient documentation

## 2017-03-27 DIAGNOSIS — G44309 Post-traumatic headache, unspecified, not intractable: Secondary | ICD-10-CM | POA: Insufficient documentation

## 2017-03-27 DIAGNOSIS — S069X3S Unspecified intracranial injury with loss of consciousness of 1 hour to 5 hours 59 minutes, sequela: Secondary | ICD-10-CM

## 2017-03-27 DIAGNOSIS — S069X9S Unspecified intracranial injury with loss of consciousness of unspecified duration, sequela: Secondary | ICD-10-CM

## 2017-03-27 DIAGNOSIS — Z5181 Encounter for therapeutic drug level monitoring: Secondary | ICD-10-CM | POA: Insufficient documentation

## 2017-03-27 DIAGNOSIS — Z79899 Other long term (current) drug therapy: Secondary | ICD-10-CM | POA: Insufficient documentation

## 2017-03-27 DIAGNOSIS — M810 Age-related osteoporosis without current pathological fracture: Secondary | ICD-10-CM | POA: Insufficient documentation

## 2017-03-27 DIAGNOSIS — F419 Anxiety disorder, unspecified: Secondary | ICD-10-CM | POA: Insufficient documentation

## 2017-03-27 DIAGNOSIS — G3189 Other specified degenerative diseases of nervous system: Secondary | ICD-10-CM

## 2017-03-27 DIAGNOSIS — F3161 Bipolar disorder, current episode mixed, mild: Secondary | ICD-10-CM | POA: Insufficient documentation

## 2017-03-27 DIAGNOSIS — I669 Occlusion and stenosis of unspecified cerebral artery: Secondary | ICD-10-CM | POA: Insufficient documentation

## 2017-03-27 DIAGNOSIS — S069X2S Unspecified intracranial injury with loss of consciousness of 31 minutes to 59 minutes, sequela: Secondary | ICD-10-CM | POA: Insufficient documentation

## 2017-03-27 DIAGNOSIS — F3170 Bipolar disorder, currently in remission, most recent episode unspecified: Secondary | ICD-10-CM

## 2017-03-27 NOTE — Progress Notes (Signed)
Patient:  Laurie Booker   DOB: [birth year]  MR Number: [id number]  Location: [facility] FOR PAIN AND REHABILITATIVE MEDICINE [facility] PHYSICAL MEDICINE AND REHABILITATION [address] [place] [address] [id number] [place] [address] Dept: [phone]  Start: 8 AM End: 9 AM  Provider/Observer:     Hershal Coria PSYD  Chief Complaint:      Chief Complaint  Patient presents with  . Memory Loss  . Agitation    Reason For Service:     The patient is a 51 year old female who is referred for psychological/neuropsychological interventions. The patient was involved in a fall in 2016. She reports that she had been at a local bar with the friend in a couple of other people. She reports that she remembers having a conversation with one of the people in the bar and has very little to no memory of events after that. She reports that she has been told that she got into an [place] with them and she into other gentleman apparently went to one of the gentleman's house. She reports that this is extremely unusual and out of character for her and that she does not think she would ever go voluntarily with people that she went with. The patient reports that she was told that when they arrived at the house that she fell backwards on the sidewalk and fractured her skull. She reports that she was told that she then tried to get up and fell forward fracturing her eye socket and jaw. The patient reports that she has no recall at all for these events. The patient questions what actually happened. No toxicology study was done. She does acknowledge that she had been consuming alcohol. The patient reports that since his accident that she has had ongoing confusion, executive functioning difficulties, difficulties with attention and concentration, memory difficulties, and a change in her sense of taste or smell. She has taste aversions to foods that she used to like. The patient does have a history of being  diagnosed with bipolar 1 disorder and that she has had increasing anxiety and agitation since this accident.  Interventions Strategy:  Cognitive Behavioral interventions and working on neuropsychological coping and adapting skills around residual cognitive issues following TBI  Participation Level:   Active  Participation Quality:  Appropriate and Attentive      Behavioral Observation:  Well Groomed, Alert, and Appropriate.   Current Psychosocial Factors: The patient reports that she has continued to have issues related to family interactions and around her brother.  She has been working on that well.  The patient has now gotten more cars and been able to resale them for profit.  She has felt good about that.  Content of Session:   Reviewed current symptoms and worked on planning and coping with residual neuropsychological deficits following TBI  Current Status:   The patient continues with memory and executive functioning issues.  The patient has been trying very hard to try to find situations where her cognitive difficulties would not be so disruptive to her been unable to maintain anytime gainful employment.  Patient Progress:   The patient is working hard to develop appropriate coping responses to life stressors and changes in life functioning.    Target Goals:   Help develop skills and coping resourses.  Last Reviewed:   03/27/2017  Impression/Diagnosis:   The patient is a 51 year old female who suffered a significant fall in 2016. She fell backwards and fractured her skull and was told that  she tried to stand up and then fell forward fracturing her eye socket and jaw. She has had changes in her sense of taste and smell, ongoing attention concentration difficulties, executive functioning difficulties, memory difficulties, and increasing anxiety and worry over her difficulties. The patient reports that she has been trying to do what she can but she cannot work right now. 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 The patient has been struggling with the way others in her family have been handling her difficulties in trying to figure out what exactly is going on to cause all of her cognitive issues. She does have a prior history of bipolar affective disorder but it appears to be well managed at this time and has been for some time.  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

## 2017-04-10 ENCOUNTER — Encounter (HOSPITAL_BASED_OUTPATIENT_CLINIC_OR_DEPARTMENT_OTHER): Payer: Self-pay | Admitting: Physical Medicine & Rehabilitation

## 2017-04-10 ENCOUNTER — Encounter: Payer: Self-pay | Admitting: Physical Medicine & Rehabilitation

## 2017-04-10 VITALS — BP 151/80 | HR 75

## 2017-04-10 DIAGNOSIS — Z5181 Encounter for therapeutic drug level monitoring: Secondary | ICD-10-CM

## 2017-04-10 DIAGNOSIS — G3189 Other specified degenerative diseases of nervous system: Secondary | ICD-10-CM

## 2017-04-10 DIAGNOSIS — F310 Bipolar disorder, current episode hypomanic: Secondary | ICD-10-CM

## 2017-04-10 DIAGNOSIS — Z79899 Other long term (current) drug therapy: Secondary | ICD-10-CM

## 2017-04-10 DIAGNOSIS — S069X0S Unspecified intracranial injury without loss of consciousness, sequela: Secondary | ICD-10-CM

## 2017-04-10 DIAGNOSIS — S069X9S Unspecified intracranial injury with loss of consciousness of unspecified duration, sequela: Secondary | ICD-10-CM

## 2017-04-10 DIAGNOSIS — S069XAS Unspecified intracranial injury with loss of consciousness status unknown, sequela: Secondary | ICD-10-CM

## 2017-04-10 DIAGNOSIS — S069X3S Unspecified intracranial injury with loss of consciousness of 1 hour to 5 hours 59 minutes, sequela: Secondary | ICD-10-CM

## 2017-04-10 DIAGNOSIS — F09 Unspecified mental disorder due to known physiological condition: Secondary | ICD-10-CM

## 2017-04-10 DIAGNOSIS — G44309 Post-traumatic headache, unspecified, not intractable: Secondary | ICD-10-CM

## 2017-04-10 MED ORDER — AMPHETAMINE-DEXTROAMPHETAMINE 5 MG PO TABS: 5.0000 mg | ORAL_TABLET | Freq: Two times a day (BID) | ORAL | 0 refills | Status: DC

## 2017-04-10 MED ORDER — ALPRAZOLAM 0.5 MG PO TABS: 0.5000 mg | ORAL_TABLET | ORAL | 2 refills | Status: DC

## 2017-04-10 NOTE — Patient Instructions (Signed)
PLEASE FEEL FREE TO CALL OUR OFFICE WITH ANY PROBLEMS OR QUESTIONS ([phone])      

## 2017-04-10 NOTE — Progress Notes (Signed)
Subjective:    Patient ID: Laurie Booker, female    DOB: 1965-07-01, 51 y.o.   MRN: [id number]  HPI   Pat is here in follow up of her TBI and associated and cognitive/behavioral deficits. She has been doing fairly well of late. She has found that she "ruminates" on certain topics/stressors and has a hard time shifting her mind to a different topic. She struggles with family members who don't "understand" what she's going through and point fingers at her as to why she just doesn't snap out of it.   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 She has made several thousand dollars off the process so far. She has found this engaging and gratifying from an emotional standpoint in addition to the financial profit she has made.   She is no longer using straterra, but is using  adderall in the am and sometimes in the PM. The lower dose seems to help her quite a bit with concentration without the hyperactivity  She admitted to Dr. Kieth Brightly that she went out and had two beers (which apparently were high gravity) that really caused some disorientation   Pain Inventory Average Pain 2 Pain Right Now 0 My pain is intermittent and dull  In the last 24 hours, has pain interfered with the following? General activity 2 Relation with others 2 Enjoyment of life 2 What TIME of day is your pain at its worst? daytime Sleep (in general) Fair  Pain is worse with: some activites Pain improves with: . Relief from Meds: 8  Mobility ability to climb steps?  yes  Function not employed: date last employed 8/16  Neuro/Psych tremor dizziness confusion depression anxiety  Prior Studies Any changes since last visit?  no  Physicians involved in your care Any changes since last visit?  no   No family history on file. Social History   Social History  . Marital status: Legally Separated    Spouse name: N/A  . Number of children: N/A  . Years of  education: N/A   Social History Main Topics  . Smoking status: Never Smoker  . Smokeless tobacco: Never Used  . Alcohol use Yes     Comment: socially  . Drug use: Unknown  . Sexual activity: Not on file   Other Topics Concern  . Not on file   Social History Narrative  . No narrative on file   Past Surgical History:  Procedure Laterality Date  . CESAREAN SECTION    . COLONOSCOPY W/ POLYPECTOMY    . DILATION AND CURETTAGE OF UTERUS    . MICROLARYNGOSCOPY WITH CO2 LASER AND EXCISION OF VOCAL CORD LESION N/A 03/19/2015   Procedure: MICROLARYNGOSCOPY WITH CO2 LASER AND EXCISION OF VOCAL CORD LESION;  Surgeon: Christia Reading, MD;  Location: [facility] OR;  Service: ENT;  Laterality: N/A;  Micro Direct Laryngoscopy with co2 laser excision of bilateral vocal cord granulomas/jet venturi ventilation  . RHINOPLASTY    . SEPTOPLASTY    . vocal cord surgery     granulomas removed after intubation d/t suicide attempt   Past Medical History:  Diagnosis Date  . Anxiety   . Bipolar disorder (HCC)   . Blood clots in brain    d/t traumatic brain injury Aug 2016  . Chronic kidney disease    follows up with Dr. Lequita Halt: nephrologist in [place]. Pt cannot state what is wrong with her kidneys; just that she has issues with them. (numerous renal cysts  on 01/2015 CT)  . Depression    behavioral health admission 2009 or 2010  . H/O suicide attempt 2009  . H/O transfusion of packed red blood cells   . Osteoporosis   . Traumatic brain injury [facility])    with loss of consciousness, intubated, at [facility] August 2016, closed, hemorrhage   LMP  (LMP Unknown) Comment: trauma  Opioid Risk Score:   Fall Risk Score:  `1  Depression screen PHQ 2/9  Depression screen [facility] 2/9 04/10/2017 03/30/2015  Decreased Interest 1 1  Down, Depressed, Hopeless 1 2  PHQ - 2 Score 2 3  Altered sleeping - 2  Tired, decreased energy - 2  Change in appetite - 1  Feeling bad or failure about yourself  - 2  Trouble concentrating - 2    Moving slowly or fidgety/restless - 2  Suicidal thoughts - 1  PHQ-9 Score - 15  Difficult doing work/chores - Very difficult      Review of Systems  Constitutional: Negative for activity change.  HENT: Negative for ear discharge.   Eyes: Negative for redness.  Cardiovascular: Negative for palpitations.  Endocrine: Negative for heat intolerance.  Genitourinary: Negative for frequency.  Neurological: Negative for light-headedness.  Psychiatric/Behavioral: Positive for agitation, behavioral problems and confusion.       Objective:   Physical Exam  General: NAD.  HEENT:PERRL,  Neck:Supple  Heart: RRR Chest: normal efort Abdomen:Soft, non-tender, non-distended, bowel sounds + Extremities:No edema Skin:Clean and intact  Neuro:Oriented x 3. movess all 4's. Normal sensation.  Musculoskeletal:Normal ROM.  Strength 5/5.  Psych:still some what distracted but more focused. Functional insight, memory, awareness .   Assessment & Plan:  1. Traumatic brain injury 01/31/15 after fall --SAH, Right frontal contusion, Right middle cranial fossa EDH  -pt with subsequent cognitive/behavioral deficits post injury  -improving awareness and cognition. Attention and anxiety are still obstacles 2. Post traumatic headaches--improved. Sometimes worsen with stress  3. Hx of bipolar disorder 4. Neurogenic bladder. resolved 5. BPPV 6. Nephrogenic diabetes insipidus    Plan:  1. Continue with  neuropsychology follow up/ testing and to also address bipolar/anxiety/coping issues. She is finding his sessions very helpful. Want her to continue focusing on coping skills to deal with anxiety, family relations,  and perseverative behaviors.  2. Lamictal, lithium, for her bipolar disorder. Continue xanax at 0.5mg  bid scheduled with a prn dose if needed.  -canconsider vpa again at some point.  3. Trazodone  qhs. Sleeping well now 4. Continue xanax to 0.5mg  TID to help tremors,  anxiety. consider klonopin for perseverative behavior.  5. Continue with low dose adderall  qd to bid, a second rx was provided. Drug testing was performed today. We discussed the potential of alcohol with her medications and hx of TBI. She shall not resume straterra  6. Gaze stabilization exercises shall continue 7. Follow up with me in about 3 months. of face to face patient care time were spent during this visit. All questions were encouraged and answered.  8. Diabetes insipidus: lithium reduced to help with this -endocrine       Her psychiatrist in [place]: Raeford Razor, MD, pHD [phone]. She actively remains under his care.

## 2017-04-13 ENCOUNTER — Telehealth: Payer: Self-pay

## 2017-04-13 MED ORDER — AMPHETAMINE-DEXTROAMPHETAMINE 10 MG PO TABS: 10.0000 mg | ORAL_TABLET | Freq: Two times a day (BID) | ORAL | 0 refills | Status: DC

## 2017-04-13 NOTE — Telephone Encounter (Signed)
Rx printed for 10 mg Adderall generic #60  and placed in Dr Riley KillSwartz inbox for signature. Ms Sabino GasserMullins notifed. She wanted to have the 2 x day quantity previously ordered.

## 2017-04-13 NOTE — Telephone Encounter (Signed)
That's fine. I assume she means the 10mg  adderall?? We can fix on Monday

## 2017-04-13 NOTE — Telephone Encounter (Signed)
Patient called stating that she wants to be put back to 1mg  of adderall instead of 0.5mg  because the pharmacy charges triple the price.

## 2017-04-14 LAB — DRUG TOX MONITOR 1 W/CONF, ORAL FLD
Alprazolam: 3.01 ng/mL — ABNORMAL HIGH (ref <0.50)
Amphetamine: 26 ng/mL — ABNORMAL HIGH (ref <10)
Amphetamines: POSITIVE ng/mL — AB (ref <10)
Barbiturates: NEGATIVE ng/mL (ref <10)
Benzodiazepines: POSITIVE ng/mL — AB (ref <0.50)
Buprenorphine: NEGATIVE ng/mL (ref <0.025)
Chlordiazepoxide: NEGATIVE ng/mL (ref <0.50)
Clonazepam: NEGATIVE ng/mL (ref <0.50)
Cocaine: NEGATIVE ng/mL (ref <2.5)
Diazepam: NEGATIVE ng/mL (ref <0.50)
Fentanyl: NEGATIVE ng/mL (ref <0.10)
Flunitrazepam: NEGATIVE ng/mL (ref <0.50)
Flurazepam: NEGATIVE ng/mL (ref <0.50)
Heroin Metabolite: NEGATIVE ng/mL (ref <1.0)
Lorazepam: NEGATIVE ng/mL (ref <0.50)
MARIJUANA: NEGATIVE ng/mL (ref <2.5)
MDMA: NEGATIVE ng/mL (ref <10)
Meperidine: NEGATIVE ng/mL (ref <5.0)
Meprobamate: NEGATIVE ng/mL (ref <2.5)
Methadone: NEGATIVE ng/mL (ref <5.0)
Methamphetamine: NEGATIVE ng/mL (ref <10)
Midazolam: NEGATIVE ng/mL (ref <0.50)
Nicotine Metabolite: NEGATIVE ng/mL (ref <5.0)
Nordiazepam: NEGATIVE ng/mL (ref <0.50)
Opiates: NEGATIVE ng/mL (ref <2.5)
Oxazepam: NEGATIVE ng/mL (ref <0.50)
Phencyclidine: NEGATIVE ng/mL (ref <10)
Propoxyphene: NEGATIVE ng/mL (ref <5.0)
Tapentadol: NEGATIVE ng/mL (ref <5.0)
Temazepam: NEGATIVE ng/mL (ref <0.50)
Tramadol: NEGATIVE ng/mL (ref <5.0)
Triazolam: NEGATIVE ng/mL (ref <0.50)
Zolpidem: NEGATIVE ng/mL (ref <5.0)

## 2017-04-14 LAB — DRUG TOX METHYLPHEN W/CONF,ORAL FLD: Methylphenidate: NEGATIVE ng/mL (ref <1.0)

## 2017-04-14 LAB — DRUG TOX ALC METAB W/CON, ORAL FLD: Alcohol Metabolite: NEGATIVE ng/mL (ref <25)

## 2017-04-16 ENCOUNTER — Telehealth: Payer: Self-pay | Admitting: *Deleted

## 2017-04-16 NOTE — Telephone Encounter (Signed)
Oral swab drug screen was consistent for prescribed medications.  ?

## 2017-04-23 ENCOUNTER — Other Ambulatory Visit: Payer: Self-pay

## 2017-04-23 NOTE — Telephone Encounter (Signed)
Patient called stating that she needs her adderall 10mg  to be E-scribed to [facility]. The # is [phone].

## 2017-04-24 NOTE — Telephone Encounter (Signed)
Notified her that we are not able to e scribe controlled substances currently and she will need to pick up the Rx.  It is up at front desk.

## 2017-05-08 ENCOUNTER — Telehealth: Payer: Self-pay | Admitting: *Deleted

## 2017-05-08 NOTE — Telephone Encounter (Signed)
This is FYI information.  Taffy stopped by the office this afternoon and wanted Dr Sima Matas and Dr Naaman Plummer to be made aware that she has met with her disability ss person for approx 1 1/2 hour and things are in place to move forward on her disability.

## 2017-05-21 ENCOUNTER — Telehealth: Payer: Self-pay

## 2017-05-21 NOTE — Telephone Encounter (Signed)
Patient called stating that social security is requesting records for disability and an upcoming conversation with Dr. Riley KillSwartz and Dr. Kieth Brightlyodenbough. She said that social security will be sending papers to be filled out.

## 2017-05-29 ENCOUNTER — Encounter: Payer: Self-pay | Attending: Physical Medicine & Rehabilitation | Admitting: Psychology

## 2017-05-29 DIAGNOSIS — S069X2S Unspecified intracranial injury with loss of consciousness of 31 minutes to 59 minutes, sequela: Secondary | ICD-10-CM | POA: Insufficient documentation

## 2017-05-29 DIAGNOSIS — F09 Unspecified mental disorder due to known physiological condition: Secondary | ICD-10-CM

## 2017-05-29 DIAGNOSIS — S069X9S Unspecified intracranial injury with loss of consciousness of unspecified duration, sequela: Secondary | ICD-10-CM

## 2017-05-29 DIAGNOSIS — F3170 Bipolar disorder, currently in remission, most recent episode unspecified: Secondary | ICD-10-CM

## 2017-05-29 DIAGNOSIS — G3189 Other specified degenerative diseases of nervous system: Secondary | ICD-10-CM

## 2017-05-29 DIAGNOSIS — N189 Chronic kidney disease, unspecified: Secondary | ICD-10-CM | POA: Insufficient documentation

## 2017-05-29 DIAGNOSIS — Z5181 Encounter for therapeutic drug level monitoring: Secondary | ICD-10-CM | POA: Insufficient documentation

## 2017-05-29 DIAGNOSIS — F419 Anxiety disorder, unspecified: Secondary | ICD-10-CM | POA: Insufficient documentation

## 2017-05-29 DIAGNOSIS — F3161 Bipolar disorder, current episode mixed, mild: Secondary | ICD-10-CM | POA: Insufficient documentation

## 2017-05-29 DIAGNOSIS — G44309 Post-traumatic headache, unspecified, not intractable: Secondary | ICD-10-CM | POA: Insufficient documentation

## 2017-05-29 DIAGNOSIS — M810 Age-related osteoporosis without current pathological fracture: Secondary | ICD-10-CM | POA: Insufficient documentation

## 2017-05-29 DIAGNOSIS — S069X0S Unspecified intracranial injury without loss of consciousness, sequela: Secondary | ICD-10-CM | POA: Insufficient documentation

## 2017-05-29 DIAGNOSIS — I669 Occlusion and stenosis of unspecified cerebral artery: Secondary | ICD-10-CM | POA: Insufficient documentation

## 2017-05-29 DIAGNOSIS — Z79899 Other long term (current) drug therapy: Secondary | ICD-10-CM | POA: Insufficient documentation

## 2017-05-31 ENCOUNTER — Encounter: Payer: Self-pay | Admitting: Psychology

## 2017-05-31 NOTE — Progress Notes (Signed)
Patient:  Governor SpeckingMary Alberts   DOB: 11/06/1965  MR Number: [id number]  [facility] [address], [place] [address] [id number] [place] [id number] Dept: [phone]  Start: 10 AM End: 11 AM  Provider/Observer:     Hershal CoriaJohn R Eagle Pitta PSYD  Chief Complaint:      Chief Complaint  Patient presents with  . Memory Loss  . Depression  . Stress    Reason For Service:     The patient is a 51 year old female who is referred for psychological/neuropsychological interventions. The patient was involved in a fall in 2016. She reports that she had been at a local bar with the friend in a couple of other people. She reports that she remembers having a conversation with one of the people in the bar and has very little to no memory of events after that. She reports that she has been told that she [place] into [place] them and she into other gentleman apparently went to one of the gentleman's house. She reports that this is extremely unusual and out of character for her and that she does not think she would ever go voluntarily with people that she went with. The patient reports that she was told that when they arrived at the house that she fell backwards on the sidewalk and fractured her skull. She reports that she was told that she then tried to get up and fell forward fracturing her eye socket and jaw. The patient reports that she has no recall at all for these events. The patient questions what actually happened. No toxicology study was done. She does acknowledge that she had been consuming alcohol. The patient reports that since his accident that she has had ongoing confusion, executive functioning difficulties, difficulties with attention and concentration, memory difficulties, and a change in her sense of taste or smell. She has taste aversions to foods that she used to like. The patient does have a history  of being diagnosed with bipolar 1 disorder and that she has had increasing anxiety and agitation since this accident.  Interventions Strategy:  Cognitive Behavioral interventions and working on neuropsychological coping and adapting skills around residual cognitive issues following TBI  Participation Level:   Active  Participation Quality:  Appropriate and Attentive      Behavioral Observation:  Well Groomed, Alert, and Appropriate.   Current Psychosocial Factors: The patient reports that there have been ongoing difficulties between her and her brother who is managing her financial affairs.  She reports it is been very difficult for her to get any money from him when she really needs it.  While it is not a significant amount of money she needs it for day-to-day functioning.  The patient is not able to work.  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  However, she is having difficulty managing this and is making very little money with the cars but knows that she is not able to do other things such as maintain full-time gainful employment..  Content of Session:   Reviewed current symptoms and worked on planning and coping with residual neuropsychological deficits following TBI  Current Status:   The patient continues with memory and executive functioning issues.  The patient has been trying very hard to try to  find situations where her cognitive difficulties would not be so disruptive to her been unable to maintain anytime gainful employment.  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  Stressors related to psychosocial/family functioning continue to be quite problematic.  Patient  Progress:   The patient is working hard to develop appropriate coping responses to life stressors and changes in life functioning.    Target Goals:   Help develop skills and coping resourses.  Last Reviewed:   05/29/2017  Impression/Diagnosis:   The patient is a 51 year old female who suffered a significant fall in 2016. She fell backwards and fractured her skull and was told that she tried to stand up and then fell forward fracturing her eye socket and jaw. She has had changes in her sense of taste and smell, ongoing attention concentration difficulties, executive functioning difficulties, memory difficulties, and increasing anxiety and worry over her difficulties. The patient reports that she has been trying to do what she can but she cannot work right now. 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 The patient has been struggling with the way others in her family have been handling her difficulties in trying to figure out what exactly is going on to cause all of her cognitive issues. She does have a prior history of bipolar affective disorder but it appears to be well managed at this time and has been for some time.  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

## 2017-07-11 ENCOUNTER — Encounter: Payer: Self-pay | Admitting: Physical Medicine & Rehabilitation

## 2017-07-19 ENCOUNTER — Encounter: Payer: Self-pay | Admitting: Psychology

## 2017-07-19 ENCOUNTER — Encounter: Payer: Self-pay | Attending: Physical Medicine & Rehabilitation | Admitting: Psychology

## 2017-07-19 DIAGNOSIS — S069X9S Unspecified intracranial injury with loss of consciousness of unspecified duration, sequela: Secondary | ICD-10-CM

## 2017-07-19 DIAGNOSIS — S069X2S Unspecified intracranial injury with loss of consciousness of 31 minutes to 59 minutes, sequela: Secondary | ICD-10-CM | POA: Insufficient documentation

## 2017-07-19 DIAGNOSIS — Z5181 Encounter for therapeutic drug level monitoring: Secondary | ICD-10-CM | POA: Insufficient documentation

## 2017-07-19 DIAGNOSIS — F419 Anxiety disorder, unspecified: Secondary | ICD-10-CM | POA: Insufficient documentation

## 2017-07-19 DIAGNOSIS — Z79899 Other long term (current) drug therapy: Secondary | ICD-10-CM | POA: Insufficient documentation

## 2017-07-19 DIAGNOSIS — S069X0S Unspecified intracranial injury without loss of consciousness, sequela: Secondary | ICD-10-CM | POA: Insufficient documentation

## 2017-07-19 DIAGNOSIS — G3189 Other specified degenerative diseases of nervous system: Secondary | ICD-10-CM

## 2017-07-19 DIAGNOSIS — G44309 Post-traumatic headache, unspecified, not intractable: Secondary | ICD-10-CM | POA: Insufficient documentation

## 2017-07-19 DIAGNOSIS — I669 Occlusion and stenosis of unspecified cerebral artery: Secondary | ICD-10-CM | POA: Insufficient documentation

## 2017-07-19 DIAGNOSIS — F09 Unspecified mental disorder due to known physiological condition: Secondary | ICD-10-CM

## 2017-07-19 DIAGNOSIS — N189 Chronic kidney disease, unspecified: Secondary | ICD-10-CM | POA: Insufficient documentation

## 2017-07-19 DIAGNOSIS — F3170 Bipolar disorder, currently in remission, most recent episode unspecified: Secondary | ICD-10-CM

## 2017-07-19 DIAGNOSIS — M810 Age-related osteoporosis without current pathological fracture: Secondary | ICD-10-CM | POA: Insufficient documentation

## 2017-07-19 DIAGNOSIS — F3161 Bipolar disorder, current episode mixed, mild: Secondary | ICD-10-CM | POA: Insufficient documentation

## 2017-07-19 NOTE — Progress Notes (Signed)
Patient:  Laurie Booker   DOB: 1966/05/04  MR Number: [id number]  Location: [facility] FOR PAIN AND REHABILITATIVE MEDICINE [facility] PHYSICAL MEDICINE AND REHABILITATION [address] [place] [id number] [place] [address] Dept: [phone]  Start: 10 AM End: 11 AM  Provider/Observer:     Hershal Coria PSYD  Chief Complaint:      Chief Complaint  Patient presents with  . Memory Loss  . Anxiety  . Agitation  . Stress    Reason For Service:     The patient is a 52 year old female who is referred for psychological/neuropsychological interventions. The patient was involved in a fall in 2016. She reports that she had been at a local bar with the friend in a couple of other people. She reports that she remembers having a conversation with one of the people in the bar and has very little to no memory of events after that. She reports that she has been told that she got into an [place] with them and she into other gentleman apparently went to one of the gentleman's house. She reports that this is extremely unusual and out of character for her and that she does not think she would ever go voluntarily with people that she went with. The patient reports that she was told that when they arrived at the house that she fell backwards on the sidewalk and fractured her skull. She reports that she was told that she then tried to get up and fell forward fracturing her eye socket and jaw. The patient reports that she has no recall at all for these events. The patient questions what actually happened. No toxicology study was done. She does acknowledge that she had been consuming alcohol. The patient reports that since his accident that she has had ongoing confusion, executive functioning difficulties, difficulties with attention and concentration, memory difficulties, and a change in her sense of taste or smell. She has taste aversions to foods that she used to like. The patient does have  a history of being diagnosed with bipolar 1 disorder and that she has had increasing anxiety and agitation since this accident.  Interventions Strategy:  Cognitive Behavioral interventions and working on neuropsychological coping and adapting skills around residual cognitive issues following TBI  Participation Level:   Active  Participation Quality:  Appropriate and Attentive      Behavioral Observation:  Well Groomed, Alert, and Appropriate.   Current Psychosocial Factors: The patient reports that she has had to try to help with finding a nursing home placement for her mother.  The mother had been in a facility and her brother's home town but there was not may need to choose from and they were not very nice are appropriate.  The patient reports that she is worked very hard during this time and is had great difficulty from a cognitive standpoint being able to keep up with all that is needed.  The patient reports that the struggle have reinforced her difficulties related to ongoing issues related to her traumatic brain injury..  Content of Session:   Reviewed current symptoms and worked on planning and coping with residual neuropsychological deficits following TBI  Current Status:   The patient continues with memory and executive functioning issues.  She has had difficulty keeping up with what she needs to do as far as trying to buy and sell cars.  The paperwork, review and execution of these things have been very difficult and she cannot manage more than  one car at a time and is been nearly 2 months since she has completed a transaction.  The patient reports that she continues to have great cognitive difficulties with focus and attention and executive functioning issues.  However, the patient reports that she is working very hard to try to do what is needed to achieve as much as she can.  She is trying to get up every day and be prepared to go out and go out and do as much as she can but her  effectiveness and executive functioning continue to be quite problematic.  Patient Progress:   The patient is working hard to develop appropriate coping responses to life stressors and changes in life functioning.    Target Goals:   Help develop skills and coping resourses.  Last Reviewed:   07/19/2017  Impression/Diagnosis:   The patient is a 52 year old female who suffered a significant fall in 2016. She fell backwards and fractured her skull and was told that she tried to stand up and then fell forward fracturing her eye socket and jaw. She has had changes in her sense of taste and smell, ongoing attention concentration difficulties, executive functioning difficulties, memory difficulties, and increasing anxiety and worry over her difficulties. The patient reports that she has been trying to do what she can but she cannot work right now. 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 The patient has been struggling with the way others in her family have been handling her difficulties in trying to figure out what exactly is going on to cause all of her cognitive issues. She does have a prior history of bipolar affective disorder but it appears to be well managed at this time and has been for some time.  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

## 2017-07-30 ENCOUNTER — Encounter: Payer: Self-pay | Admitting: Physical Medicine & Rehabilitation

## 2017-07-30 ENCOUNTER — Encounter: Payer: Self-pay | Attending: Physical Medicine & Rehabilitation | Admitting: Physical Medicine & Rehabilitation

## 2017-07-30 VITALS — BP 113/64 | HR 70

## 2017-07-30 DIAGNOSIS — S069X9S Unspecified intracranial injury with loss of consciousness of unspecified duration, sequela: Secondary | ICD-10-CM

## 2017-07-30 DIAGNOSIS — F3170 Bipolar disorder, currently in remission, most recent episode unspecified: Secondary | ICD-10-CM

## 2017-07-30 DIAGNOSIS — M810 Age-related osteoporosis without current pathological fracture: Secondary | ICD-10-CM | POA: Insufficient documentation

## 2017-07-30 DIAGNOSIS — S069X0S Unspecified intracranial injury without loss of consciousness, sequela: Secondary | ICD-10-CM | POA: Insufficient documentation

## 2017-07-30 DIAGNOSIS — Z79899 Other long term (current) drug therapy: Secondary | ICD-10-CM | POA: Insufficient documentation

## 2017-07-30 DIAGNOSIS — G3189 Other specified degenerative diseases of nervous system: Secondary | ICD-10-CM

## 2017-07-30 DIAGNOSIS — H8110 Benign paroxysmal vertigo, unspecified ear: Secondary | ICD-10-CM

## 2017-07-30 DIAGNOSIS — Z5181 Encounter for therapeutic drug level monitoring: Secondary | ICD-10-CM | POA: Insufficient documentation

## 2017-07-30 DIAGNOSIS — S069X3S Unspecified intracranial injury with loss of consciousness of 1 hour to 5 hours 59 minutes, sequela: Secondary | ICD-10-CM

## 2017-07-30 DIAGNOSIS — N189 Chronic kidney disease, unspecified: Secondary | ICD-10-CM | POA: Insufficient documentation

## 2017-07-30 DIAGNOSIS — G44309 Post-traumatic headache, unspecified, not intractable: Secondary | ICD-10-CM | POA: Insufficient documentation

## 2017-07-30 DIAGNOSIS — F3161 Bipolar disorder, current episode mixed, mild: Secondary | ICD-10-CM | POA: Insufficient documentation

## 2017-07-30 DIAGNOSIS — S069X2S Unspecified intracranial injury with loss of consciousness of 31 minutes to 59 minutes, sequela: Secondary | ICD-10-CM | POA: Insufficient documentation

## 2017-07-30 DIAGNOSIS — F09 Unspecified mental disorder due to known physiological condition: Secondary | ICD-10-CM

## 2017-07-30 DIAGNOSIS — F419 Anxiety disorder, unspecified: Secondary | ICD-10-CM | POA: Insufficient documentation

## 2017-07-30 DIAGNOSIS — F310 Bipolar disorder, current episode hypomanic: Secondary | ICD-10-CM

## 2017-07-30 DIAGNOSIS — I669 Occlusion and stenosis of unspecified cerebral artery: Secondary | ICD-10-CM | POA: Insufficient documentation

## 2017-07-30 MED ORDER — TRAZODONE HCL 100 MG PO TABS: 100.0000 mg | ORAL_TABLET | Freq: Every day | ORAL | 5 refills | Status: DC

## 2017-07-30 MED ORDER — AMPHETAMINE-DEXTROAMPHETAMINE 10 MG PO TABS: 10.0000 mg | ORAL_TABLET | Freq: Two times a day (BID) | ORAL | 0 refills | Status: DC

## 2017-07-30 MED ORDER — CLONAZEPAM 0.5 MG PO TABS: 0.2500 mg | ORAL_TABLET | Freq: Two times a day (BID) | ORAL | 2 refills | Status: DC

## 2017-07-30 MED ORDER — ALPRAZOLAM 0.5 MG PO TABS: 0.5000 mg | ORAL_TABLET | ORAL | 2 refills | Status: DC

## 2017-07-30 NOTE — Progress Notes (Signed)
Subjective:    Patient ID: Laurie Booker, female    DOB: 03/23/1966, 52 y.o.   MRN: [id number]  HPI  Laurie Booker is here in follow up of her post  Concussion syndrome. She has continued to struggle at times with anxiety, especially situation with family and friends. She feels that the adderall helps with her focus but tend to make her too "amped up." she is only using xanax daily in the evening.   Despite her cognitive shortcomings she has remained busy with her family. She is still buying and selling cars.   Sleep has been excellent still on trazodone.,   Pain Inventory Average Pain 3 Pain Right Now 2 My pain is dull  In the last 24 hours, has pain interfered with the following? General activity 3 Relation with others 3 Enjoyment of life 3 What TIME of day is your pain at its worst? evening Sleep (in general) Fair  Pain is worse with: some activites Pain improves with: rest and medication Relief from Meds: 8  Mobility ability to climb steps?  yes do you drive?  yes  Function not employed: date last employed .  Neuro/Psych tremor confusion depression anxiety  Prior Studies Any changes since last visit?  no  Physicians involved in your care Any changes since last visit?  no   No family history on file. Social History   Socioeconomic History  . Marital status: Legally Separated    Spouse name: Not on file  . Number of children: Not on file  . Years of education: Not on file  . Highest education level: Not on file  Social Needs  . Financial resource strain: Not on file  . Food insecurity - worry: Not on file  . Food insecurity - inability: Not on file  . Transportation needs - medical: Not on file  . Transportation needs - non-medical: Not on file  Occupational History  . Not on file  Tobacco Use  . Smoking status: Never Smoker  . Smokeless tobacco: Never Used  Substance and Sexual Activity  . Alcohol use: Yes    Comment: socially  . Drug use: Not on file    . Sexual activity: Not on file  Other Topics Concern  . Not on file  Social History Narrative  . Not on file   Past Surgical History:  Procedure Laterality Date  . CESAREAN SECTION    . COLONOSCOPY W/ POLYPECTOMY    . DILATION AND CURETTAGE OF UTERUS    . MICROLARYNGOSCOPY WITH CO2 LASER AND EXCISION OF VOCAL CORD LESION N/A 03/19/2015   Procedure: MICROLARYNGOSCOPY WITH CO2 LASER AND EXCISION OF VOCAL CORD LESION;  Surgeon: Christia Readingwight Bates, MD;  Location: [facility] OR;  Service: ENT;  Laterality: N/A;  Micro Direct Laryngoscopy with co2 laser excision of bilateral vocal cord granulomas/jet venturi ventilation  . RHINOPLASTY    . SEPTOPLASTY    . vocal cord surgery     granulomas removed after intubation d/t suicide attempt   Past Medical History:  Diagnosis Date  . Anxiety   . Bipolar disorder (HCC)   . Blood clots in brain    d/t traumatic brain injury Aug 2016  . Chronic kidney disease    follows up with Dr. Lequita HaltLufadeju: nephrologist in [place]. Pt cannot state what is wrong with her kidneys; just that she has issues with them. (numerous renal cysts on 01/2015 CT)  . Depression    behavioral health admission 2009 or 2010  . H/O suicide attempt 2009  .  H/O transfusion of packed red blood cells   . Osteoporosis   . Traumatic brain injury [facility])    with loss of consciousness, intubated, at [facility] August 2016, closed, hemorrhage   LMP  (LMP Unknown) Comment: trauma  Opioid Risk Score:   Fall Risk Score:  `1  Depression screen PHQ 2/9  Depression screen [facility] 2/9 04/10/2017 03/30/2015  Decreased Interest 1 1  Down, Depressed, Hopeless 1 2  PHQ - 2 Score 2 3  Altered sleeping - 2  Tired, decreased energy - 2  Change in appetite - 1  Feeling bad or failure about yourself  - 2  Trouble concentrating - 2  Moving slowly or fidgety/restless - 2  Suicidal thoughts - 1  PHQ-9 Score - 15  Difficult doing work/chores - Very difficult    Review of Systems  Constitutional: Negative.   HENT:  Negative.   Eyes: Negative.   Respiratory: Negative.   Cardiovascular: Negative.   Gastrointestinal: Negative.   Endocrine: Negative.   Genitourinary: Negative.   Musculoskeletal: Negative.   Skin: Negative.   Allergic/Immunologic: Negative.   Neurological: Negative.   Hematological: Negative.   Psychiatric/Behavioral: Negative.   All other systems reviewed and are negative.      Objective:   Physical Exam General: NAD.  HEENT:PERRL,  Neck:Supple  Heart: RRR Chest: reg effort Abdomen:Soft, non-tender, non-distended, bowel sounds + Extremities:No edema Skin:Clean and intact  Neuro:Oriented x 3. movess all 4's. Normal sensation.  Musculoskeletal:normal effort.  Psych:good insight and awareness. Focus is improving but tends to be tangential and often doesn't stop to take a "breath" at times when discussing her issues..   Assessment & Plan:  1. Traumatic brain injury 01/31/15 after fall --SAH, Right frontal contusion, Right middle cranial fossa EDH  -pt with subsequent cognitive/behavioral deficits post injury  -improving awareness and cognition. Attention and anxiety are still obstacles 2. Post traumatic headaches--improved. Sometimes worsen with stress  3. Hx of bipolar disorder 4. Neurogenic bladder. resolved 5. BPPV 6. Nephrogenic diabetes insipidus    Plan:  1. Continue with neuropsychology follow up/testing and to also address bipolar/anxiety/coping issues. Needs to continue focusing on coping skills to deal with anxiety, family relations,  and perseverative behaviors.  2. Lamictal, lithium, for her bipolar disorder. Continue xanax at 0.5mg  bid scheduled with a prn dose if needed.  -vpa?  3. Trazodone 100mg  qhs. Sleeping well now.  4. Change xanax to prn. Add klonopin for low dose 0.25mg  bid for anxiety and perseverative behaviors.  5. Continue with low dose adderall 10mg  qd to bid, a second rx was provided.   6. Gaze stabilization exercises  shall continue 7. Follow up with me in  2 months. of face to face patient care time were spent during this visit. All questions were encouraged and answered.  8. Diabetes insipidus: lithium reduced to help with this -endocrine       Her psychiatrist in [place]: Raeford Razor, MD, pHD [phone]. She actively remains under his care.

## 2017-07-30 NOTE — Patient Instructions (Signed)
PLEASE FEEL FREE TO CALL OUR OFFICE WITH ANY PROBLEMS OR QUESTIONS ([phone])      

## 2017-09-18 ENCOUNTER — Encounter: Payer: Self-pay | Attending: Physical Medicine & Rehabilitation | Admitting: Psychology

## 2017-09-18 DIAGNOSIS — S069X9S Unspecified intracranial injury with loss of consciousness of unspecified duration, sequela: Secondary | ICD-10-CM

## 2017-09-18 DIAGNOSIS — G44309 Post-traumatic headache, unspecified, not intractable: Secondary | ICD-10-CM | POA: Insufficient documentation

## 2017-09-18 DIAGNOSIS — N189 Chronic kidney disease, unspecified: Secondary | ICD-10-CM | POA: Insufficient documentation

## 2017-09-18 DIAGNOSIS — F09 Unspecified mental disorder due to known physiological condition: Secondary | ICD-10-CM

## 2017-09-18 DIAGNOSIS — F3161 Bipolar disorder, current episode mixed, mild: Secondary | ICD-10-CM | POA: Insufficient documentation

## 2017-09-18 DIAGNOSIS — I669 Occlusion and stenosis of unspecified cerebral artery: Secondary | ICD-10-CM | POA: Insufficient documentation

## 2017-09-18 DIAGNOSIS — S069X2S Unspecified intracranial injury with loss of consciousness of 31 minutes to 59 minutes, sequela: Secondary | ICD-10-CM | POA: Insufficient documentation

## 2017-09-18 DIAGNOSIS — S069X0S Unspecified intracranial injury without loss of consciousness, sequela: Secondary | ICD-10-CM | POA: Insufficient documentation

## 2017-09-18 DIAGNOSIS — M810 Age-related osteoporosis without current pathological fracture: Secondary | ICD-10-CM | POA: Insufficient documentation

## 2017-09-18 DIAGNOSIS — F419 Anxiety disorder, unspecified: Secondary | ICD-10-CM | POA: Insufficient documentation

## 2017-09-18 DIAGNOSIS — Z79899 Other long term (current) drug therapy: Secondary | ICD-10-CM | POA: Insufficient documentation

## 2017-09-18 DIAGNOSIS — F310 Bipolar disorder, current episode hypomanic: Secondary | ICD-10-CM

## 2017-09-18 DIAGNOSIS — G3189 Other specified degenerative diseases of nervous system: Secondary | ICD-10-CM

## 2017-09-18 DIAGNOSIS — Z5181 Encounter for therapeutic drug level monitoring: Secondary | ICD-10-CM | POA: Insufficient documentation

## 2017-09-20 ENCOUNTER — Encounter: Payer: Self-pay | Admitting: Psychology

## 2017-09-20 NOTE — Progress Notes (Signed)
Patient:  Laurie Booker   DOB: 12-29-1965  MR Number: [id number]  Location: [facility] FOR PAIN AND REHABILITATIVE MEDICINE [facility] PHYSICAL MEDICINE AND REHABILITATION [address], [place] [address] [id number] [place] [address] Dept: [phone]  Start: 9 AM End: 10 AM  Provider/Observer:     Hershal Coria PSYD  Chief Complaint:      Chief Complaint  Patient presents with  . Depression  . Memory Loss    Reason For Service:     The patient is a 52 year old female who is referred for psychological/neuropsychological interventions. The patient was involved in a fall in 2016. She reports that she had been at a local bar with the friend in a couple of other people. She reports that she remembers having a conversation with one of the people in the bar and has very little to no memory of events after that. She reports that she has been told that she got into an [place] with them and she into other gentleman apparently went to one of the gentleman's house. She reports that this is extremely unusual and out of character for her and that she does not think she would ever go voluntarily with people that she went with. The patient reports that she was told that when they arrived at the house that she fell backwards on the sidewalk and fractured her skull. She reports that she was told that she then tried to get up and fell forward fracturing her eye socket and jaw. The patient reports that she has no recall at all for these events. The patient questions what actually happened. No toxicology study was done. She does acknowledge that she had been consuming alcohol. The patient reports that since his accident that she has had ongoing confusion, executive functioning difficulties, difficulties with attention and concentration, memory difficulties, and a change in her sense of taste or smell. She has taste aversions to foods that she used to like. The patient does have a history of being  diagnosed with bipolar 1 disorder and that she has had increasing anxiety and agitation since this accident.  Interventions Strategy:  Cognitive Behavioral interventions and working on neuropsychological coping and adapting skills around residual cognitive issues following TBI  Participation Level:   Active  Participation Quality:  Appropriate and Attentive      Behavioral Observation:  Well Groomed, Alert, and Appropriate.   Current Psychosocial Factors: The patient reports that she is now been approved for Social Security disability which has alleviated his major stressor for her regarding her ongoing financial stability.  While these benefits that she may become eligible for will not solvable all of her financial difficulties they are reducing some of the major stress she has been experiencing.  The patient reports that the relationship with her family has been improving some and the fact that she has been granted her social security disability and the fact that they are having a better understanding about her ongoing cognitive difficulties have helped.  The patient reports that she is also been dealing with her mother is suffering from significant dementia and had been in a nursing home in [place].  The patient's mother is now been brought to [facility] and the patient has been helping with her mother's placement and improvement in care.  The patient reports that this is helped the patient self-esteem being able to do something in her life that is functional in some way.  Content of Session:   Reviewed current symptoms and  worked on planning and coping with residual neuropsychological deficits following TBI  Current Status:   The patient continues with memory and executive functioning issues.  However, the patient is now been granted her Social Security disability and some of the stress is diminished.  The patient reports that she has been doing her best to try to help her mother get a new nursing  home placement in [place] rather than [place].  The patient reports that this was a significant challenge for the patient dealing with all the paperwork and keeping up with things that she had to try to remember.  She continues to have significant cognitive deficits resulting from her traumatic brain injury.  Patient Progress:   The patient is working hard to develop appropriate coping responses to life stressors and changes in life functioning.    Target Goals:   Help develop skills and coping resourses.  Last Reviewed:   09/17/2017  Impression/Diagnosis:   The patient is a 52 year old female who suffered a significant fall in 2016. She fell backwards and fractured her skull and was told that she tried to stand up and then fell forward fracturing her eye socket and jaw. She has had changes in her sense of taste and smell, ongoing attention concentration difficulties, executive functioning difficulties, memory difficulties, and increasing anxiety and worry over her difficulties. The patient reports that she has been trying to do what she can but she cannot work right now. 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 The patient has been struggling with the way others in her family have been handling her difficulties in trying to figure out what exactly is going on to cause all of her cognitive issues. She does have a prior history of bipolar affective disorder but it appears to be well managed at this time and has been for some time.  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

## 2017-09-26 ENCOUNTER — Encounter: Payer: Self-pay | Attending: Physical Medicine & Rehabilitation | Admitting: Physical Medicine & Rehabilitation

## 2017-09-26 ENCOUNTER — Encounter: Payer: Self-pay | Admitting: Physical Medicine & Rehabilitation

## 2017-09-26 DIAGNOSIS — Z5181 Encounter for therapeutic drug level monitoring: Secondary | ICD-10-CM | POA: Insufficient documentation

## 2017-09-26 DIAGNOSIS — S069X9S Unspecified intracranial injury with loss of consciousness of unspecified duration, sequela: Secondary | ICD-10-CM

## 2017-09-26 DIAGNOSIS — S069X0S Unspecified intracranial injury without loss of consciousness, sequela: Secondary | ICD-10-CM | POA: Insufficient documentation

## 2017-09-26 DIAGNOSIS — F419 Anxiety disorder, unspecified: Secondary | ICD-10-CM | POA: Insufficient documentation

## 2017-09-26 DIAGNOSIS — G44309 Post-traumatic headache, unspecified, not intractable: Secondary | ICD-10-CM | POA: Insufficient documentation

## 2017-09-26 DIAGNOSIS — M810 Age-related osteoporosis without current pathological fracture: Secondary | ICD-10-CM | POA: Insufficient documentation

## 2017-09-26 DIAGNOSIS — S069X2S Unspecified intracranial injury with loss of consciousness of 31 minutes to 59 minutes, sequela: Secondary | ICD-10-CM | POA: Insufficient documentation

## 2017-09-26 DIAGNOSIS — N189 Chronic kidney disease, unspecified: Secondary | ICD-10-CM | POA: Insufficient documentation

## 2017-09-26 DIAGNOSIS — F310 Bipolar disorder, current episode hypomanic: Secondary | ICD-10-CM

## 2017-09-26 DIAGNOSIS — Z79899 Other long term (current) drug therapy: Secondary | ICD-10-CM | POA: Insufficient documentation

## 2017-09-26 DIAGNOSIS — F3161 Bipolar disorder, current episode mixed, mild: Secondary | ICD-10-CM | POA: Insufficient documentation

## 2017-09-26 DIAGNOSIS — I669 Occlusion and stenosis of unspecified cerebral artery: Secondary | ICD-10-CM | POA: Insufficient documentation

## 2017-09-26 MED ORDER — AMPHETAMINE-DEXTROAMPHETAMINE 10 MG PO TABS: 10.0000 mg | ORAL_TABLET | Freq: Two times a day (BID) | ORAL | 0 refills | Status: DC

## 2017-09-26 NOTE — Progress Notes (Signed)
Subjective:    Patient ID: Laurie Booker, female    DOB: 04-29-1966, 52 y.o.   MRN: [id number]  HPI   Laurie Booker is here in follow up of her TBI and associated cognitive deficits. She was approved for disability last week which has taken an emotional load off of her. It has helped from a family standpoint as it "legitimized" her condition in the eyes of her family members. She has continued follow up with neuropsychology for the emotional and cognitive sequelae of her injury.   She is using adderall when she needs to focus more (cleaning up at home, tending to family matters, leaving the house). She tends to use it more often than not.       Pain Inventory Average Pain 5 Pain Right Now 0 My pain is stabbing and aching  In the last 24 hours, has pain interfered with the following? General activity 3 Relation with others 3 Enjoyment of life 5 What TIME of day is your pain at its worst? daytime Sleep (in general) Fair  Pain is worse with: bending and some activites Pain improves with: medication Relief from Meds: 8  Mobility walk without assistance ability to climb steps?  yes do you drive?  yes  Function disabled: date disabled . I need assistance with the following:  household duties and shopping  Neuro/Psych tremor dizziness depression anxiety  Prior Studies Any changes since last visit?  no  Physicians involved in your care Any changes since last visit?  no   No family history on file. Social History   Socioeconomic History  . Marital status: Legally Separated    Spouse name: Not on file  . Number of children: Not on file  . Years of education: Not on file  . Highest education level: Not on file  Occupational History  . Not on file  Social Needs  . Financial resource strain: Not on file  . Food insecurity:    Worry: Not on file    Inability: Not on file  . Transportation needs:    Medical: Not on file    Non-medical: Not on file  Tobacco Use  .  Smoking status: Never Smoker  . Smokeless tobacco: Never Used  Substance and Sexual Activity  . Alcohol use: Yes    Comment: socially  . Drug use: Not on file  . Sexual activity: Not on file  Lifestyle  . Physical activity:    Days per week: Not on file    Minutes per session: Not on file  . Stress: Not on file  Relationships  . Social connections:    Talks on phone: Not on file    Gets together: Not on file    Attends religious service: Not on file    Active member of club or organization: Not on file    Attends meetings of clubs or organizations: Not on file    Relationship status: Not on file  Other Topics Concern  . Not on file  Social History Narrative  . Not on file   Past Surgical History:  Procedure Laterality Date  . CESAREAN SECTION    . COLONOSCOPY W/ POLYPECTOMY    . DILATION AND CURETTAGE OF UTERUS    . MICROLARYNGOSCOPY WITH CO2 LASER AND EXCISION OF VOCAL CORD LESION N/A 03/19/2015   Procedure: MICROLARYNGOSCOPY WITH CO2 LASER AND EXCISION OF VOCAL CORD LESION;  Surgeon: Christia Reading, MD;  Location: [facility] OR;  Service: ENT;  Laterality: N/A;  Micro Direct Laryngoscopy with  co2 laser excision of bilateral vocal cord granulomas/jet venturi ventilation  . RHINOPLASTY    . SEPTOPLASTY    . vocal cord surgery     granulomas removed after intubation d/t suicide attempt   Past Medical History:  Diagnosis Date  . Anxiety   . Bipolar disorder (HCC)   . Blood clots in brain    d/t traumatic brain injury Aug 2016  . Chronic kidney disease    follows up with Dr. Lequita HaltLufadeju: nephrologist in [place]. Pt cannot state what is wrong with her kidneys; just that she has issues with them. (numerous renal cysts on 01/2015 CT)  . Depression    behavioral health admission 2009 or 2010  . H/O suicide attempt 2009  . H/O transfusion of packed red blood cells   . Osteoporosis   . Traumatic brain injury [facility](HCC)    with [facility] of consciousness[facility], closed, hemorrhage    LMP  (LMP Unknown) Comment: trauma  Opioid Risk Score:   Fall Risk Score:  `1  Depression screen PHQ 2/9  Depression [facility] 04/10/2017 03/30/2015  Decreased Interest 1 1  Down, Depressed, Hopeless 1 2  PHQ - 2 Score 2 3  Altered sleeping - 2  Tired, decreased energy - 2  Change in appetite - 1  Feeling bad or failure about yourself  - 2  Trouble concentrating - 2  Moving slowly or fidgety/restless - 2  Suicidal thoughts - 1  PHQ-9 Score - 15  Difficult doing work/chores - Very difficult     Review of Systems  Constitutional: Negative.   HENT: Negative.   Eyes: Negative.   Respiratory: Negative.   Cardiovascular: Negative.   Gastrointestinal: Negative.   Endocrine: Negative.   Genitourinary: Negative.   Musculoskeletal: Negative.   Skin: Negative.   Allergic/Immunologic: Negative.   Neurological: Positive for dizziness.  Hematological: Negative.   Psychiatric/Behavioral: Positive for dysphoric mood. The patient is nervous/anxious.   All other systems reviewed and are negative.      Objective:   Physical Exam General: No acute distress HEENT: EOMI, oral membranes moist Cards: reg rate  Chest: normal effort Abdomen: Soft, NT, ND Skin: dry, intact Extremities: no edema Skin:Clean and intact  Neuro:Oriented x 3. movess all 4's. Normal sensation. Good insight and awareness. Attention is functional for conversation. Memory intact for recent information Musculoskeletal:normal effort.  Psych:pt pleasant, slightly anxious   Assessment & Plan:  1. Traumatic brain injury 01/31/15 after fall --SAH, Right frontal contusion, Right middle cranial fossa EDH  -pt with subsequent cognitive/behavioral deficits post injury  -improved awareness and cognition.  -Attention and anxiety have been obstacles although the approval of her disability has helped from a emotional and psychosocial standpoint.  2. Post traumatic headaches--improved. Can increase with  stress,fatigue  3. Hx of bipolar disorder 4. Neurogenic bladder. resolved 5. BPPV 6. Nephrogenic diabetes insipidus    Plan:  1. Continue with neuropsychology follow up  -self awareness of deficits and coping strategies has helped. 2. Lamictal, lithium, for her bipolar disorder.    -Continue xanax at 0.5mg  bid scheduled with a prn dose if needed.  3. Trazodone 100mg  qhs.Sleeping well now.  4. Change xanax to prn. Add klonopin for low dose 0.25mg  bid for anxiety and perseverative behaviors. 5.Continue with low dose adderall 10mg  qd to bid/ bid prn.  6. Gaze stabilization exercisesshall continue 7. Follow up with me in  4 months. 15minutes of face to face patient care time were spent during  this visit. All questions were encouraged and answered.  8. Diabetes insipidus: lithium reduction has helpedwith this -endocrine      Her psychiatrist in [place]: Raeford Razor, MD, pHD [phone]. She actively remains under his care.

## 2017-09-26 NOTE — Patient Instructions (Signed)
PLEASE FEEL FREE TO CALL OUR OFFICE WITH ANY PROBLEMS OR QUESTIONS ([phone])      

## 2017-11-08 ENCOUNTER — Encounter: Payer: Self-pay | Admitting: Psychology

## 2017-11-08 ENCOUNTER — Encounter: Payer: Self-pay | Attending: Physical Medicine & Rehabilitation | Admitting: Psychology

## 2017-11-08 DIAGNOSIS — F310 Bipolar disorder, current episode hypomanic: Secondary | ICD-10-CM

## 2017-11-08 DIAGNOSIS — G44309 Post-traumatic headache, unspecified, not intractable: Secondary | ICD-10-CM | POA: Insufficient documentation

## 2017-11-08 DIAGNOSIS — F419 Anxiety disorder, unspecified: Secondary | ICD-10-CM | POA: Insufficient documentation

## 2017-11-08 DIAGNOSIS — N189 Chronic kidney disease, unspecified: Secondary | ICD-10-CM | POA: Insufficient documentation

## 2017-11-08 DIAGNOSIS — S069X2S Unspecified intracranial injury with loss of consciousness of 31 minutes to 59 minutes, sequela: Secondary | ICD-10-CM | POA: Insufficient documentation

## 2017-11-08 DIAGNOSIS — I669 Occlusion and stenosis of unspecified cerebral artery: Secondary | ICD-10-CM | POA: Insufficient documentation

## 2017-11-08 DIAGNOSIS — F09 Unspecified mental disorder due to known physiological condition: Secondary | ICD-10-CM

## 2017-11-08 DIAGNOSIS — S069X0S Unspecified intracranial injury without loss of consciousness, sequela: Secondary | ICD-10-CM | POA: Insufficient documentation

## 2017-11-08 DIAGNOSIS — G3189 Other specified degenerative diseases of nervous system: Secondary | ICD-10-CM

## 2017-11-08 DIAGNOSIS — Z5181 Encounter for therapeutic drug level monitoring: Secondary | ICD-10-CM | POA: Insufficient documentation

## 2017-11-08 DIAGNOSIS — M810 Age-related osteoporosis without current pathological fracture: Secondary | ICD-10-CM | POA: Insufficient documentation

## 2017-11-08 DIAGNOSIS — Z79899 Other long term (current) drug therapy: Secondary | ICD-10-CM | POA: Insufficient documentation

## 2017-11-08 DIAGNOSIS — F3161 Bipolar disorder, current episode mixed, mild: Secondary | ICD-10-CM | POA: Insufficient documentation

## 2017-11-08 DIAGNOSIS — S069X9S Unspecified intracranial injury with loss of consciousness of unspecified duration, sequela: Secondary | ICD-10-CM

## 2017-11-08 NOTE — Progress Notes (Signed)
Patient:  Laurie Booker   DOB: 06/14/1966  MR Number: [id number]  Location: [facility] MEDICINE AND REHABILITATION [address] [place] [address] [id number] [place] [address] Dept: [phone]  Start: 9 AM End: 10 AM  Provider/Observer:     Hershal Coria PSYD  Chief Complaint:      Chief Complaint  Patient presents with  . Depression  . Memory Loss  . Stress    Reason For Service:     The patient is a 52 year old female who is referred for psychological/neuropsychological interventions. The patient was involved in a fall in 2016. She reports that she had been at a local bar with the friend in a couple of other people. She reports that she remembers having a conversation with one of the people in the bar and has very little to no memory of events after that. She reports that she has been told that she got into an [place] with them and she into other gentleman apparently went to one of the gentleman's house. She reports that this is extremely unusual and out of character for her and that she does not think she would ever go voluntarily with people that she went with. The patient reports that she was told that when they arrived at the house that she fell backwards on the sidewalk and fractured her skull. She reports that she was told that she then tried to get up and fell forward fracturing her eye socket and jaw. The patient reports that she has no recall at all for these events. The patient questions what actually happened. No toxicology study was done. She does acknowledge that she had been consuming alcohol. The patient reports that since his accident that she has had ongoing confusion, executive functioning difficulties, difficulties with attention and concentration, memory difficulties, and a change in her sense of taste or smell. She has taste aversions to foods that she used to like. The patient does have a history of  being diagnosed with bipolar 1 disorder and that she has had increasing anxiety and agitation since this accident.  The above reason for service and history remains accurate for the current appointment.  It has been reviewed.  The patient reports that she has made some significant improvements in her overall functioning but continues to struggle with ongoing cognitive difficulties especially under stressful situations.  Interventions Strategy:  Cognitive/behavioral psychotherapeutic interventions and working on neuropsychological coping and adaptive skills around residual cognitive deficits.  Participation Level:   Active  Participation Quality:  Appropriate and Attentive      Behavioral Observation:  Well Groomed, Alert, and Appropriate.   Current Psychosocial Factors: The patient reports that she has been doing better overall with her dealing with and coping with various family members.  She does have some significant concerns about how her brother is managed her remaining money and is quite concerned that a significant portion of this is disappeared.  The patient reports that she is managing herself with regard to her Social Security money and keeping up with her expenses.  The patient reports that she has been doing things around her house to keep it up but needing help from her children.  The patient reports that she does continue to have difficulty with executive functioning deficits.  Content of Session:   Reviewed current symptoms and continue to work on planning and coping mechanisms to adapt to her residual neuropsychological deficits following her TBI.  Current  Status:   The patient reports that she is coping better with her memory and executive functioning issues.  She is writing more things down and spending more time making decisions and planning out actions.  The patient realizes that there are times when she ends up doing too much physical activities which wear her down.  She reports  that when she is physically fatigued that her cognitive difficulties worsen.  Patient Progress:   The patient continues to actively work on improving and building her coping resources to deal with various life stressors and residual cognitive deficits.  Target Goals:   Help develop skills and coping resourses.  Last Reviewed:   11/08/2017  Impression/Diagnosis:   The patient is a 52 year old female who suffered a significant fall in 2016. She fell backwards and fractured her skull and was told that she tried to stand up and then fell forward fracturing her eye socket and jaw. She has had changes in her sense of taste and smell, ongoing attention concentration difficulties, executive functioning difficulties, memory difficulties, and increasing anxiety and worry over her difficulties. The patient reports that she has been trying to do what she can but she cannot work right now. 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 The patient has been struggling with the way others in her family have been handling her difficulties in trying to figure out what exactly is going on to cause all of her cognitive issues. She does have a prior history of bipolar affective disorder but it appears to be well managed at this time and has been for some time.  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

## 2017-11-28 ENCOUNTER — Other Ambulatory Visit: Payer: Self-pay | Admitting: Physical Medicine & Rehabilitation

## 2017-11-28 DIAGNOSIS — S069X0S Unspecified intracranial injury without loss of consciousness, sequela: Secondary | ICD-10-CM

## 2017-11-28 DIAGNOSIS — F310 Bipolar disorder, current episode hypomanic: Secondary | ICD-10-CM

## 2017-11-28 DIAGNOSIS — G44309 Post-traumatic headache, unspecified, not intractable: Secondary | ICD-10-CM

## 2018-01-17 ENCOUNTER — Encounter: Payer: Self-pay | Attending: Physical Medicine & Rehabilitation | Admitting: Psychology

## 2018-01-17 ENCOUNTER — Telehealth: Payer: Self-pay | Admitting: Registered Nurse

## 2018-01-17 DIAGNOSIS — F3161 Bipolar disorder, current episode mixed, mild: Secondary | ICD-10-CM | POA: Insufficient documentation

## 2018-01-17 DIAGNOSIS — I669 Occlusion and stenosis of unspecified cerebral artery: Secondary | ICD-10-CM | POA: Insufficient documentation

## 2018-01-17 DIAGNOSIS — S069XAS Unspecified intracranial injury with loss of consciousness status unknown, sequela: Secondary | ICD-10-CM

## 2018-01-17 DIAGNOSIS — S069X9S Unspecified intracranial injury with loss of consciousness of unspecified duration, sequela: Secondary | ICD-10-CM

## 2018-01-17 DIAGNOSIS — F310 Bipolar disorder, current episode hypomanic: Secondary | ICD-10-CM

## 2018-01-17 DIAGNOSIS — Z5181 Encounter for therapeutic drug level monitoring: Secondary | ICD-10-CM | POA: Insufficient documentation

## 2018-01-17 DIAGNOSIS — N189 Chronic kidney disease, unspecified: Secondary | ICD-10-CM | POA: Insufficient documentation

## 2018-01-17 DIAGNOSIS — F419 Anxiety disorder, unspecified: Secondary | ICD-10-CM | POA: Insufficient documentation

## 2018-01-17 DIAGNOSIS — G3189 Other specified degenerative diseases of nervous system: Secondary | ICD-10-CM

## 2018-01-17 DIAGNOSIS — G44309 Post-traumatic headache, unspecified, not intractable: Secondary | ICD-10-CM | POA: Insufficient documentation

## 2018-01-17 DIAGNOSIS — S069X0S Unspecified intracranial injury without loss of consciousness, sequela: Secondary | ICD-10-CM | POA: Insufficient documentation

## 2018-01-17 DIAGNOSIS — F09 Unspecified mental disorder due to known physiological condition: Secondary | ICD-10-CM

## 2018-01-17 DIAGNOSIS — Z79899 Other long term (current) drug therapy: Secondary | ICD-10-CM | POA: Insufficient documentation

## 2018-01-17 DIAGNOSIS — M810 Age-related osteoporosis without current pathological fracture: Secondary | ICD-10-CM | POA: Insufficient documentation

## 2018-01-17 DIAGNOSIS — S069X2S Unspecified intracranial injury with loss of consciousness of 31 minutes to 59 minutes, sequela: Secondary | ICD-10-CM | POA: Insufficient documentation

## 2018-01-17 MED ORDER — AMPHETAMINE-DEXTROAMPHETAMINE 10 MG PO TABS
10.0000 mg | ORAL_TABLET | Freq: Two times a day (BID) | ORAL | 0 refills | Status: DC
Start: 2018-01-17 — End: 2018-01-23

## 2018-01-17 NOTE — Telephone Encounter (Signed)
Laurie Booker seen Dr. Kieth Brightlyodenbough today and expressed she will run out of Adderall prior to her appointment with Dr. Riley KillSwartz. According to the PMP Aware Web-sitelast prescription was filled on 09/28/2017. Laurie Booker reports she doesn't take the Adderall on a daily basis just as needed. Adderall prescription sent, she is aware.

## 2018-01-19 ENCOUNTER — Encounter: Payer: Self-pay | Admitting: Psychology

## 2018-01-19 NOTE — Progress Notes (Signed)
Patient:  Laurie Booker   DOB: 07/28/1965  MR Number: [id number]  Location: [facility] [address], [place] [address] [id number] [place] [id number] Dept: [phone]  Start: 11 AM End: 12 PM  Provider/Observer:     Hershal CoriaJohn R Rockie Schnoor PsyD  Chief Complaint:      Chief Complaint  Patient presents with  . Memory Loss  . Other  . Anxiety  . Fatigue  . Stress    Reason For Service:     The patient is a 52 year old female who is referred for psychological/neuropsychological interventions. The patient was involved in a fall in 2016. She reports that she had been at a local bar with the friend in a couple of other people. She reports that she remembers having a conversation with one of the people in the bar and has very little to no memory of events after that. She reports that she has been told that she got into an [place] with them and she into other gentleman apparently went to one of the gentleman's house. She reports that this is extremely unusual and out of character for her and that she does not think she would ever go voluntarily with people that she went with. The patient reports that she was told that when they arrived at the house that she fell backwards on the sidewalk and fractured her skull. She reports that she was told that she then tried to get up and fell forward fracturing her eye socket and jaw. The patient reports that she has no recall at all for these events. The patient questions what actually happened. No toxicology study was done. She does acknowledge that she had been consuming alcohol. The patient reports that since his accident that she has had ongoing confusion, executive functioning difficulties, difficulties with attention and concentration, memory difficulties, and a change in her sense of taste or smell. She has taste aversions to foods that she used to like. The patient  does have a history of being diagnosed with bipolar 1 disorder and that she has had increasing anxiety and agitation since this accident.  The above reason for service and history remains accurate for the current appointment.  I have reviewed it for this appointment.  The patient reports that she has made some significant improvements in her functioning but has had a lot of stressors recently.  There have been 3 or 4 major events happen since I saw her last.  Interventions Strategy:  Cognitive/behavioral therapeutic interventions and working on neuropsychological coping and adaptive skills around residual cognitive deficits.  Participation Level:   Active  Participation Quality:  Appropriate and Attentive      Behavioral Observation:  Well Groomed, Alert, and Appropriate.   Current Psychosocial Factors: The patient came in reported that there have been 3 or 4 major events in her family recently.  The first was a situation where her daughter who is a Holiday representativesenior in college and has developed a friendship with a younger sorority sister who did the daughters become a Dance movement psychotherapistmentor for were at a party and the younger sorority sister started feeling poorly and while she was walking outside had a syncopal event falling and striking her head on a wall.  The sorority sister broke her neck and this fall and died.  Later autopsy showed that she had enlarged heart which likely triggered the syncopal event.  While all of this was going on her family plans  on coming to see her son graduate from high school.  This was very stressful for her as well.  The patient's son then developed mononucleosis and severe strep throat that required multiple emergency room and then hospitalizations for severe infection.  The patient did had to go to a funeral in [place] with her daughter.  While all of this was going on the patient's very close friend who lives in [place] husband died and the patient had to travel there for a funeral.  The  patient reports that this was nearly overwhelming to her but she reports that his usual she did well during the acute stress of the events but has had more anxiety including tremors in her hand following this.  Content of Session:   Reviewed current symptoms and continue to work on planning and coping mechanisms to adapt to her residual psychological deficits following her TBI.  Current Status:   The patient reports that she had been doing much better until the significant psychosocial events occurred as described above.  The patient reports that she was proud that she was able to get through all of these major stressors but reports that she is now feeling overwhelmed and has had a lot of anxiety.  We have been working on how to cope with the residual effects of all the stressors on top of her TBI.  Patient Progress:   The patient continues to actively work on improving and building her coping resources to deal with various life stressors and residual cognitive deficits.  Target Goals:   Help develop skills and coping resourses.  Last Reviewed:   11/08/2017  Impression/Diagnosis:   The patient is a 52 year old female who suffered a significant fall in 2016. She fell backwards and fractured her skull and was told that she tried to stand up and then fell forward fracturing her eye socket and jaw. She has had changes in her sense of taste and smell, ongoing attention concentration difficulties, executive functioning difficulties, memory difficulties, and increasing anxiety and worry over her difficulties. The patient reports that she has been trying to do what she can but she cannot work right now. 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 The patient has been struggling with the way others in her family have been handling her difficulties in trying to figure out what exactly is going on to cause all of her cognitive issues. She does have a prior history of bipolar affective  disorder but it appears to be well managed at this time and has been for some time.  The above impression/diagnoses have been reviewed for this visit and do remain appropriate and applicable for the current visit.  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

## 2018-01-23 ENCOUNTER — Encounter: Payer: Self-pay | Admitting: Physical Medicine & Rehabilitation

## 2018-01-23 ENCOUNTER — Other Ambulatory Visit: Payer: Self-pay

## 2018-01-23 DIAGNOSIS — S069X9S Unspecified intracranial injury with loss of consciousness of unspecified duration, sequela: Secondary | ICD-10-CM

## 2018-01-23 DIAGNOSIS — G3189 Other specified degenerative diseases of nervous system: Secondary | ICD-10-CM

## 2018-01-23 DIAGNOSIS — F310 Bipolar disorder, current episode hypomanic: Secondary | ICD-10-CM

## 2018-01-23 DIAGNOSIS — G44309 Post-traumatic headache, unspecified, not intractable: Secondary | ICD-10-CM

## 2018-01-23 DIAGNOSIS — S069X0S Unspecified intracranial injury without loss of consciousness, sequela: Secondary | ICD-10-CM

## 2018-01-23 DIAGNOSIS — S069XAS Unspecified intracranial injury with loss of consciousness status unknown, sequela: Secondary | ICD-10-CM

## 2018-01-23 DIAGNOSIS — F09 Unspecified mental disorder due to known physiological condition: Secondary | ICD-10-CM

## 2018-01-23 DIAGNOSIS — F3170 Bipolar disorder, currently in remission, most recent episode unspecified: Secondary | ICD-10-CM

## 2018-01-23 DIAGNOSIS — S069X3S Unspecified intracranial injury with loss of consciousness of 1 hour to 5 hours 59 minutes, sequela: Secondary | ICD-10-CM

## 2018-01-23 MED ORDER — AMPHETAMINE-DEXTROAMPHETAMINE 10 MG PO TABS: 10.0000 mg | ORAL_TABLET | Freq: Two times a day (BID) | ORAL | 0 refills | Status: DC

## 2018-01-23 MED ORDER — CLONAZEPAM 0.5 MG PO TABS: 0.2500 mg | ORAL_TABLET | Freq: Two times a day (BID) | ORAL | 2 refills | Status: DC

## 2018-01-23 MED ORDER — TRAZODONE HCL 100 MG PO TABS: 100.0000 mg | ORAL_TABLET | Freq: Every day | ORAL | 5 refills | Status: DC

## 2018-01-23 NOTE — Patient Instructions (Signed)
PLEASE FEEL FREE TO CALL OUR OFFICE WITH ANY PROBLEMS OR QUESTIONS ([phone])      

## 2018-01-23 NOTE — Progress Notes (Signed)
Subjective:    Patient ID: Laurie Booker, female    DOB: Feb 02, 1966, 52 y.o.   MRN: [id number]  HPI   Laurie Booker is here in follow up of her TBI. She has been dealing with a lot of psychosocial stresses the last several weeks including her son who developed mono. Additionally there were two deaths among friends of the family. The additional stress makes it really difficult from a cognitive and coping standpoint. The adderall certainly helps her maintain focus. She is using it off and on, typically at times which require her to focus more from a cognitive standpoint. She is using klonopin typically at night but has not started using it during the day  She is helping with her mother's care. She also has a large vegetable garden which she takes pride in.   Pain Inventory Average Pain 4 Pain Right Now 1 My pain is intermittent and aching  In the last 24 hours, has pain interfered with the following? General activity 4 Relation with others 4 Enjoyment of life 4 What TIME of day is your pain at its worst? daytime Sleep (in general) Fair  Pain is worse with: some activites Pain improves with: medication Relief from Meds: 10  Mobility walk without assistance ability to climb steps?  yes do you drive?  yes  Function not employed: date last employed n/a I need assistance with the following:  meal prep and household duties  Neuro/Psych tremor confusion anxiety  Prior Studies Any changes since last visit?  no  Physicians involved in your care Any changes since last visit?  no   No family history on file. Social History   Socioeconomic History  . Marital status: Legally Separated    Spouse name: Not on file  . Number of children: Not on file  . Years of education: Not on file  . Highest education level: Not on file  Occupational History  . Not on file  Social Needs  . Financial resource strain: Not on file  . Food insecurity:    Worry: Not on file    Inability: Not on file    . Transportation needs:    Medical: Not on file    Non-medical: Not on file  Tobacco Use  . Smoking status: Never Smoker  . Smokeless tobacco: Never Used  Substance and Sexual Activity  . Alcohol use: Yes    Comment: socially  . Drug use: Not on file  . Sexual activity: Not on file  Lifestyle  . Physical activity:    Days per week: Not on file    Minutes per session: Not on file  . Stress: Not on file  Relationships  . Social connections:    Talks on phone: Not on file    Gets together: Not on file    Attends religious service: Not on file    Active member of club or organization: Not on file    Attends meetings of clubs or organizations: Not on file    Relationship status: Not on file  Other Topics Concern  . Not on file  Social History Narrative  . Not on file   Past Surgical History:  Procedure Laterality Date  . CESAREAN SECTION    . COLONOSCOPY W/ POLYPECTOMY    . DILATION AND CURETTAGE OF UTERUS    . MICROLARYNGOSCOPY WITH CO2 LASER AND EXCISION OF VOCAL CORD LESION N/A 03/19/2015   Procedure: MICROLARYNGOSCOPY WITH CO2 LASER AND EXCISION OF VOCAL CORD LESION;  Surgeon: Christia Reading,  MD;  Location: [facility] OR;  Service: ENT;  Laterality: N/A;  Micro Direct Laryngoscopy with co2 laser excision of bilateral vocal cord granulomas/jet venturi ventilation  . RHINOPLASTY    . SEPTOPLASTY    . vocal cord surgery     granulomas removed after intubation d/t suicide attempt   Past Medical History:  Diagnosis Date  . Anxiety   . Bipolar disorder (HCC)   . Blood clots in brain    d/t traumatic brain injury Aug 2016  . Chronic kidney disease    follows up with Dr. Lequita HaltLufadeju: nephrologist in [place]. Pt cannot state what is wrong with her kidneys; just that she has issues with them. (numerous renal cysts on 01/2015 CT)  . Depression    behavioral health admission 2009 or 2010  . H/O suicide attempt 2009  . H/O transfusion of packed red blood cells   . Osteoporosis   . Traumatic  brain injury [facility](HCC)    [facility] of [facility], hemorrhage   BP 117/74   Pulse 65   Ht 5\' 2"  (1.575 m)   Wt 101 lb 9.6 oz (46.1 kg)   LMP  (LMP Unknown) Comment: trauma  SpO2 98%   BMI 18.58 kg/m   Opioid Risk Score:   Fall Risk Score:  `1  Depression screen PHQ 2/9  [facility] 01/23/2018 04/10/2017 03/30/2015  Decreased Interest 0 1 1  Down, Depressed, Hopeless 0 1 2  PHQ - 2 Score 0 2 3  Altered sleeping - - 2  Tired, decreased energy - - 2  Change in appetite - - 1  Feeling bad or failure about yourself  - - 2  Trouble concentrating - - 2  Moving slowly or fidgety/restless - - 2  Suicidal thoughts - - 1  PHQ-9 Score - - 15  Difficult doing work/chores - - Very difficult    Review of Systems  Constitutional: Negative.   HENT: Negative.   Eyes: Negative.   Respiratory: Negative.   Cardiovascular: Negative.   Gastrointestinal: Negative.   Endocrine: Negative.   Genitourinary: Positive for dysuria.  Musculoskeletal: Negative.   Skin: Negative.   Allergic/Immunologic: Negative.   Neurological: Negative.   Hematological: Negative.   Psychiatric/Behavioral: Negative.   All other systems reviewed and are negative.      Objective:   Physical Exam  General: No acute distress HEENT: EOMI, oral membranes moist Cards: reg rate  Chest: normal effort Abdomen: Soft, NT, ND Skin: dry, intact Extremities: no edema Skin:Clean and intact  Neuro:Oriented x 3. movess all 4's. Normal sensation. Good insight and awareness. Attention is functional for conversation. Memory intact for recent information Musculoskeletal:normal effort. Psych:pt pleasant, slightly anxious   Assessment & Plan:  1. Traumatic brain injury 01/31/15 after fall --SAH, Right frontal contusion, Right middle cranial fossa EDH  -pt with subsequent cognitive/behavioral deficits post injury  -improved awareness and cognition.  -Attention and  anxiety have been obstacles although the approval of her disability has helped from a emotional and psychosocial standpoint.  2. Post traumatic headaches--improved. Can increase with stress,fatigue  3. Hx of bipolar disorder 4. Neurogenic bladder. resolved 5. BPPV 6. Nephrogenic diabetes insipidus    Plan:  1. Continue with neuropsychology follow up          -continue with coping strategies  2. Lamictal, lithium, for her bipolar disorder.           -Continue xanax at 0.5mg  bid scheduled with a prn  dose if needed.  3. Trazodone 100mg  qhs.Refilled today. 4. Maintain xanax  prn.   -continue klonopin low dose 0.25-0.5mg . I would like her to take it bid for anxiety and perseverative behaviors. 5.Continue with low dose adderall10mg  qd to bid/ bid prn.  6. Gaze stabilization exercisesshall continue, especially if experiences recurrent symptoms 7.   Diabetes insipidus: lithium reduction has helpedwith this -endocrine   Follow up with me in 4 months. Fifteen minutes of face to face patient care time were spent during this visit. All questions were encouraged and answered.      Her psychiatrist in [place]: Raeford Razor, MD, pHD [phone]. She actively remains under his care.

## 2018-01-29 ENCOUNTER — Encounter: Payer: Self-pay | Admitting: Physical Medicine & Rehabilitation

## 2018-03-22 ENCOUNTER — Encounter: Payer: Self-pay | Attending: Physical Medicine & Rehabilitation | Admitting: Psychology

## 2018-03-22 DIAGNOSIS — S069X0S Unspecified intracranial injury without loss of consciousness, sequela: Secondary | ICD-10-CM | POA: Insufficient documentation

## 2018-03-22 DIAGNOSIS — S069X2S Unspecified intracranial injury with loss of consciousness of 31 minutes to 59 minutes, sequela: Secondary | ICD-10-CM | POA: Insufficient documentation

## 2018-03-22 DIAGNOSIS — S069X3S Unspecified intracranial injury with loss of consciousness of 1 hour to 5 hours 59 minutes, sequela: Secondary | ICD-10-CM

## 2018-03-22 DIAGNOSIS — I669 Occlusion and stenosis of unspecified cerebral artery: Secondary | ICD-10-CM | POA: Insufficient documentation

## 2018-03-22 DIAGNOSIS — M810 Age-related osteoporosis without current pathological fracture: Secondary | ICD-10-CM | POA: Insufficient documentation

## 2018-03-22 DIAGNOSIS — G44309 Post-traumatic headache, unspecified, not intractable: Secondary | ICD-10-CM | POA: Insufficient documentation

## 2018-03-22 DIAGNOSIS — F419 Anxiety disorder, unspecified: Secondary | ICD-10-CM | POA: Insufficient documentation

## 2018-03-22 DIAGNOSIS — Z79899 Other long term (current) drug therapy: Secondary | ICD-10-CM | POA: Insufficient documentation

## 2018-03-22 DIAGNOSIS — Z5181 Encounter for therapeutic drug level monitoring: Secondary | ICD-10-CM | POA: Insufficient documentation

## 2018-03-22 DIAGNOSIS — F3161 Bipolar disorder, current episode mixed, mild: Secondary | ICD-10-CM | POA: Insufficient documentation

## 2018-03-22 DIAGNOSIS — N189 Chronic kidney disease, unspecified: Secondary | ICD-10-CM | POA: Insufficient documentation

## 2018-03-22 DIAGNOSIS — S069X9S Unspecified intracranial injury with loss of consciousness of unspecified duration, sequela: Secondary | ICD-10-CM

## 2018-03-22 DIAGNOSIS — G3189 Other specified degenerative diseases of nervous system: Secondary | ICD-10-CM

## 2018-03-22 DIAGNOSIS — F09 Unspecified mental disorder due to known physiological condition: Secondary | ICD-10-CM

## 2018-03-22 DIAGNOSIS — F310 Bipolar disorder, current episode hypomanic: Secondary | ICD-10-CM

## 2018-03-25 ENCOUNTER — Encounter: Payer: Self-pay | Admitting: Psychology

## 2018-03-25 NOTE — Progress Notes (Signed)
Patient:  Laurie Booker   DOB: [birth year]  MR Number: [id number]  Location: [facility] PHYSICAL MEDICINE AND REHABILITATION [address] [place], [address] [id number] [facility] [place] [address] Dept: [phone]  Start: 10 AM End: 11 AM  Provider/Observer:     Hershal Coria PsyD  Chief Complaint:      Chief Complaint  Patient presents with  . Memory Loss  . Other  . Anxiety  . Stress    Reason For Service:     The patient is a 52 year old female who is referred for psychological/neuropsychological interventions. The patient was involved in a fall in 2016. She reports that she had been at a local bar with the friend in a couple of other people. She reports that she remembers having a conversation with one of the people in the bar and has very little to no memory of events after that. She reports that she has been told that she got into an [place] with them and she into other gentleman apparently went to one of the gentleman's house. She reports that this is extremely unusual and out of character for her and that she does not think she would ever go voluntarily with people that she went with. The patient reports that she was told that when they arrived at the house that she fell backwards on the sidewalk and fractured her skull. She reports that she was told that she then tried to get up and fell forward fracturing her eye socket and jaw. The patient reports that she has no recall at all for these events. The patient questions what actually happened. No toxicology study was done. She does acknowledge that she had been consuming alcohol. The patient reports that since his accident that she has had ongoing confusion, executive functioning difficulties, difficulties with attention and concentration, memory difficulties, and a change in her sense of taste or smell. She has taste aversions to foods that she used to like. The patient does have a  history of being diagnosed with bipolar 1 disorder and that she has had increasing anxiety and agitation since this accident.  The above reason for service and history remains accurate for the current appointment.  I have reviewed it for this appointment.  The patient reports that she has made some significant improvements in her functioning but has had a lot of stressors recently.  There have been 3 or 4 major events happen since I saw her last.  Interventions Strategy:  Cognitive/behavioral therapeutic interventions and working on neuropsychological coping and adaptive skills around residual cognitive deficits.  Participation Level:   Active  Participation Quality:  Appropriate and Attentive      Behavioral Observation:  Well Groomed, Alert, and Appropriate.   Current Psychosocial Factors: The patient came in reported that there have been 3 or 4 major events in her family recently.  The first was a situation where her daughter who is a Holiday representative in college and has developed a friendship with a younger sorority sister who did the daughters become a Dance movement psychotherapist for were at a party and the younger sorority sister started feeling poorly and while she was walking outside had a syncopal event falling and striking her head on a wall.  The sorority sister broke her neck and this fall and died.  Later autopsy showed that she had enlarged heart which likely triggered the syncopal event.  While all of this was going on her family plans on coming to  see her son graduate from high school.  This was very stressful for her as well.  The patient's son then developed mononucleosis and severe strep throat that required multiple emergency room and then hospitalizations for severe infection.  The patient did had to go to a funeral in [place] with her daughter.  While all of this was going on the patient's very close friend who lives in [place] husband died and the patient had to travel there for a funeral.  The patient reports  that this was nearly overwhelming to her but she reports that his usual she did well during the acute stress of the events but has had more anxiety including tremors in her hand following this.  Content of Session:   Reviewed current symptoms and continue to work on planning and coping mechanisms to adapt to her residual psychological deficits following her TBI.  Current Status:   The patient reports that she had been doing much better until the significant psychosocial events occurred as described above.  The patient reports that she was proud that she was able to get through all of these major stressors but reports that she is now feeling overwhelmed and has had a lot of anxiety.  We have been working on how to cope with the residual effects of all the stressors on top of her TBI.  Patient Progress:   The patient continues to actively work on improving and building her coping resources to deal with various life stressors and residual cognitive deficits.  Target Goals:   Help develop skills and coping resourses.  Last Reviewed:   11/08/2017  Impression/Diagnosis:   The patient is a 52 year old female who suffered a significant fall in 2016. She fell backwards and fractured her skull and was told that she tried to stand up and then fell forward fracturing her eye socket and jaw. She has had changes in her sense of taste and smell, ongoing attention concentration difficulties, executive functioning difficulties, memory difficulties, and increasing anxiety and worry over her difficulties. The patient reports that she has been trying to do what she can but she cannot work right now. 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 The patient has been struggling with the way others in her family have been handling her difficulties in trying to figure out what exactly is going on to cause all of her cognitive issues. She does have a prior history of bipolar affective disorder but it  appears to be well managed at this time and has been for some time.  The above impression/diagnoses have been reviewed for this visit and do remain appropriate and applicable for the current visit.  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

## 2018-05-27 ENCOUNTER — Other Ambulatory Visit: Payer: Self-pay

## 2018-05-27 ENCOUNTER — Encounter: Payer: Self-pay | Admitting: Physical Medicine & Rehabilitation

## 2018-05-27 ENCOUNTER — Encounter: Payer: Medicare HMO | Attending: Physical Medicine & Rehabilitation | Admitting: Physical Medicine & Rehabilitation

## 2018-05-27 VITALS — BP 115/74 | HR 72 | Ht 62.0 in | Wt 103.0 lb

## 2018-05-27 DIAGNOSIS — S069X9S Unspecified intracranial injury with loss of consciousness of unspecified duration, sequela: Secondary | ICD-10-CM

## 2018-05-27 DIAGNOSIS — Z87820 Personal history of traumatic brain injury: Secondary | ICD-10-CM | POA: Insufficient documentation

## 2018-05-27 DIAGNOSIS — S069XAS Unspecified intracranial injury with loss of consciousness status unknown, sequela: Secondary | ICD-10-CM

## 2018-05-27 DIAGNOSIS — F0781 Postconcussional syndrome: Secondary | ICD-10-CM | POA: Diagnosis not present

## 2018-05-27 DIAGNOSIS — Z760 Encounter for issue of repeat prescription: Secondary | ICD-10-CM | POA: Insufficient documentation

## 2018-05-27 DIAGNOSIS — Z79899 Other long term (current) drug therapy: Secondary | ICD-10-CM | POA: Insufficient documentation

## 2018-05-27 DIAGNOSIS — R251 Tremor, unspecified: Secondary | ICD-10-CM | POA: Insufficient documentation

## 2018-05-27 DIAGNOSIS — F419 Anxiety disorder, unspecified: Secondary | ICD-10-CM | POA: Insufficient documentation

## 2018-05-27 DIAGNOSIS — N189 Chronic kidney disease, unspecified: Secondary | ICD-10-CM | POA: Insufficient documentation

## 2018-05-27 DIAGNOSIS — G44309 Post-traumatic headache, unspecified, not intractable: Secondary | ICD-10-CM | POA: Diagnosis not present

## 2018-05-27 DIAGNOSIS — H8110 Benign paroxysmal vertigo, unspecified ear: Secondary | ICD-10-CM | POA: Diagnosis not present

## 2018-05-27 DIAGNOSIS — F09 Unspecified mental disorder due to known physiological condition: Secondary | ICD-10-CM | POA: Diagnosis not present

## 2018-05-27 DIAGNOSIS — G3189 Other specified degenerative diseases of nervous system: Secondary | ICD-10-CM

## 2018-05-27 DIAGNOSIS — F319 Bipolar disorder, unspecified: Secondary | ICD-10-CM | POA: Insufficient documentation

## 2018-05-27 DIAGNOSIS — N251 Nephrogenic diabetes insipidus: Secondary | ICD-10-CM | POA: Diagnosis not present

## 2018-05-27 DIAGNOSIS — S069X0S Unspecified intracranial injury without loss of consciousness, sequela: Secondary | ICD-10-CM | POA: Diagnosis not present

## 2018-05-27 NOTE — Patient Instructions (Signed)
PLEASE FEEL FREE TO CALL OUR OFFICE WITH ANY PROBLEMS OR QUESTIONS ([phone])      

## 2018-05-27 NOTE — Progress Notes (Signed)
Subjective:    Patient ID: Laurie Booker, female    DOB: 18-Mar-1966, 52 y.o.   MRN: [id number]  HPI   Laurie Booker is here in follow up of her TBI/post-concussive syndrome. She has been doing really well from a cognitive and mood standpoint. She is using adderall only when she needs it for focus. She seems to be handling family and psychosocial issues with more confidence and ease. She is off klonopin and only really uses xanax at night. She has been able to manage her mood more effectively on her own and through skills learned with Dr. Kieth Brightly  She has noticed a small tremor in the left hand which can come and go. It doesn't seem to impact every day living tasks.   Sleep is fairly regular. Balance has been good   Pain Inventory Average Pain 4 Pain Right Now 0 My pain is dull  In the last 24 hours, has pain interfered with the following? General activity 2 Relation with others 2 Enjoyment of life 2 What TIME of day is your pain at its worst? night Sleep (in general) Fair  Pain is worse with: some activites Pain improves with: medication Relief from Meds: 4  Mobility walk without assistance ability to climb steps?  yes do you drive?  yes  Function disabled: date disabled Sept 2019 I need assistance with the following:  household duties and shopping  Neuro/Psych tremor depression anxiety  Prior Studies Any changes since last visit?  no  Physicians involved in your care Any changes since last visit?  no   No family history on file. Social History   Socioeconomic History  . Marital status: Legally Separated    Spouse name: Not on file  . Number of children: Not on file  . Years of education: Not on file  . Highest education level: Not on file  Occupational History  . Not on file  Social Needs  . Financial resource strain: Not on file  . Food insecurity:    Worry: Not on file    Inability: Not on file  . Transportation needs:    Medical: Not on file   Non-medical: Not on file  Tobacco Use  . Smoking status: Never Smoker  . Smokeless tobacco: Never Used  Substance and Sexual Activity  . Alcohol use: Yes    Comment: socially  . Drug use: Not on file  . Sexual activity: Not on file  Lifestyle  . Physical activity:    Days per week: Not on file    Minutes per session: Not on file  . Stress: Not on file  Relationships  . Social connections:    Talks on phone: Not on file    Gets together: Not on file    Attends religious service: Not on file    Active member of club or organization: Not on file    Attends meetings of clubs or organizations: Not on file    Relationship status: Not on file  Other Topics Concern  . Not on file  Social History Narrative  . Not on file   Past Surgical History:  Procedure Laterality Date  . CESAREAN SECTION    . COLONOSCOPY W/ POLYPECTOMY    . DILATION AND CURETTAGE OF UTERUS    . MICROLARYNGOSCOPY WITH CO2 LASER AND EXCISION OF VOCAL CORD LESION N/A 03/19/2015   Procedure: MICROLARYNGOSCOPY WITH CO2 LASER AND EXCISION OF VOCAL CORD LESION;  Surgeon: Christia Reading, MD;  Location: [facility] OR;  Service: ENT;  Laterality: N/A;  Micro Direct Laryngoscopy with co2 laser excision of bilateral vocal cord granulomas/jet venturi ventilation  . RHINOPLASTY    . SEPTOPLASTY    . vocal cord surgery     granulomas removed after intubation d/t suicide attempt   Past Medical History:  Diagnosis Date  . Anxiety   . Bipolar disorder (HCC)   . Blood clots in brain    d/t traumatic brain injury Aug 2016  . Chronic kidney disease    follows up with Dr. Lequita Halt: nephrologist in [place]. Pt cannot state what is wrong with her kidneys; just that she has issues with them. (numerous renal cysts on 01/2015 CT)  . Depression    behavioral health admission 2009 or 2010  . H/O suicide attempt 2009  . H/O transfusion of packed red blood cells   . Osteoporosis   . Traumatic brain injury [facility])    with loss of consciousness,  intubated, at [facility] August 2016, closed, hemorrhage   BP 115/74   Pulse 72   Ht 5\' 2"  (1.575 m)   Wt 103 lb (46.7 kg)   LMP  (LMP Unknown) Comment: trauma  SpO2 99%   BMI 18.84 kg/m   Opioid Risk Score:   Fall Risk Score:  `1  Depression screen PHQ 2/9  Depression screen [facility] 2/9 05/27/2018 01/23/2018 04/10/2017 03/30/2015  Decreased Interest 1 0 1 1  Down, Depressed, Hopeless 1 0 1 2  PHQ - 2 Score 2 0 2 3  Altered sleeping - - - 2  Tired, decreased energy - - - 2  Change in appetite - - - 1  Feeling bad or failure about yourself  - - - 2  Trouble concentrating - - - 2  Moving slowly or fidgety/restless - - - 2  Suicidal thoughts - - - 1  PHQ-9 Score - - - 15  Difficult doing work/chores - - - Very difficult    Review of Systems  Constitutional: Negative.   HENT: Negative.   Eyes: Negative.   Respiratory: Negative.   Cardiovascular: Negative.   Gastrointestinal: Negative.   Endocrine: Negative.   Genitourinary: Negative.   Musculoskeletal: Negative.   Skin: Negative.   Allergic/Immunologic: Negative.   Neurological: Negative.   Hematological: Negative.   Psychiatric/Behavioral: Negative.   All other systems reviewed and are negative.      Objective:   Physical Exam General: No acute distress HEENT: EOMI, oral membranes moist Cards: reg rate  Chest: normal effort Abdomen: Soft, NT, ND Skin: dry, intact Extremities: no edema, no tremor Neuro:Oriented x 3. movess all 4's. Normal sensation. Good insight and awareness. Attention is functional for conversation. Memory intact.  Musculoskeletal:normal effort. Psych:Pleasant as always  Assessment & Plan:  1. Traumatic brain injury 01/31/15 after fall --SAH, Right frontal contusion, Right middle cranial fossa EDH  -pt with subsequent cognitive/behavioral deficits post injury  -improvedawareness and cognition.  -attention and anxiety are more manageable 2. Post traumatic headaches--improved.Can increase with  stress,fatigue 3. Hx of bipolar disorder 4. Neurogenic bladder. resolved 5. BPPV 6. Nephrogenic diabetes insipidus    Plan:  1. Continue with neuropsychology follow up for coping strategies  2. Lamictal, lithium, for her bipolar disorder.   -Continue xanax at 0.5mg  qhs with a prn dose if needed.     -off klonopin 3. Trazodone 100mg  qhs.Refilled today. 4. observe tremor for now 5.Continue with low dose adderall10mg  qd prn to bid prn for focus when needed.   -she will call for RF 6. Gaze stabilization  exercisesas maintenance for vestibular sx 7.   Diabetes insipidus: lithium reduction has helpedwith this -endocrine   Follow up with me in 6 months. Fifteen minutes of face to face patient care time were spent during this visit. All questions were encouraged and answered.      [place] in [place]: Raeford RazorLuis Giuffra, MD, pHD [phone]. She actively remains under his care.

## 2018-05-30 ENCOUNTER — Encounter: Payer: Medicare HMO | Attending: Psychology | Admitting: Psychology

## 2018-05-30 DIAGNOSIS — F310 Bipolar disorder, current episode hypomanic: Secondary | ICD-10-CM | POA: Insufficient documentation

## 2018-05-30 DIAGNOSIS — R413 Other amnesia: Secondary | ICD-10-CM | POA: Insufficient documentation

## 2018-05-30 DIAGNOSIS — S069X0S Unspecified intracranial injury without loss of consciousness, sequela: Secondary | ICD-10-CM

## 2018-05-30 DIAGNOSIS — F09 Unspecified mental disorder due to known physiological condition: Secondary | ICD-10-CM | POA: Diagnosis not present

## 2018-05-30 DIAGNOSIS — F439 Reaction to severe stress, unspecified: Secondary | ICD-10-CM | POA: Insufficient documentation

## 2018-05-30 DIAGNOSIS — S069X9S Unspecified intracranial injury with loss of consciousness of unspecified duration, sequela: Secondary | ICD-10-CM | POA: Diagnosis not present

## 2018-05-30 DIAGNOSIS — F419 Anxiety disorder, unspecified: Secondary | ICD-10-CM | POA: Insufficient documentation

## 2018-05-30 DIAGNOSIS — G3189 Other specified degenerative diseases of nervous system: Secondary | ICD-10-CM | POA: Diagnosis not present

## 2018-06-05 ENCOUNTER — Encounter: Payer: Self-pay | Admitting: Psychology

## 2018-06-05 NOTE — Progress Notes (Signed)
Patient:  Laurie Booker   DOB: 06-12-1966  MR Number: [id number]  Location: [facility] FOR PAIN AND REHABILITATIVE MEDICINE [facility] PHYSICAL MEDICINE AND REHABILITATION [address] [place], [address] [id number] [facility] [place] [address] Dept: [phone]  Start: 9 AM End: 10 AM  Provider/Observer:     Hershal Coria PsyD  Chief Complaint:      Chief Complaint  Patient presents with  . Memory Loss  . Anxiety  . Stress    Reason For Service:     The patient is a 52 year old female who is referred for psychological/neuropsychological interventions. The patient was involved in a fall in 2016. She reports that she had been at a local bar with the friend in a couple of other people. She reports that she remembers having a conversation with one of the people in the bar and has very little to no memory of events after that. She reports that she has been told that she got into an [place] with them and she into other gentleman apparently went to one of the gentleman's house. She reports that this is extremely unusual and out of character for her and that she does not think she would ever go voluntarily with people that she went with. The patient reports that she was told that when they arrived at the house that she fell backwards on the sidewalk and fractured her skull. She reports that she was told that she then tried to get up and fell forward fracturing her eye socket and jaw. The patient reports that she has no recall at all for these events. The patient questions what actually happened. No toxicology study was done. She does acknowledge that she had been consuming alcohol. The patient reports that since his accident that she has had ongoing confusion, executive functioning difficulties, difficulties with attention and concentration, memory difficulties, and a change in her sense of taste or smell. She has taste aversions to foods that she used to like. The patient does have a history of  being diagnosed with bipolar 1 disorder and that she has had increasing anxiety and agitation since this accident.  The above reason for service and history remain accurate for the current appointment.  I have reviewed this prior to the appointment.  The patient reports that she has made some ongoing improvements in her functioning.  However, there is been some stressors recently about issues between her and her ex-husband on a financial level.  The patient feels like her ex-husband created significant issues with the way handle financial issues that deviated significantly from the divorce agreements.  The patient has been working with an attorney to try to address these issues.  She reports that now that she feels like she is got a better handle on what happens and has been able to address this with her attorney her stress around these issues have been significantly improving.  Interventions Strategy:  Cognitive/behavioral psychotherapeutic interventions and working on neuropsychological coping and adaptive skills around residual cognitive deficits. Participation Level:   Active  Participation Quality:  Appropriate and Attentive      Behavioral Observation:  Well Groomed, Alert, and Appropriate.   Current Psychosocial Factors: The patient reports that overall psychosocial stressors have been improving significantly for the most part.  However, recent issues about money being taken out of an account for a property she and her ex-husband continue to own but have on the market for sale as part of the divorce several years ago have added  to her recent stress.   Content of Session:   Reviewed current symptoms and continue to work on planning and coping mechanisms to adapt to her residual psychological deficits following her TBI.  Current Status:   The patient reports that she had been doing much better until the significant psychosocial events occurred as described above.  The patient reports that she  was proud that she was able to get through all of these major stressors but reports that she is now feeling overwhelmed and has had a lot of anxiety.  We have been working on how to cope with the residual effects of all the stressors on top of her TBI.  Patient Progress:   The patient continues to actively work on improving and building her coping resources to deal with various life stressors and residual cognitive deficits.  Target Goals:   Help develop skills and coping resourses.  Last Reviewed:   05/30/2018  Impression/Diagnosis:   The patient is a 52 year old female who suffered a significant fall in 2016. She fell backwards and fractured her skull and was told that she tried to stand up and then fell forward fracturing her eye socket and jaw. She has had changes in her sense of taste and smell, ongoing attention concentration difficulties, executive functioning difficulties, memory difficulties, and increasing anxiety and worry over her difficulties. The patient reports that she has been trying to do what she can but she cannot work right now. 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 The patient has been struggling with the way others in her family have been handling her difficulties in trying to figure out what exactly is going on to cause all of her cognitive issues. She does have a prior history of bipolar affective disorder but it appears to be well managed at this time and has been for some time.  The above impression/diagnoses have been reviewed for this visit and do remain appropriate and applicable for the current visit.  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

## 2018-07-09 ENCOUNTER — Ambulatory Visit: Payer: Self-pay | Admitting: Psychology

## 2018-07-18 ENCOUNTER — Encounter: Payer: Medicare HMO | Admitting: Psychology

## 2018-08-22 ENCOUNTER — Encounter: Payer: Medicare HMO | Attending: Psychology | Admitting: Psychology

## 2018-08-22 DIAGNOSIS — S069X0S Unspecified intracranial injury without loss of consciousness, sequela: Secondary | ICD-10-CM

## 2018-08-22 DIAGNOSIS — G3189 Other specified degenerative diseases of nervous system: Secondary | ICD-10-CM

## 2018-08-22 DIAGNOSIS — F419 Anxiety disorder, unspecified: Secondary | ICD-10-CM | POA: Insufficient documentation

## 2018-08-22 DIAGNOSIS — F09 Unspecified mental disorder due to known physiological condition: Secondary | ICD-10-CM | POA: Diagnosis not present

## 2018-08-22 DIAGNOSIS — F310 Bipolar disorder, current episode hypomanic: Secondary | ICD-10-CM | POA: Insufficient documentation

## 2018-08-22 DIAGNOSIS — F3170 Bipolar disorder, currently in remission, most recent episode unspecified: Secondary | ICD-10-CM

## 2018-08-22 DIAGNOSIS — R413 Other amnesia: Secondary | ICD-10-CM | POA: Diagnosis not present

## 2018-08-22 DIAGNOSIS — S069X9S Unspecified intracranial injury with loss of consciousness of unspecified duration, sequela: Secondary | ICD-10-CM | POA: Diagnosis present

## 2018-08-22 DIAGNOSIS — G44309 Post-traumatic headache, unspecified, not intractable: Secondary | ICD-10-CM | POA: Diagnosis not present

## 2018-08-22 DIAGNOSIS — F439 Reaction to severe stress, unspecified: Secondary | ICD-10-CM | POA: Insufficient documentation

## 2018-08-25 ENCOUNTER — Encounter: Payer: Self-pay | Admitting: Psychology

## 2018-08-25 NOTE — Progress Notes (Signed)
Patient:  Malieka Holness   DOB: 05-19-1966  MR Number: [id number]  Location: [facility] FOR PAIN AND REHABILITATIVE MEDICINE [facility] PHYSICAL MEDICINE AND REHABILITATION [address] [place], [address] [id number] [facility] [place] [address] Dept: [phone]  Start: 9 AM End: 10 AM  Provider/Observer:     Hershal Coria PsyD  Chief Complaint:      Chief Complaint  Patient presents with  . Memory Loss  . Pain  . Headache  . Dizziness    Reason For Service:     The patient is a 53 year old female who is referred for psychological/neuropsychological interventions. The patient was involved in a fall in 2016. She reports that she had been at a local bar with the friend in a couple of other people. She reports that she remembers having a conversation with one of the people in the bar and has very little to no memory of events after that. She reports that she has been told that she got into an [place] with them and she into other gentleman apparently went to one of the gentleman's house. She reports that this is extremely unusual and out of character for her and that she does not think she would ever go voluntarily with people that she went with. The patient reports that she was told that when they arrived at the house that she fell backwards on the sidewalk and fractured her skull. She reports that she was told that she then tried to get up and fell forward fracturing her eye socket and jaw. The patient reports that she has no recall at all for these events. The patient questions what actually happened. No toxicology study was done. She does acknowledge that she had been consuming alcohol. The patient reports that since his accident that she has had ongoing confusion, executive functioning difficulties, difficulties with attention and concentration, memory difficulties, and a change in her sense of taste or smell. She has taste aversions to foods that she used to like. The patient does have a  history of being diagnosed with bipolar 1 disorder and that she has had increasing anxiety and agitation since this accident.  The above reason for service and history remain accurate for the current appointment.  I have reviewed this prior to the appointment.  The patient reports that she has made some ongoing improvements in her functioning.  However, there is been some stressors recently about issues between her and her ex-husband on a financial level.  The patient feels like her ex-husband created significant issues with the way handle financial issues that deviated significantly from the divorce agreements.  The patient has been working with an attorney to try to address these issues.  She reports that now that she feels like she is got a better handle on what happens and has been able to address this with her attorney her stress around these issues have been significantly improving.  Interventions Strategy:  Cognitive/behavioral psychotherapeutic interventions and working on neuropsychological coping and adaptive skills around residual cognitive deficits. Participation Level:   Active  Participation Quality:  Appropriate and Attentive      Behavioral Observation:  Well Groomed, Alert, and Appropriate.   Current Psychosocial Factors: The patient reports that overall psychosocial stressors have been improving significantly for the most part.  However, recent issues about money being taken out of an account for a property she and her ex-husband continue to own but have on the market for sale as part of the divorce several years  ago have added to her recent stress.   Content of Session:   Reviewed current symptoms and continue to work on planning and coping mechanisms to adapt to her residual psychological deficits following her TBI.  Current Status:   The patient reports that she had been doing much better until the significant psychosocial events occurred as described above.  The patient reports  that she was proud that she was able to get through all of these major stressors but reports that she is now feeling overwhelmed and has had a lot of anxiety.  We have been working on how to cope with the residual effects of all the stressors on top of her TBI.  Patient Progress:   The patient continues to actively work on improving and building her coping resources to deal with various life stressors and residual cognitive deficits.  Target Goals:   Help develop skills and coping resourses.  Last Reviewed:   08/14/2018  Impression/Diagnosis:   The patient is a 53 year old female who suffered a significant fall in 2016. She fell backwards and fractured her skull and was told that she tried to stand up and then fell forward fracturing her eye socket and jaw. She has had changes in her sense of taste and smell, ongoing attention concentration difficulties, executive functioning difficulties, memory difficulties, and increasing anxiety and worry over her difficulties. The patient reports that she has been trying to do what she can but she cannot work right now. 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 The patient has been struggling with the way others in her family have been handling her difficulties in trying to figure out what exactly is going on to cause all of her cognitive issues. She does have a prior history of bipolar affective disorder but it appears to be well managed at this time and has been for some time.  The above impression/diagnoses have been reviewed for this visit and do remain appropriate and applicable for the current visit.  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

## 2018-09-19 ENCOUNTER — Encounter: Payer: Medicare HMO | Attending: Psychology | Admitting: Psychology

## 2018-09-19 ENCOUNTER — Other Ambulatory Visit: Payer: Self-pay

## 2018-09-19 ENCOUNTER — Encounter: Payer: Self-pay | Admitting: Psychology

## 2018-09-19 DIAGNOSIS — F310 Bipolar disorder, current episode hypomanic: Secondary | ICD-10-CM | POA: Diagnosis not present

## 2018-09-19 DIAGNOSIS — F419 Anxiety disorder, unspecified: Secondary | ICD-10-CM | POA: Diagnosis not present

## 2018-09-19 DIAGNOSIS — G44309 Post-traumatic headache, unspecified, not intractable: Secondary | ICD-10-CM

## 2018-09-19 DIAGNOSIS — S069X0S Unspecified intracranial injury without loss of consciousness, sequela: Secondary | ICD-10-CM

## 2018-09-19 DIAGNOSIS — R413 Other amnesia: Secondary | ICD-10-CM | POA: Insufficient documentation

## 2018-09-19 DIAGNOSIS — F09 Unspecified mental disorder due to known physiological condition: Secondary | ICD-10-CM

## 2018-09-19 DIAGNOSIS — G3189 Other specified degenerative diseases of nervous system: Secondary | ICD-10-CM

## 2018-09-19 DIAGNOSIS — S069XAS Unspecified intracranial injury with loss of consciousness status unknown, sequela: Secondary | ICD-10-CM

## 2018-09-19 DIAGNOSIS — F439 Reaction to severe stress, unspecified: Secondary | ICD-10-CM | POA: Insufficient documentation

## 2018-09-19 DIAGNOSIS — S069X9S Unspecified intracranial injury with loss of consciousness of unspecified duration, sequela: Secondary | ICD-10-CM | POA: Insufficient documentation

## 2018-09-19 NOTE — Progress Notes (Signed)
Patient:  Laurie Booker   DOB: [birth year]  MR Number: [id number]  Location: [facility] FOR PAIN AND REHABILITATIVE MEDICINE [facility] PHYSICAL MEDICINE AND REHABILITATION [address] [place], [address] [id number] [facility] [place] [address] Dept: [phone]  Start: 10 AM End: 11 AM  Provider/Observer:     Hershal Coria PsyD  Chief Complaint:      Chief Complaint  Patient presents with  . Anxiety  . Memory Loss  . Pain    Reason For Service:     The patient is a 53 year old female who is referred for psychological/neuropsychological interventions. The patient was involved in a fall in 2016. She reports that she had been at a local bar with the friend in a couple of other people. She reports that she remembers having a conversation with one of the people in the bar and has very little to no memory of events after that. She reports that she has been told that she got into an [place] with them and she into other gentleman apparently went to one of the gentleman's house. She reports that this is extremely unusual and out of character for her and that she does not think she would ever go voluntarily with people that she went with. The patient reports that she was told that when they arrived at the house that she fell backwards on the sidewalk and fractured her skull. She reports that she was told that she then tried to get up and fell forward fracturing her eye socket and jaw. The patient reports that she has no recall at all for these events. The patient questions what actually happened. No toxicology study was done. She does acknowledge that she had been consuming alcohol. The patient reports that since his accident that she has had ongoing confusion, executive functioning difficulties, difficulties with attention and concentration, memory difficulties, and a change in her sense of taste or smell. She has taste aversions to foods that she used to like. The patient does have a history of  being diagnosed with bipolar 1 disorder and that she has had increasing anxiety and agitation since this accident.    The above reason for service and history remain accurate for the current appointment.  I again have reviewed this prior to the appointment and it remains applicable.  The patient reports that she has had a significant increase in her stress lately primarily due to both of her children going back to live at her house after their universities closed and are doing online classes.  The patient reports that her daughter in particular has been very active and trying to get things done and the commotion around this sudden change in living situation has created an exacerbation of her anxiety and stress levels which prior to this had been showing some significant improvements.    Interventions Strategy:  Cognitive/behavioral psychotherapeutic interventions and working on neuropsychological coping and adaptive skills around residual cognitive deficits.  Participation Level:   Active  Participation Quality:  Appropriate and Attentive      Behavioral Observation:  Well Groomed, Alert, and Appropriate.   Current Psychosocial Factors: The patient reports that she has been doing quite well recently with regard to her anxiety and coping issues.  She has been actively working on the therapeutic interventions and our goals that we have been working on.  However, there is been an acute exacerbation in her anxiety triggered by both of her children coming back from [place] at the same time due to  their universities being close down.  In particular, the patient's daughter who is very bright and energetic also has a lot of compulsive and OCD types of symptoms.  The patient reports that this adds to her stress and that she has been experiencing a worsening of her anxiety and stress symptoms recently.   Content of Session:   Reviewed current symptoms and continue to work on therapeutic interventions  including planning and coping mechanisms to adapt to her residual impacts of her traumatic brain injury.  Current Status:   The patient reports that she had been doing much better with regard to her anxiety and coping until very recently when her children returned from [place].   Patient Progress:   The patient continues to actively work on improving and building her coping resources to deal with various life stressors and residual cognitive deficits.  Target Goals:   Continue to develop coping skills and adaptive abilities around her TBI.  Last Reviewed:   09/18/2028  Impression/Diagnosis:   The patient is a 53 year old female who suffered a significant fall in 2016. She fell backwards and fractured her skull and was told that she tried to stand up and then fell forward fracturing her eye socket and jaw. She has had changes in her sense of taste and smell, ongoing attention concentration difficulties, executive functioning difficulties, memory difficulties, and increasing anxiety and worry over her difficulties. The patient reports that she has been trying to do what she can but she cannot work right now. 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 The patient has been struggling with the way others in her family have been handling her difficulties in trying to figure out what exactly is going on to cause all of her cognitive issues. She does have a prior history of bipolar affective disorder but it appears to be well managed at this time and has been for some time.  The above impression/diagnoses have been reviewed for this visit and do remain appropriate and applicable for the current visit.  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

## 2018-10-14 ENCOUNTER — Other Ambulatory Visit: Payer: Self-pay | Admitting: Physical Medicine & Rehabilitation

## 2018-10-14 DIAGNOSIS — G44309 Post-traumatic headache, unspecified, not intractable: Secondary | ICD-10-CM

## 2018-10-14 DIAGNOSIS — S069X3S Unspecified intracranial injury with loss of consciousness of 1 hour to 5 hours 59 minutes, sequela: Secondary | ICD-10-CM

## 2018-10-14 DIAGNOSIS — S069X0S Unspecified intracranial injury without loss of consciousness, sequela: Secondary | ICD-10-CM

## 2018-10-14 DIAGNOSIS — F3170 Bipolar disorder, currently in remission, most recent episode unspecified: Secondary | ICD-10-CM

## 2018-10-18 ENCOUNTER — Other Ambulatory Visit: Payer: Self-pay | Admitting: Physical Medicine & Rehabilitation

## 2018-10-18 DIAGNOSIS — S069X0S Unspecified intracranial injury without loss of consciousness, sequela: Secondary | ICD-10-CM

## 2018-10-18 DIAGNOSIS — F3170 Bipolar disorder, currently in remission, most recent episode unspecified: Secondary | ICD-10-CM

## 2018-10-18 DIAGNOSIS — G44309 Post-traumatic headache, unspecified, not intractable: Secondary | ICD-10-CM

## 2018-10-18 DIAGNOSIS — S069X3S Unspecified intracranial injury with loss of consciousness of 1 hour to 5 hours 59 minutes, sequela: Secondary | ICD-10-CM

## 2018-11-15 ENCOUNTER — Encounter: Payer: Medicare HMO | Admitting: Psychology

## 2018-11-26 ENCOUNTER — Encounter: Payer: Medicare HMO | Admitting: Physical Medicine & Rehabilitation

## 2018-12-12 ENCOUNTER — Ambulatory Visit: Payer: Medicare HMO | Admitting: Psychology

## 2019-01-02 ENCOUNTER — Other Ambulatory Visit: Payer: Self-pay

## 2019-01-02 ENCOUNTER — Encounter: Payer: Medicare HMO | Attending: Psychology | Admitting: Psychology

## 2019-01-02 DIAGNOSIS — S069X0S Unspecified intracranial injury without loss of consciousness, sequela: Secondary | ICD-10-CM | POA: Diagnosis present

## 2019-01-02 DIAGNOSIS — F09 Unspecified mental disorder due to known physiological condition: Secondary | ICD-10-CM | POA: Insufficient documentation

## 2019-01-02 DIAGNOSIS — G3189 Other specified degenerative diseases of nervous system: Secondary | ICD-10-CM | POA: Diagnosis present

## 2019-01-02 DIAGNOSIS — S069X9S Unspecified intracranial injury with loss of consciousness of unspecified duration, sequela: Secondary | ICD-10-CM | POA: Diagnosis present

## 2019-01-02 DIAGNOSIS — F310 Bipolar disorder, current episode hypomanic: Secondary | ICD-10-CM | POA: Diagnosis not present

## 2019-01-02 DIAGNOSIS — G44309 Post-traumatic headache, unspecified, not intractable: Secondary | ICD-10-CM | POA: Insufficient documentation

## 2019-01-02 DIAGNOSIS — F3170 Bipolar disorder, currently in remission, most recent episode unspecified: Secondary | ICD-10-CM

## 2019-01-12 ENCOUNTER — Encounter: Payer: Self-pay | Admitting: Psychology

## 2019-01-12 NOTE — Progress Notes (Signed)
Patient:  Governor SpeckingMary Haught   DOB: 07/15/1965  MR Number: [id number]  [facility]ITATION [address], [address] [id number] [facility] [place] [id number] Dept: [phone]  Start: 11 AM End: 12 PM  Provider/Observer:     Hershal CoriaJohn R Rayetta Veith PsyD  Chief Complaint:      Chief Complaint  Patient presents with  . Anxiety  . Memory Loss  . Pain    Reason For Service:     The patient is a 53 year old female who is referred for psychological/neuropsychological interventions. The patient was involved in a fall in 2016. She reports that she had been at a local bar with the friend in a couple of other people. She reports that she remembers having a conversation with one of the people in the bar and has very little to no memory of events after that. She reports that she has been told that she got into an [place] with them and she into other gentleman apparently went to one of the gentleman's house. She reports that this is extremely unusual and out of character for her and that she does not think she would ever go voluntarily with people that she went with. The patient reports that she was told that when they arrived at the house that she fell backwards on the sidewalk and fractured her skull. She reports that she was told that she then tried to get up and fell forward fracturing her eye socket and jaw. The patient reports that she has no recall at all for these events. The patient questions what actually happened. No toxicology study was done. She does acknowledge that she had been consuming alcohol. The patient reports that since his accident that she has had ongoing confusion, executive functioning difficulties, difficulties with attention and concentration, memory difficulties, and a change in her sense of taste or smell. She has taste aversions to foods that she used to like. The patient does have a history of  being diagnosed with bipolar 1 disorder and that she has had increasing anxiety and agitation since this accident.  The above reason for service and history remain accurate and appropriate for the current appointment.  I have reviewed this prior to the appointment remains applicable.  The patient reports that she has been managing her stress better recently although she is in the middle of arbitration with her ex-husband regarding [place] property they are in the process of selling and getting a proper financial accounting of the banking history.  Interventions Strategy:  Cognitive/behavioral psychotherapeutic interventions and working on neuropsychological coping and adaptive skills around residual cognitive deficits.  Participation Level:   Active  Participation Quality:  Appropriate and Attentive      Behavioral Observation:  Well Groomed, Alert, and Appropriate.   Current Psychosocial Factors: The patient reports that she is doing much better with the increased activity at home with her children due to COVID-19 restrictions and changes in academic issues.  The patient reports that she does have significant stress with the arbitration going on between her and her ex-husband regarding finalization of their financial disputes..   Content of Session:   Reviewed current symptoms and continue to work on therapeutic interventions including planning and coping mechanisms to adapt to her residual impacts of her traumatic brain injury.  Current Status:   The patient reports that she had been doing much better with regard to her anxiety and coping until very recently  when her children returned from [place].   Patient Progress:   The patient continues to actively work on improving and building her coping resources to deal with various life stressors and residual cognitive deficits.  Target Goals:   Continue to develop coping skills and adaptive abilities around her TBI.  Last  Reviewed:   09/18/2028  Impression/Diagnosis:   The patient is a 53 year old female who suffered a significant fall in 2016. She fell backwards and fractured her skull and was told that she tried to stand up and then fell forward fracturing her eye socket and jaw. She has had changes in her sense of taste and smell, ongoing attention concentration difficulties, executive functioning difficulties, memory difficulties, and increasing anxiety and worry over her difficulties. The patient reports that she has been trying to do what she can but she cannot work right now. 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 The patient has been struggling with the way others in her family have been handling her difficulties in trying to figure out what exactly is going on to cause all of her cognitive issues. She does have a prior history of bipolar affective disorder but it appears to be well managed at this time and has been for some time.  The above impression/diagnoses have been reviewed for this visit and do remain appropriate and applicable for the current visit.  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

## 2019-01-22 ENCOUNTER — Encounter: Payer: Self-pay | Admitting: Physical Medicine & Rehabilitation

## 2019-01-22 ENCOUNTER — Encounter: Payer: Medicare HMO | Admitting: Physical Medicine & Rehabilitation

## 2019-01-22 ENCOUNTER — Other Ambulatory Visit: Payer: Self-pay

## 2019-01-22 VITALS — BP 100/67 | HR 75 | Temp 98.4°F | Ht 62.0 in | Wt 104.0 lb

## 2019-01-22 DIAGNOSIS — S069X9S Unspecified intracranial injury with loss of consciousness of unspecified duration, sequela: Secondary | ICD-10-CM

## 2019-01-22 DIAGNOSIS — F310 Bipolar disorder, current episode hypomanic: Secondary | ICD-10-CM | POA: Diagnosis not present

## 2019-01-22 DIAGNOSIS — G44309 Post-traumatic headache, unspecified, not intractable: Secondary | ICD-10-CM | POA: Diagnosis not present

## 2019-01-22 DIAGNOSIS — S069X0S Unspecified intracranial injury without loss of consciousness, sequela: Secondary | ICD-10-CM | POA: Diagnosis not present

## 2019-01-22 MED ORDER — ALPRAZOLAM 0.5 MG PO TABS: ORAL_TABLET | ORAL | 2 refills | Status: AC

## 2019-01-22 NOTE — Patient Instructions (Signed)
PLEASE FEEL FREE TO CALL OUR OFFICE WITH ANY PROBLEMS OR QUESTIONS ([phone])    FIND TIME FOR LEISURE/PLEASURE/EXERCISE IN YOUR LIFE!!!!

## 2019-01-22 NOTE — Progress Notes (Signed)
Subjective:    Patient ID: Laurie Booker, female    DOB: 08/30/1965, 53 y.o.   MRN: [id number]  HPI   Laurie Booker is here in follow up of her TBI and post-concussion syndrome. She states that things have been fairly well over the last few months. She is still going a lot of psychosocial dynamics which tend to be an ongoing source of stress including financial issues with her ex and the passing of her mother. She has maintained her follow up with Dr. Kieth Brightlyodenbough which she finds helpful from a coping standpoint. Laurie Booker feels that she has found a balance between accepting her deficits and moving forward with her life/trying to push herself. She uses klonopin and xanax at night to help relax and to sleep.   She stopped the adderall as it was causing her to feel a little mittery.     Pain Inventory Average Pain 3 Pain Right Now 1 My pain is dull and aching  In the last 24 hours, has pain interfered with the following? General activity 1 Relation with others 1 Enjoyment of life 1 What TIME of day is your pain at its worst? varies Sleep (in general) Fair  Pain is worse with: none Pain improves with: medication Relief from Meds: 7  Mobility walk without assistance ability to climb steps?  yes do you drive?  yes  Function not employed: date last employed 01-30-15 disabled: date disabled na I need assistance with the following:  meal prep, household duties and shopping  Neuro/Psych tremor confusion depression anxiety  Prior Studies Any changes since last visit?  no bone scan  Physicians involved in your care Any changes since last visit?  no   No family history on file. Social History   Socioeconomic History  . Marital status: Legally Separated    Spouse name: Not on file  . Number of children: Not on file  . Years of education: Not on file  . Highest education level: Not on file  Occupational History  . Not on file  Social Needs  . Financial resource strain: Not on file  .  Food insecurity    Worry: Not on file    Inability: Not on file  . Transportation needs    Medical: Not on file    Non-medical: Not on file  Tobacco Use  . Smoking status: Never Smoker  . Smokeless tobacco: Never Used  Substance and Sexual Activity  . Alcohol use: Yes    Comment: socially  . Drug use: Not on file  . Sexual activity: Not on file  Lifestyle  . Physical activity    Days per week: Not on file    Minutes per session: Not on file  . Stress: Not on file  Relationships  . Social Musicianconnections    Talks on phone: Not on file    Gets together: Not on file    Attends religious service: Not on file    Active member of club or organization: Not on file    Attends meetings of clubs or organizations: Not on file    Relationship status: Not on file  Other Topics Concern  . Not on file  Social History Narrative  . Not on file   Past Surgical History:  Procedure Laterality Date  . CESAREAN SECTION    . COLONOSCOPY W/ POLYPECTOMY    . DILATION AND CURETTAGE OF UTERUS    . MICROLARYNGOSCOPY WITH CO2 LASER AND EXCISION OF VOCAL CORD LESION N/A 03/19/2015  Procedure: MICROLARYNGOSCOPY WITH CO2 LASER AND EXCISION OF VOCAL CORD LESION;  Surgeon: Christia Readingwight Bates, MD;  Location: [facility] OR;  Service: ENT;  Laterality: N/A;  Micro Direct Laryngoscopy with co2 laser excision of bilateral vocal cord granulomas/jet venturi ventilation  . RHINOPLASTY    . SEPTOPLASTY    . vocal cord surgery     granulomas removed after intubation d/t suicide attempt   Past Medical History:  Diagnosis Date  . Anxiety   . Bipolar disorder (HCC)   . Blood clots in brain    d/t traumatic brain injury Aug 2016  . Chronic kidney disease    follows up with Dr. Lequita HaltLufadeju: nephrologist in [place]. Pt cannot state what is wrong with her kidneys; just that she has issues with them. (numerous renal cysts on 01/2015 CT)  . Depression    behavioral health admission 2009 or 2010  . H/O suicide attempt 2009  . H/O transfusion  of packed red blood cells   . Osteoporosis   . Traumatic brain injury [facility](HCC)    with loss of consciousness, [facility]ated, [facility], closed, hemorrhage   BP 100/67   Pulse 75   Temp 98.4 F (36.9 C)   Ht 5\' 2"  (1.575 m)   Wt 104 lb (47.2 kg)   LMP  (LMP Unknown) Comment: trauma  SpO2 98%   BMI 19.02 kg/m   Opioid Risk Score:   Fall Risk Score:  `1  Depression screen PHQ 2/9  Depression screen [facility] 2/9 01/22/2019 05/27/2018 01/23/2018 04/10/2017 03/30/2015  Decreased Interest 1 1 0 1 1  Down, Depressed, Hopeless 1 1 0 1 2  PHQ - 2 Score 2 2 0 2 3  Altered sleeping - - - - 2  Tired, decreased energy - - - - 2  Change in appetite - - - - 1  Feeling bad or failure about yourself  - - - - 2  Trouble concentrating - - - - 2  Moving slowly or fidgety/restless - - - - 2  Suicidal thoughts - - - - 1  PHQ-9 Score - - - - 15  Difficult doing work/chores - - - - Very difficult    Review of Systems     Objective:   Physical Exam  General: No acute distress HEENT: EOMI, oral membranes moist Cards: reg rate  Chest: normal effort Abdomen: Soft, NT, ND Skin: dry, intact Extremities: no edema Neuro:Oriented x 3. movess all 4's. Normal sensation. Good insight and awareness. Attention is functional for conversation. Memory intact.  Musculoskeletal:normal effort. Psych:Pleasant. Quite focused on psychosocial and financial details of her family situation  Assessment & Plan:  1. Traumatic brain injury 01/31/15 after fall --SAH, Right frontal contusion, Right middle cranial fossa EDH  -pt with subsequent cognitive/behavioral deficits post injury  -improvedawareness and cognition.  -attention and anxiety are more manageable 2. Post traumatic headaches--improved.Can increase with stress,fatigue 3. Hx of bipolar disorder 4. Neurogenic bladder. resolved 5. BPPV 6. Nephrogenic diabetes insipidus    Plan:  1. Continue withneuropsychology follow up forcoping  strategies   -needs to work on delegating some tasks and not trying to be responsible for everything in her life.  2. Lamictal, lithium, for her bipolar disorder per psych -Continue xanax at 0.5mg  qhs with a prn dose if needed.           -off klonopin 3. Trazodone 100mg  qhs.Refilled today. 4.relaxation techniques, spiritual respites as she's doing 5.will hold off on further adderall at this point. She is  able to cope better on her own.  6. Continue gaze stabilization exercisesas maintenance for vestibular sx 7. Diabetes insipidus: lithium reduction has helpedwith this   Fifteen minutes of face to face patient care time were spent during this visit. All questions were encouraged and answered.  Follow up with me in 6 months .

## 2019-03-31 ENCOUNTER — Encounter: Payer: Medicare HMO | Admitting: Psychology

## 2019-04-09 ENCOUNTER — Emergency Department (HOSPITAL_COMMUNITY)
Admission: EM | Admit: 2019-04-09 | Discharge: 2019-04-09 | Disposition: A | Discharge status: Home / Self Care | Payer: Medicare HMO | Attending: Emergency Medicine | Admitting: Emergency Medicine

## 2019-04-09 ENCOUNTER — Encounter (HOSPITAL_COMMUNITY): Payer: Self-pay | Admitting: Obstetrics and Gynecology

## 2019-04-09 ENCOUNTER — Other Ambulatory Visit: Payer: Self-pay

## 2019-04-09 ENCOUNTER — Emergency Department (HOSPITAL_COMMUNITY): Payer: Medicare HMO

## 2019-04-09 DIAGNOSIS — N189 Chronic kidney disease, unspecified: Secondary | ICD-10-CM | POA: Diagnosis not present

## 2019-04-09 DIAGNOSIS — Z79899 Other long term (current) drug therapy: Secondary | ICD-10-CM | POA: Diagnosis not present

## 2019-04-09 DIAGNOSIS — R0789 Other chest pain: Secondary | ICD-10-CM | POA: Diagnosis present

## 2019-04-09 DIAGNOSIS — R079 Chest pain, unspecified: Secondary | ICD-10-CM

## 2019-04-09 LAB — HEPATIC FUNCTION PANEL
ALT: 15 U/L (ref 0–44)
AST: 18 U/L (ref 15–41)
Albumin: 4.5 g/dL (ref 3.5–5.0)
Alkaline Phosphatase: 76 U/L (ref 38–126)
Bilirubin, Direct: 0.1 mg/dL (ref 0.0–0.2)
Indirect Bilirubin: 0.5 mg/dL (ref 0.3–0.9)
Total Bilirubin: 0.6 mg/dL (ref 0.3–1.2)
Total Protein: 7.5 g/dL (ref 6.5–8.1)

## 2019-04-09 LAB — BASIC METABOLIC PANEL
Anion gap: 7 (ref 5–15)
BUN: 17 mg/dL (ref 6–20)
CO2: 24 mmol/L (ref 22–32)
Calcium: 9.7 mg/dL (ref 8.9–10.3)
Chloride: 108 mmol/L (ref 98–111)
Creatinine, Ser: 1.6 mg/dL — ABNORMAL HIGH (ref 0.44–1.00)
GFR calc Af Amer: 42 mL/min — ABNORMAL LOW (ref >60)
GFR calc non Af Amer: 36 mL/min — ABNORMAL LOW (ref >60)
Glucose, Bld: 99 mg/dL (ref 70–99)
Potassium: 3.9 mmol/L (ref 3.5–5.1)
Sodium: 139 mmol/L (ref 135–145)

## 2019-04-09 LAB — CBC
HCT: 38.3 % (ref 36.0–46.0)
Hemoglobin: 11.8 g/dL — ABNORMAL LOW (ref 12.0–15.0)
MCH: 29.4 pg (ref 26.0–34.0)
MCHC: 30.8 g/dL (ref 30.0–36.0)
MCV: 95.5 fL (ref 80.0–100.0)
Platelets: 230 10*3/uL (ref 150–400)
RBC: 4.01 MIL/uL (ref 3.87–5.11)
RDW: 13.4 % (ref 11.5–15.5)
WBC: 7 10*3/uL (ref 4.0–10.5)
nRBC: 0 % (ref 0.0–0.2)

## 2019-04-09 LAB — TROPONIN I (HIGH SENSITIVITY)
Troponin I (High Sensitivity): 2 ng/L (ref <18)
Troponin I (High Sensitivity): 2 ng/L (ref <18)

## 2019-04-09 LAB — LIPASE, BLOOD: Lipase: 44 U/L (ref 11–51)

## 2019-04-09 LAB — I-STAT BETA HCG BLOOD, ED (NOT ORDERABLE): I-stat hCG, quantitative: <5 m[IU]/mL (ref <5)

## 2019-04-09 MED ORDER — PANTOPRAZOLE SODIUM 40 MG PO TBEC: 40.0000 mg | DELAYED_RELEASE_TABLET | Freq: Every day | ORAL | 0 refills | Status: AC

## 2019-04-09 MED ORDER — SODIUM CHLORIDE 0.9% FLUSH: 3.0000 mL | Freq: Once | INTRAVENOUS | Status: DC

## 2019-04-09 MED ORDER — PANTOPRAZOLE SODIUM 40 MG PO TBEC
40.0000 mg | DELAYED_RELEASE_TABLET | Freq: Once | ORAL | Status: AC
  Administered 2019-04-09: 40 mg via ORAL
  Filled 2019-04-09: qty 1

## 2019-04-09 MED ORDER — FAMOTIDINE IN NACL 20-0.9 MG/50ML-% IV SOLN
20.0000 mg | Freq: Once | INTRAVENOUS | Status: AC
  Administered 2019-04-09: 20 mg via INTRAVENOUS
  Filled 2019-04-09: qty 50

## 2019-04-09 MED ORDER — FAMOTIDINE 20 MG PO TABS: 20.0000 mg | ORAL_TABLET | Freq: Two times a day (BID) | ORAL | 0 refills | Status: AC

## 2019-04-09 MED ORDER — ALUM & MAG HYDROXIDE-SIMETH 200-200-20 MG/5ML PO SUSP
30.0000 mL | Freq: Once | ORAL | Status: AC
  Administered 2019-04-09: 30 mL via ORAL
  Filled 2019-04-09: qty 30

## 2019-04-09 MED ORDER — SODIUM CHLORIDE 0.9 % IV BOLUS
500.0000 mL | Freq: Once | INTRAVENOUS | Status: AC
  Administered 2019-04-09: 500 mL via INTRAVENOUS

## 2019-04-09 MED ORDER — LIDOCAINE VISCOUS HCL 2 % MT SOLN
15.0000 mL | Freq: Once | OROMUCOSAL | Status: AC
  Administered 2019-04-09: 15 mL via ORAL
  Filled 2019-04-09: qty 15

## 2019-04-09 NOTE — ED Notes (Signed)
Repeat EKG handed to provider

## 2019-04-09 NOTE — Discharge Instructions (Addendum)
You were seen in the emergency department today for chest pain. Your work-up in the emergency department has been overall reassuring. Your labs have been fairly normal and or similar to previous blood work you have had done. Your blood counts show anemia and your kidney function was a bit elevated- each of these are similar to prior blood work on record. Your EKG and the enzyme we use to check your heart did not show an acute heart attack at this time. Your chest x-ray was normal. Your gallbladder ultrasound showed a polyp but no other acute findings.   We are sending you home with Pepcid to take twice per day and Protonix to take daily to help with possible acidity build up. Please take these as prescribed.   We have prescribed you new medication(s) today. Discuss the medications prescribed today with your pharmacist as they can have adverse effects and interactions with your other medicines including over the counter and prescribed medications. Seek medical evaluation if you start to experience new or abnormal symptoms after taking one of these medicines, seek care immediately if you start to experience difficulty breathing, feeling of your throat closing, facial swelling, or rash as these could be indications of a more serious allergic reaction   We would like you to follow up closely with your primary care provider and/or the cardiologist provided in your discharge instructions within 1-3 days. Return to the ER immediately should you experience any new or worsening symptoms including but not limited to return of pain, worsened pain, vomiting, shortness of breath, dizziness, lightheadedness, passing out, or any other concerns that you may have.

## 2019-04-09 NOTE — ED Triage Notes (Signed)
Patient reports she feels like she was punched in the chest. Patient reports her chest pain started yesterday and she was concerned but assumed it was GERD or anxiety. Patient reports the pain has been getting worse. Patient's PCP told her if it got worse to come here. Patient reports hx of heart disease in her family

## 2019-04-09 NOTE — ED Notes (Signed)
Ultrasound in room

## 2019-04-09 NOTE — ED Provider Notes (Signed)
[facility]-EMERGENCY DEPT Provider Note   CSN: [id number] Arrival date & time: 04/09/19  1543     History   Chief Complaint Chief Complaint  Patient presents with  . Chest Pain    HPI Laurie Booker is a 53 y.o. female with a hx of anxiety, bipolar disorder, prior TBI, SAH, & BPPV who presents to the ED w/ complaints of chest discomfort for the past 2 days.  Patient states that pain is located in the substernal chest/epigastric region, it feels like pressure/squeezing and like she cannot get enough air, it occurs intermittently without specific triggers or alleviating/aggravating factors.  No significant change with exertion or with a deep breath.  No significant change with eating.  States it almost feels like she is hungry when asked about nausea/vomiting.  She states that she has family history of CAD with multiple family members requiring bypass procedures in their 11s, she thinks one brother may have required stents prior to age 53.  She has a history of hyperlipidemia.  She denies history of hypertension, diabetes, tobacco use, alcohol use, or drug abuse.  She denies lightheadedness, dizziness, diaphoresis, cough, leg pain/swelling, hemoptysis, recent surgery/trauma, recent long travel, hormone use, personal hx of cancer, or hx of DVT/PE.      HPI  Past Medical History:  Diagnosis Date  . Anxiety   . Bipolar disorder (HCC)   . Blood clots in brain    d/t traumatic brain injury Aug 2016  . Chronic kidney disease    follows up with Dr. Lequita Halt: nephrologist in [place]. Pt cannot state what is wrong with her kidneys; just that she has issues with them. (numerous renal cysts on 01/2015 CT)  . Depression    behavioral health admission 2009 or 2010  . H/O suicide attempt 2009  . H/O transfusion of packed red blood cells   . Osteoporosis   . Traumatic brain injury [facility])    with loss of consciousness, intubated, at [facility] August 2016, closed, hemorrhage    Patient  Active Problem List   Diagnosis Date Noted  . 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 BPPV (benign paroxysmal positional vertigo) 06/02/2015  . Traumatic brain injury with loss of consciousness (HCC) 03/30/2015  . 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 Bipolar disorder (HCC) 03/30/2015  . 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 SAH (subarachnoid hemorrhage) (HCC) 01/31/2015    Past Surgical History:  Procedure Laterality Date  . CESAREAN SECTION    . COLONOSCOPY W/ POLYPECTOMY    . DILATION AND CURETTAGE OF UTERUS    . MICROLARYNGOSCOPY WITH CO2 LASER AND EXCISION OF VOCAL CORD LESION N/A 03/19/2015   Procedure: MICROLARYNGOSCOPY WITH CO2 LASER AND EXCISION OF VOCAL CORD LESION;  Surgeon: Christia Reading, MD;  Location: [facility] OR;  Service: ENT;  Laterality: N/A;  Micro Direct Laryngoscopy with co2 laser excision of bilateral vocal cord granulomas/jet venturi ventilation  . RHINOPLASTY    . SEPTOPLASTY    . vocal cord surgery     granulomas removed after intubation d/t suicide attempt     OB History   No obstetric history on file.      Home Medications    Prior to Admission medications   Medication Sig Start Date End Date Taking? Authorizing Provider  acetaminophen (TYLENOL) 325 MG tablet Take 1-2 tablets (325-650 mg total) by mouth every 6 (six) hours as needed for fever, headache, mild pain  or moderate pain. 02/05/15  Yes Riebock, Emina, NP  ALPRAZolam (XANAX) 0.5 MG tablet TAKE ONE TABLET BY MOUTH TWICE A DAY SCHEDULED AND TAKE ONE TABLET BY MOUTH DAILY AS NEEDED FOR ANXIETY Patient taking differently: Take 0.5 mg by mouth at bedtime as needed for anxiety.  01/22/19  Yes Ranelle Oyster, MD  atorvastatin (LIPITOR) 40 MG tablet Take 40 mg by mouth at bedtime.  11/09/16  Yes [provider]  Cholecalciferol (VITAMIN D3) 5000 UNITS CAPS Take 1 capsule by mouth daily.   Yes  [provider]  lamoTRIgine (LAMICTAL) 100 MG tablet Take 100 mg by mouth at bedtime.    Yes [provider]  lithium 300 MG tablet Take 600 mg by mouth at bedtime.    Yes [provider]  traZODone (DESYREL) 100 MG tablet TAKE ONE TABLET BY MOUTH AT BEDTIME FOR SLEEP Patient taking differently: Take 100 mg by mouth at bedtime.  10/15/18  Yes Ranelle Oyster, MD  vitamin B-12 (CYANOCOBALAMIN) 500 MCG tablet Take 500 mcg by mouth at bedtime.   Yes [provider]  famotidine (PEPCID) 20 MG tablet Take 1 tablet (20 mg total) by mouth 2 (two) times daily. 04/09/19   Petrucelli, Samantha R, PA-C  pantoprazole (PROTONIX) 40 MG tablet Take 1 tablet (40 mg total) by mouth daily. 04/09/19   Petrucelli, Pleas Koch, PA-C    Family History History reviewed. No pertinent family history.  Social History Social History   Tobacco Use  . Smoking status: Never Smoker  . Smokeless tobacco: Never Used  Substance Use Topics  . Alcohol use: Yes    Comment: socially  . Drug use: Not Currently     Allergies   Patient has no known allergies.   Review of Systems Review of Systems Constitutional: Negative for chills, diaphoresis and fever.  Respiratory: Positive for shortness of breath (feels like cannot get a deep breath). Negative for cough.   Cardiovascular: Positive for chest pain. Negative for palpitations and leg swelling.  Gastrointestinal: Positive for abdominal pain. Negative for blood in stool, constipation, diarrhea, nausea and vomiting.  Genitourinary: Negative for dysuria.  Neurological: Negative for dizziness, syncope, weakness, light-headedness, numbness and headaches.  All other systems reviewed and are negative.  Physical Exam Updated Vital Signs BP 137/82 (BP Location: Right Arm)   Pulse 63   Temp 98.8 F (37.1 C) (Oral)   Resp 17   LMP  (LMP Unknown) Comment: trauma  SpO2 98%   Physical Exam Vitals signs and nursing note reviewed.   Constitutional:      General: She is not in acute distress.    Appearance: She is well-developed. She is not toxic-appearing.  HENT:     Head: Normocephalic and atraumatic.  Eyes:     General:        Right eye: No discharge.        Left eye: No discharge.     Conjunctiva/sclera: Conjunctivae normal.  Neck:     Musculoskeletal: Neck supple.  Cardiovascular:     Rate and Rhythm: Normal rate and regular rhythm.     Pulses:          Radial pulses are 2+ on the right side and 2+ on the left side.  Pulmonary:     Effort: Pulmonary effort is normal. No respiratory distress.     Breath sounds: Normal breath sounds. No wheezing, rhonchi or rales.  Chest:     Chest wall: Tenderness (mild sternal) present.  Abdominal:  General: There is no distension.     Palpations: Abdomen is soft.     Tenderness: There is abdominal tenderness in the epigastric area. There is no guarding or rebound. Negative signs include Murphy's sign.  Musculoskeletal:     Right lower leg: She exhibits no tenderness. No edema.     Left lower leg: She exhibits no tenderness. No edema.  Skin:    General: Skin is warm and dry.     Findings: No rash.  Neurological:     Mental Status: She is alert.     Comments: Clear speech.   Psychiatric:        Behavior: Behavior normal.   ED Treatments / Results  Labs (all labs ordered are listed, but only abnormal results are displayed) Labs Reviewed  BASIC METABOLIC PANEL - Abnormal; Notable for the following components:      Result Value   Creatinine, Ser 1.60 (*)    GFR calc non Af Amer 36 (*)    GFR calc Af Amer 42 (*)    All other components within normal limits  CBC - Abnormal; Notable for the following components:   Hemoglobin 11.8 (*)    All other components within normal limits  LIPASE, BLOOD  HEPATIC FUNCTION PANEL  I-STAT BETA HCG BLOOD, ED ([facility], [facility], [facility] ONLY)  I-STAT BETA HCG BLOOD, ED (NOT ORDERABLE)  TROPONIN I (HIGH SENSITIVITY)  TROPONIN I (HIGH  SENSITIVITY)    EKG EKG Interpretation  Date/Time:  Wednesday April 09 2019 15:48:34 EDT Ventricular Rate:  64 PR Interval:    QRS Duration: 94 QT Interval:  411 QTC Calculation: 424 R Axis:   75 Text Interpretation:  Sinus rhythm Borderline prolonged PR interval Artifact besides artifact, appears similar to 2016 Confirmed by Sherwood Gambler [phone]) on 04/09/2019 6:04:34 PM   Radiology Dg Chest 2 View  Result Date: 04/09/2019 CLINICAL DATA:  Chest pain. EXAM: CHEST - 2 VIEW COMPARISON:  February 04, 2015. FINDINGS: The heart size and mediastinal contours are within normal limits. Both lungs are clear. No pneumothorax or pleural effusion is noted. The visualized skeletal structures are unremarkable. IMPRESSION: No active cardiopulmonary disease. Electronically Signed   By: Marijo Conception M.D.   On: 04/09/2019 16:25   US Abdomen Limited Ruq  Result Date: 04/09/2019 CLINICAL DATA:  Chest and mid abdominal pain since yesterday with progression. EXAM: ULTRASOUND ABDOMEN LIMITED RIGHT UPPER QUADRANT COMPARISON:  CT of the abdomen and pelvis 01/31/2015 FINDINGS: Gallbladder: No gallstones or wall thickening visualized. No sonographic Murphy sign noted by sonographer. Common bile duct: Diameter: 3 mm, within normal limits. Liver: Liver parenchyma is of normal echotexture. A 6 mm cyst is noted adjacent to the gallbladder. No other focal lesions are present. Portal vein is patent on color Doppler imaging with normal direction of blood flow towards the liver. Other: None. IMPRESSION: 1. No acute abnormality. 2. Benign appearing 6 mm cyst adjacent to the gallbladder. 3. Electronically Signed   By: San Morelle M.D.   On: 04/09/2019 19:29    Procedures Procedures (including critical care time)  Medications Ordered in ED Medications  sodium chloride flush (NS) 0.9 % injection 3 mL (has no administration in time range)  pantoprazole (PROTONIX) EC tablet 40 mg (has no administration in  time range)  alum & mag hydroxide-simeth (MAALOX/MYLANTA) 200-200-20 MG/5ML suspension 30 mL (30 mLs Oral Given 04/09/19 1859)    And  lidocaine (XYLOCAINE) 2 % viscous mouth solution 15 mL (15 mLs Oral Given 04/09/19 1859)  sodium chloride 0.9 % bolus 500 mL (500 mLs Intravenous New Bag/Given 04/09/19 1858)  famotidine (PEPCID) IVPB 20 mg premix (20 mg Intravenous New Bag/Given 04/09/19 1858)     Initial Impression / Assessment and Plan / ED Course  I have reviewed the triage vital signs and the nursing notes.  Pertinent labs & imaging results that were available during my care of the patient were reviewed by me and considered in my medical decision making (see chart for details).    Patient presents to the emergency department with chest pain. Patient nontoxic appearing, in no apparent distress, vitals without significant abnormality- BP elevated and mild bradycardia- each normalized w/ repeat vitals. Some reproducibility with chest wall & more so epigastrium palpation, no peritoneal signs. DDX: ACS, pulmonary embolism, dissection, pneumothorax, pneumonia, arrhythmia, anemia, electrolyte derangement, MSK, GERD, anxiety, cholecystitis, pancreatitis. Evaluation initiated with labs, EKG, and CXR. Patient on cardiac monitor.   Work-up in the ER reviewed:  CBC: Mild anemia similar to prior. No leukocytosis.  BMP: Creatinine elevated @ 1.60- most recent on record on care everywhere is 1.53 so not significant change.  Hepatic function panel: WNL Lipase: WNL Troponin: 2, 2 EKG: No STEMI x 2 w/ initial & repeat.  CXR: Negative, without infiltrate, effusion, pneumothorax, or fracture/dislocation.  RUQ US:  1. No acute abnormality. 2. Benign appearing 6 mm cyst adjacent to the gallbladder.   Heart pathway score 2- troponin 2, 2- no significant elevation or delta elevation, EKG without STEMI- doubt ACS. Low risk wells- not hypoxic/tachycardic- doubt PE. Pain is not a tearing sensation, symmetric  pulses, no widening of mediastinum on CXR, doubt dissection. Abdominal labs reassuring, RUQ negative, no peritoneal signs- not consistent w/ pancreatitis or cholecystitis. Unclear overall etiology. Patient has appeared hemodynamically stable throughout ER visit and appears safe for discharge with close PCP/cardiology follow up, will trial pepcid & protonix. I discussed results, treatment plan, need for  follow-up, and return precautions with the patient & her daughter @ bedside. Provided opportunity for questions, patient & her daughter confirmed understanding and are in agreement with plan.   Findings and plan of care discussed with supervising physician Dr. Criss AlvineGoldston who has evaluated patient throughout encounter & is in agreement.    Final Clinical Impressions(s) / ED Diagnoses   Final diagnoses:  Chest pain    ED Discharge Orders         Ordered    famotidine (PEPCID) 20 MG tablet  2 times daily     04/09/19 2039    pantoprazole (PROTONIX) 40 MG tablet  Daily     04/09/19 2039           Cherly Andersonetrucelli, Samantha R, PA-C 04/09/19 2054    Pricilla LovelessGoldston, Scott, MD 04/10/19 1511

## 2019-04-10 ENCOUNTER — Encounter: Payer: Medicare HMO | Admitting: Psychology

## 2019-04-23 ENCOUNTER — Telehealth: Payer: Self-pay

## 2019-04-23 NOTE — Telephone Encounter (Signed)
Patient called stating she is moving to [place] on Nov. 11 th and she is worried about getting her medication. According to medication list she is prescribed Alprazolam and Trazodone by this office. Alprazolam last filled 04/17/2019 and Trazodone 04/11/2019 for a 60 day supply. Please advise.

## 2019-04-23 NOTE — Telephone Encounter (Signed)
Is she asking for refills before she leaves?

## 2019-04-24 NOTE — Telephone Encounter (Signed)
She is concerned about not finding a neurologist when she gets to [place] and running out of medication. She would like to know if you are willing to send a refill to a pharmacy in [place] when she needs a refill in the next month or two. She will call back to give pharmacy information. Her main refill would be Trazodone, takes Alprazolam as needed.

## 2019-04-24 NOTE — Telephone Encounter (Signed)
Laurie Booker returned call for [facility].

## 2019-04-25 NOTE — Telephone Encounter (Signed)
Yes, I would be ok doing that

## 2019-04-25 NOTE — Telephone Encounter (Signed)
Pt.notified

## 2019-07-17 ENCOUNTER — Other Ambulatory Visit: Payer: Self-pay | Admitting: Physical Medicine & Rehabilitation

## 2019-07-17 DIAGNOSIS — G44309 Post-traumatic headache, unspecified, not intractable: Secondary | ICD-10-CM

## 2019-07-17 DIAGNOSIS — F3170 Bipolar disorder, currently in remission, most recent episode unspecified: Secondary | ICD-10-CM

## 2019-07-17 DIAGNOSIS — S069X3S Unspecified intracranial injury with loss of consciousness of 1 hour to 5 hours 59 minutes, sequela: Secondary | ICD-10-CM

## 2019-07-23 ENCOUNTER — Encounter: Payer: Medicare HMO | Admitting: Physical Medicine & Rehabilitation

## 2019-12-02 ENCOUNTER — Other Ambulatory Visit: Payer: Self-pay | Admitting: Physical Medicine & Rehabilitation

## 2019-12-02 DIAGNOSIS — F3170 Bipolar disorder, currently in remission, most recent episode unspecified: Secondary | ICD-10-CM

## 2019-12-02 DIAGNOSIS — G44309 Post-traumatic headache, unspecified, not intractable: Secondary | ICD-10-CM

## 2019-12-02 DIAGNOSIS — S069X3S Unspecified intracranial injury with loss of consciousness of 1 hour to 5 hours 59 minutes, sequela: Secondary | ICD-10-CM

## 2019-12-03 NOTE — Telephone Encounter (Signed)
Last RF without appt

## 2020-04-23 ENCOUNTER — Other Ambulatory Visit: Payer: Self-pay | Admitting: Physical Medicine & Rehabilitation

## 2020-04-23 DIAGNOSIS — S069X3S Unspecified intracranial injury with loss of consciousness of 1 hour to 5 hours 59 minutes, sequela: Secondary | ICD-10-CM

## 2020-04-23 DIAGNOSIS — G44309 Post-traumatic headache, unspecified, not intractable: Secondary | ICD-10-CM

## 2020-04-23 DIAGNOSIS — F3170 Bipolar disorder, currently in remission, most recent episode unspecified: Secondary | ICD-10-CM

## 2020-05-14 IMAGING — US US ABDOMEN LIMITED
1 series · 14 of 25 positions shown · non-contrast
Comparison: CT of the abdomen and pelvis 01/31/2015

CLINICAL DATA: Chest and mid abdominal pain since yesterday with
progression.

EXAM:
ULTRASOUND ABDOMEN LIMITED RIGHT UPPER QUADRANT

[Series 1: us abdomen limited · 14 of 93 slices shown]
[im 1/93]
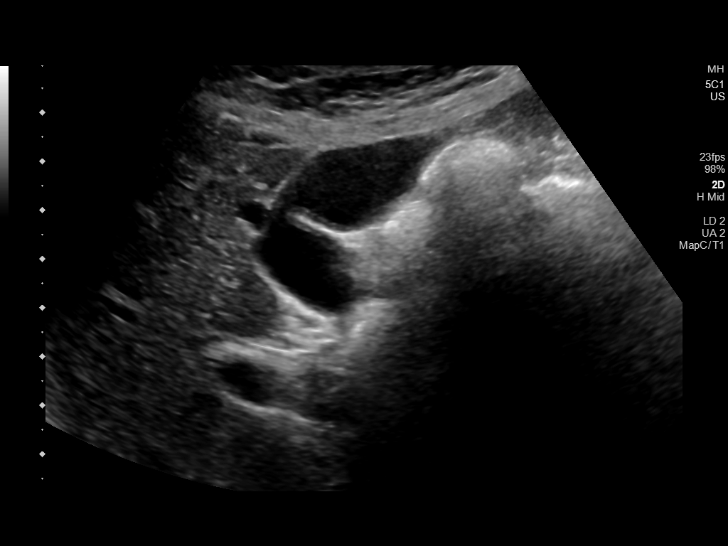
[im 8/93]
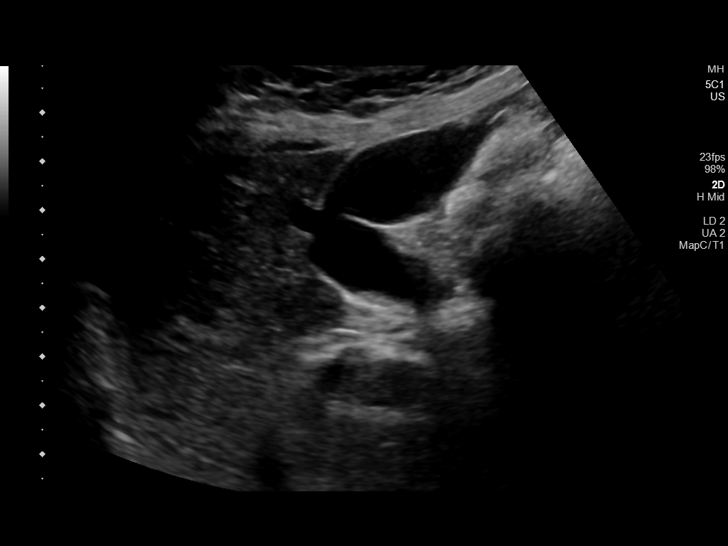
[im 16/93]
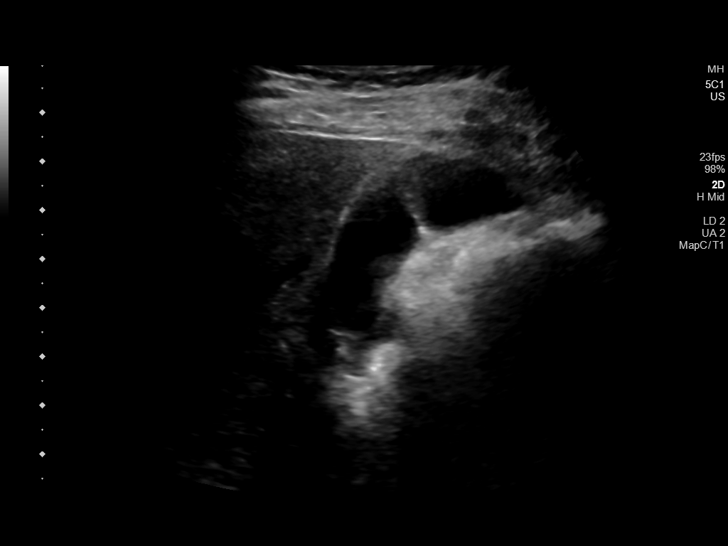
[im 24/93]
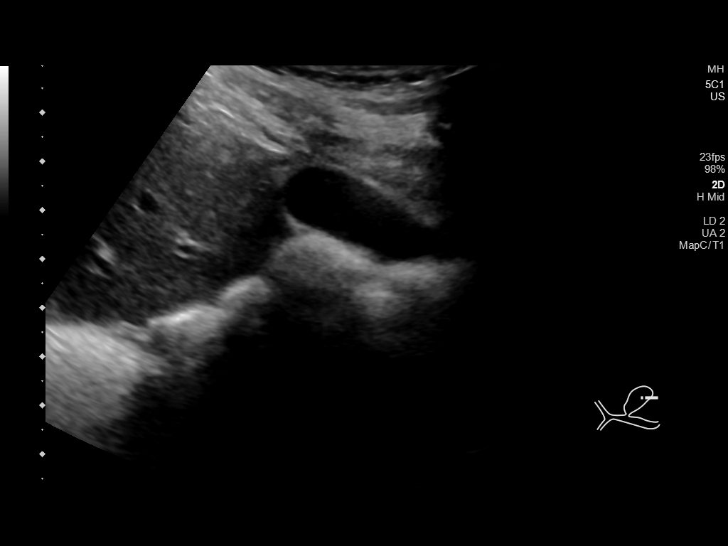
[im 31/93]
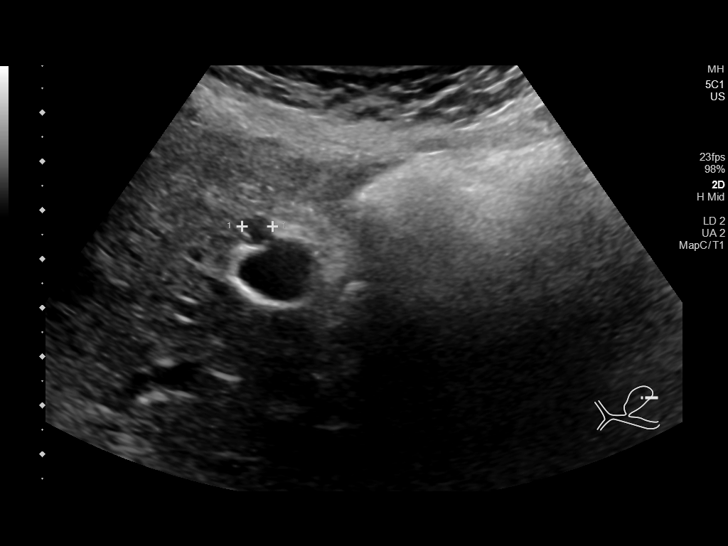
[im 35/93]
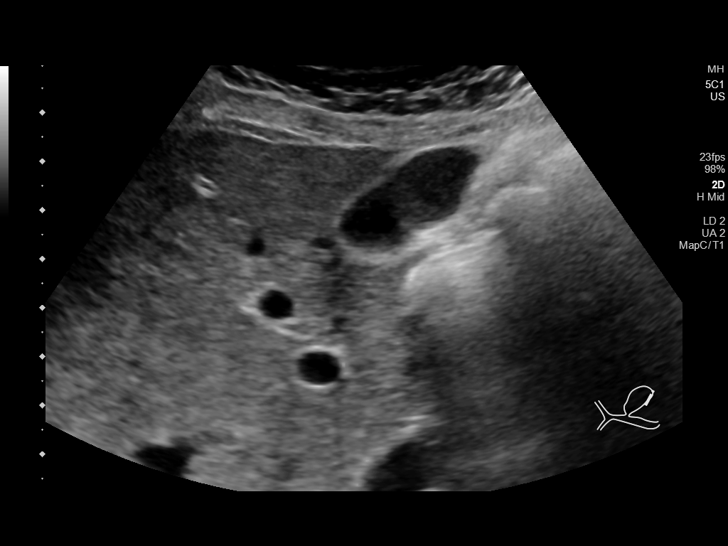
[im 43/93]
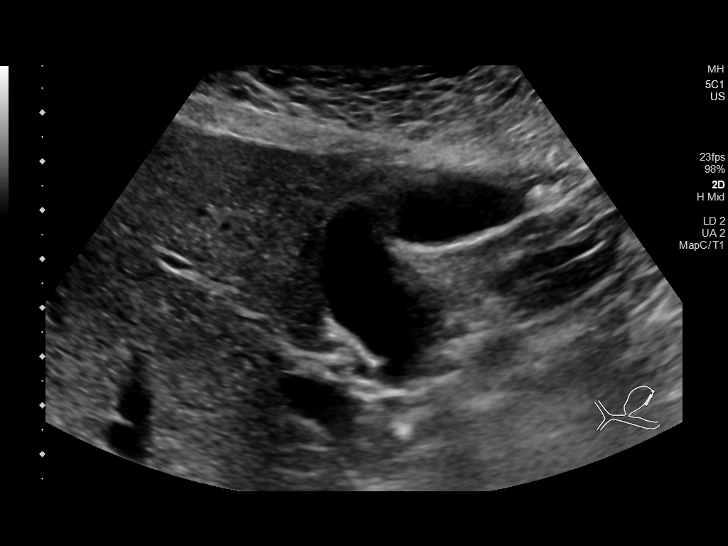
[im 50/93]
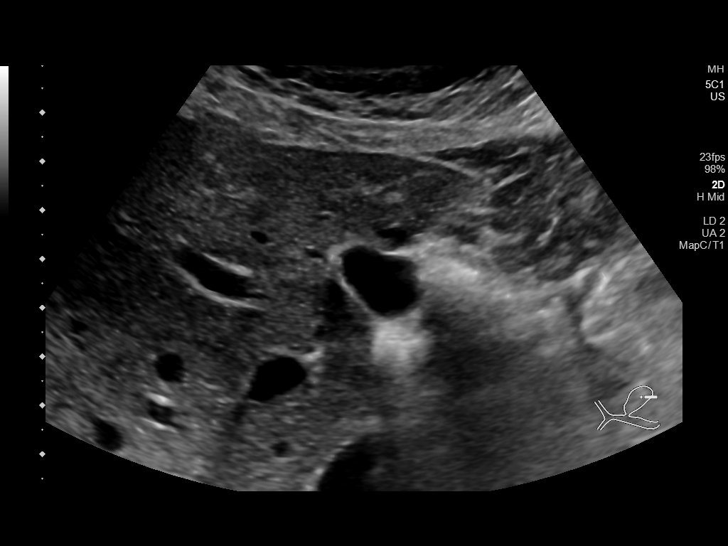
[im 58/93]
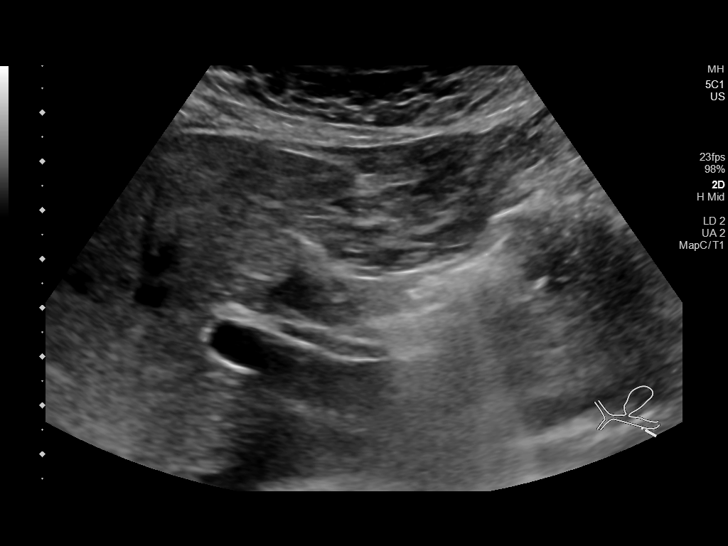
[im 62/93]
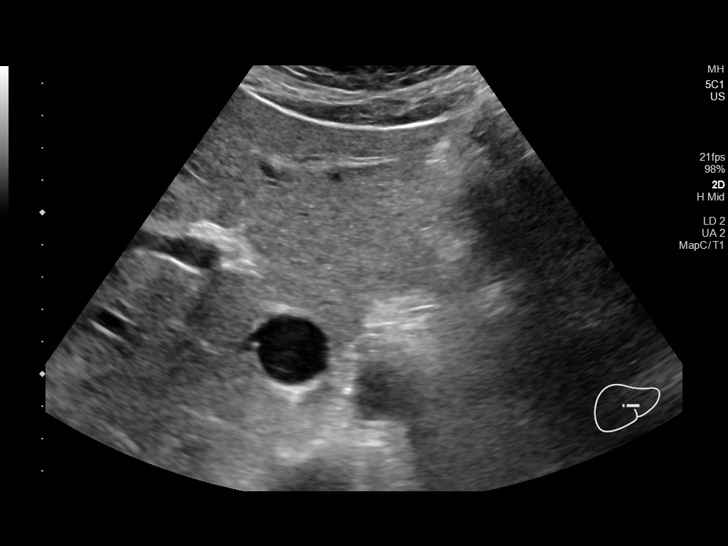
[im 70/93]
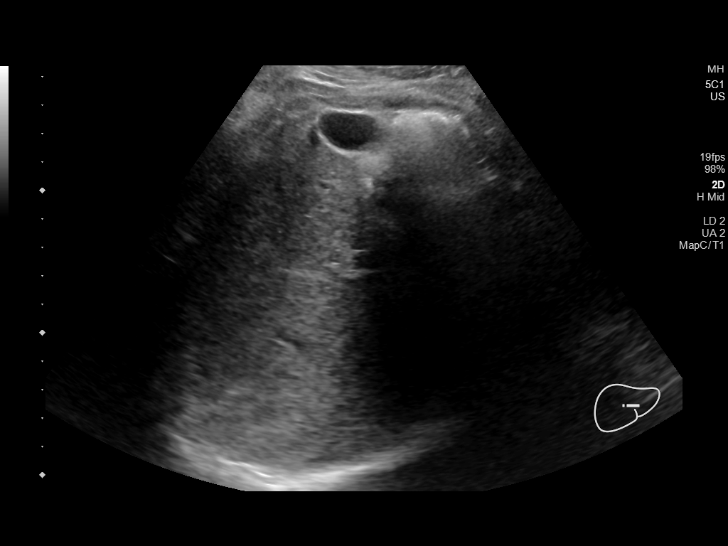
[im 77/93]
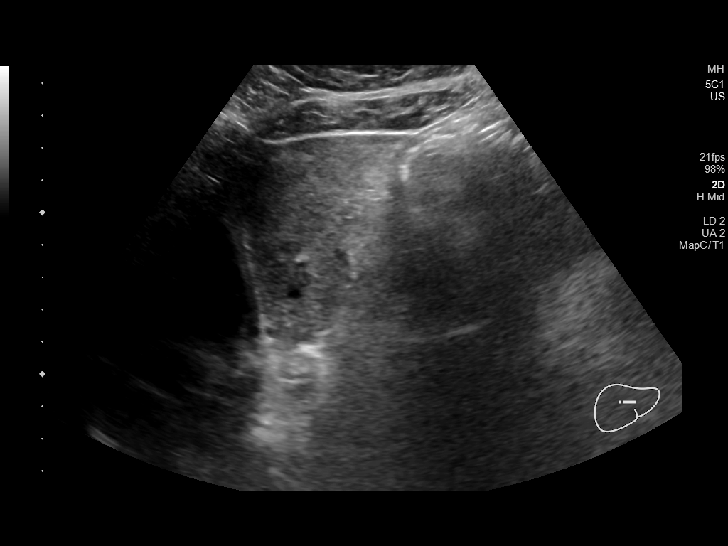
[im 85/93]
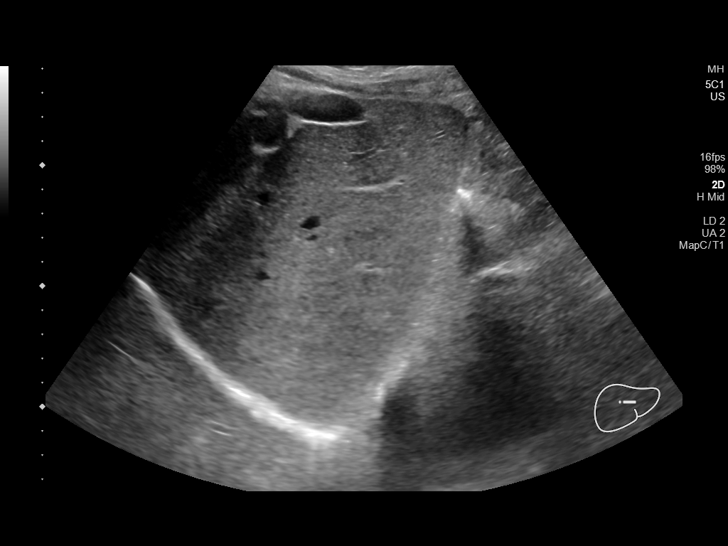
[im 93/93]
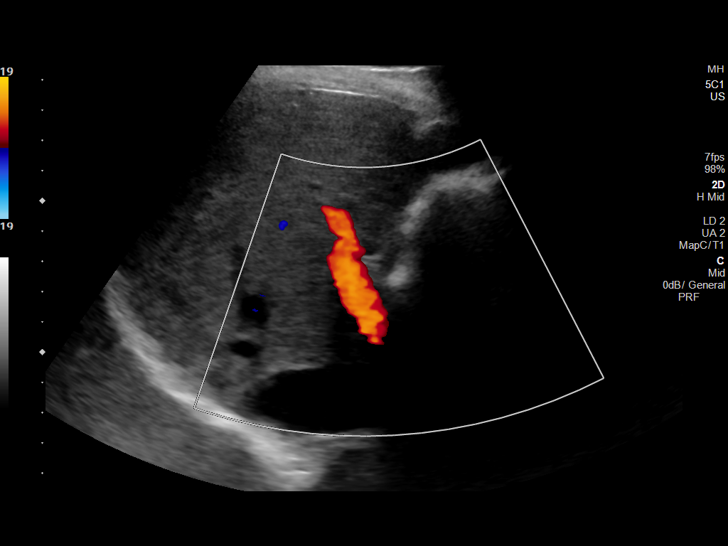

[14 of 25 positions shown; findings below may reference images not displayed]

FINDINGS: Gallbladder:

No gallstones or wall thickening visualized. No sonographic Murphy
sign noted by sonographer.

Common bile duct:

Diameter: 3 mm, within normal limits.

Liver:

Liver parenchyma is of normal echotexture. A 6 mm cyst is noted
adjacent to the gallbladder. No other focal lesions are present.
Portal vein is patent on color Doppler imaging with normal direction
of blood flow towards the liver.

Other: None.
IMPRESSION: 1. No acute abnormality.
2. Benign appearing 6 mm cyst adjacent to the gallbladder.
3.

## 2020-06-26 ENCOUNTER — Other Ambulatory Visit: Payer: Self-pay | Admitting: Physical Medicine & Rehabilitation

## 2020-06-26 DIAGNOSIS — S069X3S Unspecified intracranial injury with loss of consciousness of 1 hour to 5 hours 59 minutes, sequela: Secondary | ICD-10-CM

## 2020-06-26 DIAGNOSIS — F3170 Bipolar disorder, currently in remission, most recent episode unspecified: Secondary | ICD-10-CM

## 2020-06-26 DIAGNOSIS — G44309 Post-traumatic headache, unspecified, not intractable: Secondary | ICD-10-CM

## 2020-10-11 ENCOUNTER — Other Ambulatory Visit: Payer: Self-pay | Admitting: Physical Medicine & Rehabilitation

## 2020-10-11 DIAGNOSIS — F3170 Bipolar disorder, currently in remission, most recent episode unspecified: Secondary | ICD-10-CM

## 2020-10-11 DIAGNOSIS — S069X3S Unspecified intracranial injury with loss of consciousness of 1 hour to 5 hours 59 minutes, sequela: Secondary | ICD-10-CM

## 2020-10-11 DIAGNOSIS — G44309 Post-traumatic headache, unspecified, not intractable: Secondary | ICD-10-CM

## 2020-12-06 ENCOUNTER — Other Ambulatory Visit: Payer: Self-pay | Admitting: Physical Medicine & Rehabilitation

## 2020-12-06 DIAGNOSIS — F3170 Bipolar disorder, currently in remission, most recent episode unspecified: Secondary | ICD-10-CM

## 2020-12-06 DIAGNOSIS — S069X3S Unspecified intracranial injury with loss of consciousness of 1 hour to 5 hours 59 minutes, sequela: Secondary | ICD-10-CM

## 2020-12-06 DIAGNOSIS — G44309 Post-traumatic headache, unspecified, not intractable: Secondary | ICD-10-CM

## 2021-01-15 ENCOUNTER — Other Ambulatory Visit: Payer: Self-pay | Admitting: Physical Medicine & Rehabilitation

## 2021-01-15 DIAGNOSIS — F3170 Bipolar disorder, currently in remission, most recent episode unspecified: Secondary | ICD-10-CM

## 2021-01-15 DIAGNOSIS — S069X3S Unspecified intracranial injury with loss of consciousness of 1 hour to 5 hours 59 minutes, sequela: Secondary | ICD-10-CM

## 2021-01-15 DIAGNOSIS — G44309 Post-traumatic headache, unspecified, not intractable: Secondary | ICD-10-CM
